# Patient Record
Sex: Male | Born: 1955 | Hispanic: No
Health system: Southern US, Community
[De-identification: ages and names within clinical notes are randomized; demographics above are authoritative.]

## PROBLEM LIST (undated history)

## (undated) DIAGNOSIS — K603 Anal fistula, unspecified: Secondary | ICD-10-CM

## (undated) DIAGNOSIS — R5383 Other fatigue: Secondary | ICD-10-CM

## (undated) DIAGNOSIS — D4959 Neoplasm of unspecified behavior of other genitourinary organ: Secondary | ICD-10-CM

## (undated) DIAGNOSIS — R079 Chest pain, unspecified: Secondary | ICD-10-CM

## (undated) DIAGNOSIS — T7840XA Allergy, unspecified, initial encounter: Secondary | ICD-10-CM

## (undated) DIAGNOSIS — R002 Palpitations: Secondary | ICD-10-CM

## (undated) DIAGNOSIS — K5090 Crohn's disease, unspecified, without complications: Secondary | ICD-10-CM

## (undated) DIAGNOSIS — M1990 Unspecified osteoarthritis, unspecified site: Secondary | ICD-10-CM

## (undated) DIAGNOSIS — I499 Cardiac arrhythmia, unspecified: Secondary | ICD-10-CM

## (undated) DIAGNOSIS — R198 Other specified symptoms and signs involving the digestive system and abdomen: Secondary | ICD-10-CM

## (undated) DIAGNOSIS — E785 Hyperlipidemia, unspecified: Secondary | ICD-10-CM

## (undated) DIAGNOSIS — D509 Iron deficiency anemia, unspecified: Secondary | ICD-10-CM

## (undated) DIAGNOSIS — I10 Essential (primary) hypertension: Secondary | ICD-10-CM

## (undated) DIAGNOSIS — Z9889 Other specified postprocedural states: Secondary | ICD-10-CM

## (undated) DIAGNOSIS — H269 Unspecified cataract: Secondary | ICD-10-CM

## (undated) DIAGNOSIS — R21 Rash and other nonspecific skin eruption: Secondary | ICD-10-CM

## (undated) DIAGNOSIS — K611 Rectal abscess: Secondary | ICD-10-CM

## (undated) DIAGNOSIS — K529 Noninfective gastroenteritis and colitis, unspecified: Secondary | ICD-10-CM

## (undated) DIAGNOSIS — K219 Gastro-esophageal reflux disease without esophagitis: Secondary | ICD-10-CM

## (undated) DIAGNOSIS — K605 Anorectal fistula, unspecified: Secondary | ICD-10-CM

## (undated) DIAGNOSIS — L259 Unspecified contact dermatitis, unspecified cause: Secondary | ICD-10-CM

## (undated) DIAGNOSIS — Z23 Encounter for immunization: Secondary | ICD-10-CM

## (undated) DIAGNOSIS — L739 Follicular disorder, unspecified: Secondary | ICD-10-CM

## (undated) DIAGNOSIS — M279 Disease of jaws, unspecified: Secondary | ICD-10-CM

## (undated) DIAGNOSIS — I4891 Unspecified atrial fibrillation: Secondary | ICD-10-CM

## (undated) HISTORY — DX: Other fatigue: R53.83

## (undated) HISTORY — DX: Anorectal fistula: K60.5

## (undated) HISTORY — PX: UPPER GASTROINTESTINAL ENDOSCOPY: SHX188

## (undated) HISTORY — DX: Anal fistula: K60.3

## (undated) HISTORY — DX: Disease of jaws, unspecified: M27.9

## (undated) HISTORY — DX: Allergy, unspecified, initial encounter: T78.40XA

## (undated) HISTORY — DX: Rash and other nonspecific skin eruption: R21

## (undated) HISTORY — DX: Palpitations: R00.2

## (undated) HISTORY — DX: Essential (primary) hypertension: I10

## (undated) HISTORY — DX: Noninfective gastroenteritis and colitis, unspecified: K52.9

## (undated) HISTORY — DX: Neoplasm of unspecified behavior of other genitourinary organ: D49.59

## (undated) HISTORY — PX: COLONOSCOPY: SHX174

## (undated) HISTORY — DX: Gastro-esophageal reflux disease without esophagitis: K21.9

## (undated) HISTORY — DX: Iron deficiency anemia, unspecified: D50.9

## (undated) HISTORY — DX: Unspecified atrial fibrillation: I48.91

## (undated) HISTORY — DX: Crohn's disease, unspecified, without complications: K50.90

## (undated) HISTORY — DX: Anal fistula, unspecified: K60.30

## (undated) HISTORY — DX: Anorectal fistula, unspecified: K60.50

## (undated) HISTORY — DX: Rectal abscess: K61.1

## (undated) HISTORY — DX: Hyperlipidemia, unspecified: E78.5

## (undated) HISTORY — DX: Follicular disorder, unspecified: L73.9

## (undated) HISTORY — DX: Unspecified osteoarthritis, unspecified site: M19.90

## (undated) HISTORY — DX: Other specified postprocedural states: Z98.890

## (undated) HISTORY — DX: Encounter for immunization: Z23

## (undated) HISTORY — DX: Chest pain, unspecified: R07.9

## (undated) HISTORY — DX: Unspecified cataract: H26.9

## (undated) HISTORY — DX: Unspecified contact dermatitis, unspecified cause: L25.9

## (undated) HISTORY — DX: Other specified symptoms and signs involving the digestive system and abdomen: R19.8

---
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
## 2009-11-15 DIAGNOSIS — K5090 Crohn's disease, unspecified, without complications: Secondary | ICD-10-CM

## 2009-11-15 HISTORY — DX: Crohn's disease, unspecified, without complications: K50.90

## 2014-11-15 DIAGNOSIS — I4891 Unspecified atrial fibrillation: Secondary | ICD-10-CM

## 2014-11-15 HISTORY — DX: Unspecified atrial fibrillation: I48.91

## 2015-01-14 ENCOUNTER — Encounter: Payer: Self-pay | Admitting: *Deleted

## 2015-01-14 DIAGNOSIS — I499 Cardiac arrhythmia, unspecified: Secondary | ICD-10-CM

## 2015-01-14 HISTORY — DX: Cardiac arrhythmia, unspecified: I49.9

## 2015-01-15 ENCOUNTER — Other Ambulatory Visit (INDEPENDENT_AMBULATORY_CARE_PROVIDER_SITE_OTHER): Payer: BLUE CROSS/BLUE SHIELD

## 2015-01-15 ENCOUNTER — Encounter: Payer: Self-pay | Admitting: Physician Assistant

## 2015-01-15 ENCOUNTER — Ambulatory Visit (INDEPENDENT_AMBULATORY_CARE_PROVIDER_SITE_OTHER): Payer: BLUE CROSS/BLUE SHIELD | Admitting: Physician Assistant

## 2015-01-15 VITALS — BP 120/70 | HR 72 | Temp 97.4°F | Ht 66.5 in | Wt 205.1 lb

## 2015-01-15 DIAGNOSIS — K50113 Crohn's disease of large intestine with fistula: Secondary | ICD-10-CM

## 2015-01-15 LAB — CBC WITH DIFFERENTIAL/PLATELET
Basophils Absolute: 0 10*3/uL (ref 0.0–0.1)
Basophils Relative: 0.2 % (ref 0.0–3.0)
Eosinophils Absolute: 0 10*3/uL (ref 0.0–0.7)
Eosinophils Relative: 0 % (ref 0.0–5.0)
HCT: 32.8 % — ABNORMAL LOW (ref 39.0–52.0)
Hemoglobin: 11.2 g/dL — ABNORMAL LOW (ref 13.0–17.0)
Lymphocytes Relative: 9.3 % — ABNORMAL LOW (ref 12.0–46.0)
Lymphs Abs: 1.7 10*3/uL (ref 0.7–4.0)
MCHC: 34 g/dL (ref 30.0–36.0)
MCV: 88.4 fl (ref 78.0–100.0)
Monocytes Absolute: 2 10*3/uL — ABNORMAL HIGH (ref 0.1–1.0)
Monocytes Relative: 11 % (ref 3.0–12.0)
Neutro Abs: 14.5 10*3/uL — ABNORMAL HIGH (ref 1.4–7.7)
Neutrophils Relative %: 79.5 % — ABNORMAL HIGH (ref 43.0–77.0)
Platelets: 462 10*3/uL — ABNORMAL HIGH (ref 150.0–400.0)
RBC: 3.71 Mil/uL — ABNORMAL LOW (ref 4.22–5.81)
RDW: 14 % (ref 11.5–15.5)
WBC: 18.3 10*3/uL (ref 4.0–10.5)

## 2015-01-15 MED ORDER — METRONIDAZOLE 500 MG PO TABS: 500.0000 mg | ORAL_TABLET | Freq: Three times a day (TID) | ORAL | Status: DC

## 2015-01-15 MED ORDER — PREDNISONE 10 MG PO TABS: ORAL_TABLET | ORAL | Status: DC

## 2015-01-15 MED ORDER — CIPROFLOXACIN HCL 500 MG PO TABS: 500.0000 mg | ORAL_TABLET | Freq: Two times a day (BID) | ORAL | Status: DC

## 2015-01-15 NOTE — Patient Instructions (Addendum)
Hunter Edwards has advised that you be on a prednisone taper. The taper instructions are as follows: Starting on 01-15-15 take prednisone 10 mg 4 tabs every AM for 7 days.(01-15-15 to 01-21-15) Then take 3.5 tab x 7 days(01-22-15 to 01-28-15) then 3 tabs x 7 days(01-29-15 to 02-04-15) Then 2.5 tabs x 7 days(02-04-25 to 02-11-15) then 2 tabs x 7 days(02-12-15 to 02-18-15) Then 1.5 tabs x 7 days(02-19-15 to 02-25-15) than 1 tab x 7 days(02-26-15 to 03-04-15) Then 0.5 tab x 7 days(03-05-15 to 03-11-15) then stop  Your physician has requested that you go to the basement for  lab work before leaving today  We have sent the following medications to your pharmacy for you to pick up at your convenience: Flagyl and cipro  You have been scheduled for an MRI at [facility] on 01-16-15. Your appointment time is 9:00pm. Please arrive 15 minutes prior to your appointment time for registration purposes. Please make certain not to have anything to eat or drink 6 hours prior to your test. In addition, if you have any metal in your body, have a pacemaker or defibrillator, please be sure to let your ordering physician know. This test typically takes 45 minutes to 1 hour to complete.  Please discontinue Lialda

## 2015-01-15 NOTE — Progress Notes (Addendum)
Patient ID: Hunter Edwards, male   DOB: 1956/05/13, 59 y.o.   MRN: [id number]    HPI:  Hunter Edwards is a 59 y.o.   male is self referred for evaluation of an exacerbation of Crohn's disease.  Hunter Edwards had a colonoscopy in June 2011 by Dr. Lyndel Safe in [place] due to heme positive stools. 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 Dr. Lyndel Safe recommended that the patient have a surgical consult for an exam under anesthesia with possible biopsies and hemorrhoidectomy. The patient began to have rectal pain and diarrhea in the fall of 2014. He initially saw his primary care provider and was referred to Dr. Elissa Hefty of [facility]. He had approximately 8 months of a mucus-like discharge from his rectum. On 10/19/2013, he had a colonoscopy and it was noted that 5 cm of the terminal ileum were intubated and were normal. Focal erythema and mild friability were noted from 50-52 cm from the anal verge, corresponding to the probable descending colon. Otherwise the colonic mucosa appeared normal, with a normal vascular pattern. The distal rectum, just proximal to the anal verge, was abnormal with 2 separate fistulous tracts identified. Multiple biopsies were obtained of the right colon and left colon and rectum and all specimens were sent to pathology to rule out active Crohn's. Small internal hemorrhoids were noted on retroflexed flu of the rectum. The patient was then seen by Dr. Morton Stall of  surgery at [facility] and sent for an MRI of the pelvis with and without contrast which he had performed on 08/29/2013 he was noted to have a possible small intersphincteric tract near the posterior anal verge without evidence of abscess. Patient was treated with antibiotics for a period of time and felt better Hunter Edwards states that Dr. Morton Stall gave him the option of surgery if he would like to proceed and he  deferred surgery due to the risk of developing scarring from the surgery.Marland Kitchen His Crohn's was maintained on Lialda 2.4 g daily.   More recently around February 22, he began to have rectal pain and diarrhea. He says he was unable to get an appointment with his gastroenterologist and so he was seen at a walk-in clinic in [place] where he was started on Flagyl. Several days later he was seen in the [facility] emergency room with complaints of left lower quadrant abdominal pain diarrhea and blood in the stool along with rectal pain. He had a CT of the abdomen and pelvis that showed bowel wall thickening involving the left colon, greatest in the distal descending colon with pericolonic stranding but no frank perforation, abscess, or pneumoperitoneum. Dilated mildly thick walled sigmoid. No obstruction moderate stool burden on the right colon. Normal appendix. He was started on prednisone 40 mg daily for 5 days and oxycodone 5 mg by mouth every 6 hours when necessary #15 tablets. Cipro was also added to his Flagyl. Patient states he has not used the oxycodone because he prefers not to use pain medications. He subsequently tried to get an appointment with his gastroenterologist and was unable to do so and so has decided to establish care with a different gastroenterologist and presents today. He continues to have diarrhea 2-3 times per day. He is not having any further bleeding he still has some mucousy drainage from the rectum but he states his rectal pain is more severe. He has had no fever,  or chills. He has no nausea or vomiting. He has no night sweats. His appetite has been good but he has lost 15 pounds in the past 2 weeks. At the onset of his diarrhea around February 22, the patient began to experience numerous canker sores through his mouth which he states began to clear when he started Flagyl. He has not had any unusual joint pain, eye pain, or rashes although he states he frequently gets rashes that he  always attributed to environmental allergies.  In addition 2 weeks ago the patient was having palpitations. He saw his cardiologist Dr. Bettina Gavia and has been wearing a Holter monitor. 2 days ago he received a phone call that his heart rate was 180, and he was advised to go to the hospital. Turned to [facility] where he was evaluated by cardiology and started on flecainide with normalization of his rate. He is scheduled to see his cardiologist Dr. Bettina Gavia on March 15. He has discontinued Cipro due to possible interaction with flecainide.  The patient states no one else in his family has inflammatory bowel disease. There is no known family history of colon cancer or colon polyps. His father died in his 24s of gastric cancer.   Past Medical History  Diagnosis Date  . Anal fistula   . Benign hypertension   . Chest pain   . Colitis   . Contact dermatitis   . Crohn's disease   . Disease of jaw   . Neoplasm of prostate   . GERD (gastroesophageal reflux disease)   . Folliculitis   . Fatigue   . Hyperlipidemia   . Need for Tdap vaccination   . Palpitations   . Rash   . Rectal discharge   . History of Holter monitoring     Will be done on Friday, 01/17/15  . Atrial fibrillation 2016    Past Surgical History  Procedure Laterality Date  . Tonsillectomy     Family History  Problem Relation Age of Onset  . Colon cancer Neg Hx   . Colon polyps Neg Hx   . Diabetes Brother   . Kidney disease Neg Hx   . Esophageal cancer Neg Hx   . Heart disease Brother   . Gallbladder disease Neg Hx    History  Substance Use Topics  . Smoking status: Former Smoker -- 1.00 packs/day    Types: Cigarettes    Quit date: 11/15/1996  . Smokeless tobacco: Not on file  . Alcohol Use: 0.0 oz/week    0 Standard drinks or equivalent per week     Comment: Rarely   Current Outpatient Prescriptions  Medication Sig Dispense Refill  . aspirin 81 MG tablet Take 81 mg by mouth daily.    . flecainide  (TAMBOCOR) 100 MG tablet Take 100 mg by mouth 2 (two) times daily.     . metoprolol succinate (TOPROL-XL) 25 MG 24 hr tablet Take 25 mg by mouth 2 (two) times daily.  6  . ranitidine (ZANTAC) 150 MG tablet Take 150 mg by mouth 2 (two) times daily. Take 1 tab daily    . metroNIDAZOLE (FLAGYL) 500 MG tablet Take 1 tablet (500 mg total) by mouth 3 (three) times daily. 30 tablet 0  . predniSONE (DELTASONE) 10 MG tablet Take 4 tabs by mouth daily in AM and as directed 120 tablet 0   No current facility-administered medications for this visit.   Allergies  Allergen Reactions  . Sulfa Antibiotics      Review  of Systems: Gen: Denies any fever, chills, sweats, anorexia, fatigue, weakness, malaise, weight loss, and sleep disorder CV: Denies chest pain, angina, palpitations, syncope, orthopnea, PND, peripheral edema, and claudication. Resp: Denies dyspnea at rest, dyspnea with exercise, cough, sputum, wheezing, coughing up blood, and pleurisy. GI: Denies vomiting blood, jaundice, and fecal incontinence.   Denies dysphagia or odynophagia. GU : Denies urinary burning, blood in urine, urinary frequency, urinary hesitancy, nocturnal urination, and urinary incontinence. MS: Denies joint pain, limitation of movement, and swelling, stiffness, low back pain, extremity pain. Denies muscle weakness, cramps, atrophy.  Derm: Denies rash, itching, dry skin, hives, moles, warts, or unhealing ulcers.  Psych: Denies depression, anxiety, memory loss, suicidal ideation, hallucinations, paranoia, and confusion. Heme: Denies bruising, bleeding, and enlarged lymph nodes. Neuro:  Denies any headaches, dizziness, paresthesias. Endo:  Denies any problems with DM, thyroid, adrenal function  Studies: CT of the abdomen and pelvis done at [facility] on 01/10/2015 showed bowel wall thickening involving the left colon, greatest in the distal descending colon, with pericolonic inflammatory stranding and no frank  perforation. Given the history of Crohn's disease, findings consistent with acute exacerbation of granulomatous colitis.  LAB RESULTS: CBC done at [facility] on 01/10/2015 had a white blood cell count 11.7, hemoglobin 12.3, hematocrit 36.7, platelets 320,000.  Prior Endoscopies:  Colonoscopy 10/19/2013 by Dr. Jaci Lazier showed 5 cm of terminal ileum were intubated and were normal. Focal erythema and mild friability were noted from 50-52 cm from the anal verge, corresponding to the probable descending colon. Otherwise the colonic mucosa appeared normal, with a normal vascular pattern. The distal rectum, just proximal to the anal verge, was abnormal with 2 separate fistulous tracts identified. Multiple biopsies were obtained to the right colon, left colon and rectum and all specimens were sent to pathology to rule out active Crohn's. Small internal hemorrhoids were noted on retroflex view of the rectum.    Physical Exam: BP 120/70 mmHg  Pulse 72  Temp(Src) 97.4 F (36.3 C)  Ht 5' 6.5" (1.689 m)  Wt 205 lb 2 oz (93.044 kg)  BMI 32.62 kg/m2 Constitutional: Pleasant,well-developedmale in no acute distress. HEENT: Normocephalic and atraumatic. Conjunctivae are normal. No scleral icterus.apthous ulcers noted left buccal mucosa Neck supple. No thyromegaly Cardiovascular: Normal rate, regular rhythm.  Pulmonary/chest: Effort normal and breath sounds normal. No wheezing, rales or rhonchi. Abdominal: Soft, nondistended, nontender. Bowel sounds active throughout. There are no masses palpable. No hepatomegaly. Rectal: there is an erythematous fluctuant fistula tract posteriorly with no drainage, DRE exquisitely uncomfortable poseriorly with a thickened palpable area. Extremities: no edema Lymphadenopathy: No cervical adenopathy noted. Neurological: Alert and oriented to person place and time. Skin: Skin is warm and dry. No rashes noted. Psychiatric: Normal mood and affect. Behavior is  normal.  ASSESSMENT AND PLAN: 59 year old male with a history of fistulizing Crohn's disease here with 2 weeks of diarrhea and anal pain. His exam today is concerning for a possible perirectal abscess. A CBC and comprehensive metabolic panel will be obtained, along with a stool pathogen panel, and the patient will be scheduled for an MRI of the pelvis with and without contrast to evaluate for a perirectal abscess. He is allergic to sulfa drugs and is not able to use Cipro at this time as he is on flecainide. If he is noted to have an abscess he will be considered for Augmentin. He will continue Flagyl at this time. He will also be started on a prednisone taper of 10 mg 4 tablets  daily for 7 days then 3-1/2 tablets daily for 7 days then 3 tablets daily for 7 days then 2-1/2 tablets daily for 7 days etc. till complete. A hepatitis B surface antigen, hepatitis B surface antibody, HCV antibody, and TB quantitive fearing cold will be obtained today. A considerable amount of time has been spent with the patient (80 min with >50% being spent face-to-face for counseling) explaining the need for a more aggressive therapy to bring his Crohn's under control. We have discussed biologic therapy with Remicade or Humira. The patient says he would prefer to have Remicade infusions as he is not comfortable injecting himself and his wife is not comfortable injecting him. Pending the findings of his MRI, arrangements will be made for the patient to start Remicade in the near future.    Sunaina Ferrando, Deloris Ping 01/15/2015, 4:36 PM  CC: Spry, Marsh Dolly., MD  Patient seen, examined, and I agree with the above documentation, including the assessment and plan. PYRTLE, JAY M

## 2015-01-16 ENCOUNTER — Other Ambulatory Visit: Payer: BLUE CROSS/BLUE SHIELD

## 2015-01-16 ENCOUNTER — Ambulatory Visit (HOSPITAL_COMMUNITY)
Admission: RE | Admit: 2015-01-16 | Discharge: 2015-01-16 | Disposition: A | Discharge status: Home / Self Care | Payer: BLUE CROSS/BLUE SHIELD | Source: Ambulatory Visit | Attending: Physician Assistant | Admitting: Physician Assistant

## 2015-01-16 ENCOUNTER — Other Ambulatory Visit: Payer: Self-pay

## 2015-01-16 ENCOUNTER — Telehealth: Payer: Self-pay | Admitting: Physician Assistant

## 2015-01-16 ENCOUNTER — Telehealth: Payer: Self-pay | Admitting: *Deleted

## 2015-01-16 DIAGNOSIS — K50113 Crohn's disease of large intestine with fistula: Secondary | ICD-10-CM | POA: Diagnosis present

## 2015-01-16 DIAGNOSIS — D72829 Elevated white blood cell count, unspecified: Secondary | ICD-10-CM

## 2015-01-16 LAB — HEPATITIS B SURFACE ANTIGEN: Hepatitis B Surface Ag: NEGATIVE

## 2015-01-16 LAB — HEPATITIS C ANTIBODY: HCV Ab: NEGATIVE

## 2015-01-16 LAB — HEPATITIS B SURFACE ANTIBODY,QUALITATIVE: Hep B S Ab: NEGATIVE

## 2015-01-16 MED ORDER — GADOBENATE DIMEGLUMINE 529 MG/ML IV SOLN
20.0000 mL | Freq: Once | INTRAVENOUS | Status: AC | PRN
  Administered 2015-01-16: 18 mL via INTRAVENOUS

## 2015-01-16 NOTE — Telephone Encounter (Signed)
Received records from [facility], sent to Dr. Charlesetta Ivory. 01/16/15/ss

## 2015-01-16 NOTE — Telephone Encounter (Signed)
Called over to radiology to verify pt appt time for the MRI. They state that pt is down for 9:00pm and needs to arrive at 8:45pm. Radiology states that patient needs to go to [address] [facility]) for the MRI. Patient was notified and verbalized understanding. Pt to call back with any questions or concerns.

## 2015-01-17 ENCOUNTER — Other Ambulatory Visit: Payer: Self-pay

## 2015-01-17 ENCOUNTER — Telehealth: Payer: Self-pay

## 2015-01-17 ENCOUNTER — Other Ambulatory Visit (INDEPENDENT_AMBULATORY_CARE_PROVIDER_SITE_OTHER): Payer: BLUE CROSS/BLUE SHIELD

## 2015-01-17 ENCOUNTER — Telehealth: Payer: Self-pay | Admitting: Physician Assistant

## 2015-01-17 DIAGNOSIS — D72829 Elevated white blood cell count, unspecified: Secondary | ICD-10-CM

## 2015-01-17 DIAGNOSIS — K50113 Crohn's disease of large intestine with fistula: Secondary | ICD-10-CM

## 2015-01-17 LAB — CBC WITH DIFFERENTIAL/PLATELET
Basophils Absolute: 0 10*3/uL (ref 0.0–0.1)
Basophils Relative: 0 % (ref 0.0–3.0)
Eosinophils Absolute: 0 10*3/uL (ref 0.0–0.7)
Eosinophils Relative: 0.1 % (ref 0.0–5.0)
HCT: 31.7 % — ABNORMAL LOW (ref 39.0–52.0)
Hemoglobin: 10.7 g/dL — ABNORMAL LOW (ref 13.0–17.0)
Lymphocytes Relative: 8.6 % — ABNORMAL LOW (ref 12.0–46.0)
Lymphs Abs: 1.5 10*3/uL (ref 0.7–4.0)
MCHC: 33.8 g/dL (ref 30.0–36.0)
MCV: 88 fl (ref 78.0–100.0)
Monocytes Absolute: 1.7 10*3/uL — ABNORMAL HIGH (ref 0.1–1.0)
Monocytes Relative: 9.7 % (ref 3.0–12.0)
Neutro Abs: 14.5 10*3/uL — ABNORMAL HIGH (ref 1.4–7.7)
Neutrophils Relative %: 81.6 % — ABNORMAL HIGH (ref 43.0–77.0)
Platelets: 463 10*3/uL — ABNORMAL HIGH (ref 150.0–400.0)
RBC: 3.61 Mil/uL — ABNORMAL LOW (ref 4.22–5.81)
RDW: 14.2 % (ref 11.5–15.5)
WBC: 17.8 10*3/uL — ABNORMAL HIGH (ref 4.0–10.5)

## 2015-01-17 LAB — COMPREHENSIVE METABOLIC PANEL
ALT: 10 U/L (ref 0–53)
AST: 8 U/L (ref 0–37)
Albumin: 2.7 g/dL — ABNORMAL LOW (ref 3.5–5.2)
Alkaline Phosphatase: 53 U/L (ref 39–117)
BUN: 13 mg/dL (ref 6–23)
CO2: 31 mEq/L (ref 19–32)
Calcium: 8.1 mg/dL — ABNORMAL LOW (ref 8.4–10.5)
Chloride: 97 mEq/L (ref 96–112)
Creatinine, Ser: 1.02 mg/dL (ref 0.40–1.50)
GFR: 79.29 mL/min (ref >60.00)
Glucose, Bld: 127 mg/dL — ABNORMAL HIGH (ref 70–99)
Potassium: 3.3 mEq/L — ABNORMAL LOW (ref 3.5–5.1)
Sodium: 133 mEq/L — ABNORMAL LOW (ref 135–145)
Total Bilirubin: 0.5 mg/dL (ref 0.2–1.2)
Total Protein: 5.9 g/dL — ABNORMAL LOW (ref 6.0–8.3)

## 2015-01-17 LAB — QUANTIFERON TB GOLD ASSAY (BLOOD)
Mitogen value: 0.06 IU/mL
Quantiferon Nil Value: 0.04 IU/mL
Quantiferon Tb Ag Minus Nil Value: 0 IU/mL
TB Ag value: 0.03 IU/mL

## 2015-01-17 MED ORDER — AMOXICILLIN-POT CLAVULANATE 875-125 MG PO TABS: 1.0000 | ORAL_TABLET | Freq: Two times a day (BID) | ORAL | Status: DC

## 2015-01-17 MED ORDER — SACCHAROMYCES BOULARDII 250 MG PO CAPS: 250.0000 mg | ORAL_CAPSULE | Freq: Two times a day (BID) | ORAL | Status: DC

## 2015-01-17 NOTE — Telephone Encounter (Signed)
Pt aware, order in epic. 

## 2015-01-17 NOTE — Telephone Encounter (Signed)
Lori Hvozdovic, PA-C spoke with pt.

## 2015-01-17 NOTE — Telephone Encounter (Signed)
-----   Message from [employer], PA-C sent at 01/17/2015  1:21 PM EST ----- Please have pt have cbc next Tues or Wed.

## 2015-01-17 NOTE — Addendum Note (Signed)
Addended by: Jerene Bears on: 01/17/2015 09:02 AM   Modules accepted: Level of Service

## 2015-01-20 ENCOUNTER — Other Ambulatory Visit: Payer: Self-pay | Admitting: *Deleted

## 2015-01-20 ENCOUNTER — Telehealth: Payer: Self-pay | Admitting: Physician Assistant

## 2015-01-20 ENCOUNTER — Encounter: Payer: Self-pay | Admitting: Physician Assistant

## 2015-01-20 NOTE — Telephone Encounter (Signed)
Have him continue his currents antibiotics and prednisone.  Have him continue florastor. He should have a cbc today or tomorrow if not already ordered.

## 2015-01-20 NOTE — Telephone Encounter (Signed)
Patient calling to let us know he is still having diarrhea. He also states his appointment with surgeon was moved from today to Wednesday due to change in surgeon to Dr. Marcello Moores.

## 2015-01-20 NOTE — Telephone Encounter (Signed)
Spoke with patient and he is coming on Wednesday for labs and his first hepatitis B injection.

## 2015-01-20 NOTE — Telephone Encounter (Signed)
Error

## 2015-01-21 LAB — OTHER SOLSTAS TEST: Miscellaneous Test: 65008

## 2015-01-22 ENCOUNTER — Other Ambulatory Visit: Payer: Self-pay | Admitting: *Deleted

## 2015-01-22 ENCOUNTER — Telehealth: Payer: Self-pay

## 2015-01-22 ENCOUNTER — Encounter (HOSPITAL_BASED_OUTPATIENT_CLINIC_OR_DEPARTMENT_OTHER): Payer: Self-pay | Admitting: *Deleted

## 2015-01-22 ENCOUNTER — Other Ambulatory Visit (INDEPENDENT_AMBULATORY_CARE_PROVIDER_SITE_OTHER): Payer: Self-pay | Admitting: General Surgery

## 2015-01-22 ENCOUNTER — Other Ambulatory Visit (INDEPENDENT_AMBULATORY_CARE_PROVIDER_SITE_OTHER): Payer: BLUE CROSS/BLUE SHIELD

## 2015-01-22 ENCOUNTER — Ambulatory Visit (INDEPENDENT_AMBULATORY_CARE_PROVIDER_SITE_OTHER): Payer: BLUE CROSS/BLUE SHIELD | Admitting: Internal Medicine

## 2015-01-22 DIAGNOSIS — Z23 Encounter for immunization: Secondary | ICD-10-CM

## 2015-01-22 DIAGNOSIS — K50113 Crohn's disease of large intestine with fistula: Secondary | ICD-10-CM

## 2015-01-22 DIAGNOSIS — K50119 Crohn's disease of large intestine with unspecified complications: Secondary | ICD-10-CM

## 2015-01-22 LAB — CBC WITH DIFFERENTIAL/PLATELET
Basophils Absolute: 0 10*3/uL (ref 0.0–0.1)
Basophils Relative: 0.2 % (ref 0.0–3.0)
Eosinophils Absolute: 0 10*3/uL (ref 0.0–0.7)
Eosinophils Relative: 0.1 % (ref 0.0–5.0)
HCT: 28.3 % — ABNORMAL LOW (ref 39.0–52.0)
Hemoglobin: 9.6 g/dL — ABNORMAL LOW (ref 13.0–17.0)
Lymphocytes Relative: 12.5 % (ref 12.0–46.0)
Lymphs Abs: 1.6 10*3/uL (ref 0.7–4.0)
MCHC: 34.1 g/dL (ref 30.0–36.0)
MCV: 88.9 fl (ref 78.0–100.0)
Monocytes Absolute: 1.5 10*3/uL — ABNORMAL HIGH (ref 0.1–1.0)
Monocytes Relative: 11.7 % (ref 3.0–12.0)
Neutro Abs: 9.9 10*3/uL — ABNORMAL HIGH (ref 1.4–7.7)
Neutrophils Relative %: 75.5 % (ref 43.0–77.0)
Platelets: 453 10*3/uL — ABNORMAL HIGH (ref 150.0–400.0)
RBC: 3.18 Mil/uL — ABNORMAL LOW (ref 4.22–5.81)
RDW: 14.6 % (ref 11.5–15.5)
WBC: 13.1 10*3/uL — ABNORMAL HIGH (ref 4.0–10.5)

## 2015-01-22 NOTE — H&P (Signed)
Hunter Edwards 01/22/2015 11:14 AM Location: [facility] Patient #: [id number] DOB: 08/20/1956 Married / Language: Undefined / Race: Undefined Male History of Present Illness [name] MD; 12/22/7822 12:02 PM) Patient words: New Patient.  The patient is a 59 year old male who presents with anal itching. Patient presents to the office with an anorectal fistula, which she has known about for quite some time. He was diagnosed with Crohn's disease approximately 2 years ago. Recently he had his first flare of the disease with significant diarrhea. This is causing his fistula to become more active. He is currently on prednisone Flagyl and Augmentin for his Crohn's disease. He also was recently diagnosed with paroxysmal atrial fibrillation and is on flecainide and metoprolol for this. He complains of significant anal rectal pain with bowel movements as well as continued drainage from his fistula. He also complains of pain from his hemorrhoid disease and occasional prolapse of his hemorrhoids. Per pt, his last colonoscopy by Dr. Hilarie Fredrickson showed patchy colonic Crohn's. Per pt, recent CT scan was negative for any major small bowel inflammation. Other Problems Sharman Crate, LPN; 12/19/5359 44:31 AM) Atrial Fibrillation Back Pain Chest pain Crohn's Disease Gastroesophageal Reflux Disease Hemorrhoids High blood pressure Hypercholesterolemia Other disease, cancer, significant illness  Past Surgical History Sharman Crate, LPN; 03/18/85 76:19 AM) Tonsillectomy  Diagnostic Studies History Sharman Crate, LPN; 5/0/9326 71:24 AM) Colonoscopy 1-5 years ago  Allergies Sharman Crate, LPN; 03/22/997 33:82 AM) Ignacia Bayley Antibiotics  Medication History Sharman Crate, LPN; 5/0/5397 67:34 AM) Aspirin Low Strength (81MG Tablet Chewable, Oral daily) Active. Amoxicillin-Pot Clavulanate (875-125MG Tablet, Oral) Active. Flagyl (500MG Tablet, Oral) Active. Tambocor (100MG Tablet,  Oral daily) Active. Toprol XL (25MG Tablet ER 24HR, Oral daily) Active. PredniSONE (10MG Tablet, Oral) Active. Probiotic (Oral daily) Active.  Social History Sharman Crate, LPN; 11/23/3788 24:09 AM) Caffeine use Coffee, Tea. No alcohol use No drug use Tobacco use Former smoker.  Family History Sharman Crate, LPN; 05/17/5328 92:42 AM) Cancer Father. Diabetes Mellitus Brother. Heart Disease Brother. Hypertension Brother, Mother.     Review of Systems Clarise Cruz [facility] LPN; 04/22/3418 62:22 AM) General Present- Fatigue and Weight Loss. Not Present- Appetite Loss, Chills, Fever, Night Sweats and Weight Gain. Skin Present- Dryness. Not Present- Change in Wart/Mole, Hives, Jaundice, New Lesions, Non-Healing Wounds, Rash and Ulcer. HEENT Present- Hoarseness, Visual Disturbances and Wears glasses/contact lenses. Not Present- Earache, Hearing Loss, Nose Bleed, Oral Ulcers, Ringing in the Ears, Seasonal Allergies, Sinus Pain, Sore Throat and Yellow Eyes. Respiratory Present- Snoring. Not Present- Bloody sputum, Chronic Cough, Difficulty Breathing and Wheezing. Breast Not Present- Breast Mass, Breast Pain, Nipple Discharge and Skin Changes. Cardiovascular Not Present- Chest Pain, Difficulty Breathing Lying Down, Leg Cramps, Palpitations, Rapid Heart Rate, Shortness of Breath and Swelling of Extremities. Gastrointestinal Present- Abdominal Pain, Bloody Stool, Change in Bowel Habits, Chronic diarrhea, Excessive gas, Hemorrhoids, Nausea and Rectal Pain. Not Present- Bloating, Constipation, Difficulty Swallowing, Gets full quickly at meals, Indigestion and Vomiting. Male Genitourinary Not Present- Blood in Urine, Change in Urinary Stream, Frequency, Impotence, Nocturia, Painful Urination, Urgency and Urine Leakage.  Vitals Clarise Cruz [facility] LPN; 07/23/9891 11:94 AM) 01/22/2015 11:14 AM Weight: 197.13 lb Height: 66in Body Surface Area: 2.04 m Body Mass Index: 31.82 kg/m Temp.: 98.75F(Oral)   Pulse: 69 (Regular)  BP: 118/60 (Sitting, Left Arm, Standard)     Physical Exam [name] MD; 11/22/4079 11:57 AM)  General Mental Status-Alert. General Appearance-Consistent with stated age. Hydration-Well hydrated. Voice-Normal.  Head and Neck Head-normocephalic, atraumatic with no lesions or palpable  masses. Trachea-midline. Thyroid Gland Characteristics - normal size and consistency.  Eye Eyeball - Bilateral-Extraocular movements intact. Sclera/Conjunctiva - Bilateral-No scleral icterus.  Chest and Lung Exam Chest and lung exam reveals -quiet, even and easy respiratory effort with no use of accessory muscles and on auscultation, normal breath sounds, no adventitious sounds and normal vocal resonance. Inspection Chest Wall - Normal. Back - normal.  Cardiovascular Cardiovascular examination reveals -normal heart sounds, regular rate and rhythm with no murmurs and normal pedal pulses bilaterally.  Abdomen Inspection Inspection of the abdomen reveals - No Hernias. Palpation/Percussion Palpation and Percussion of the abdomen reveal - Soft, Non Tender, No Rebound tenderness, No Rigidity (guarding) and No hepatosplenomegaly. Auscultation Auscultation of the abdomen reveals - Bowel sounds normal.  Rectal Anorectal Exam External - Note: Purulence draining from posterior midline externally. Consistent with anorectal fistula.  Neurologic Neurologic evaluation reveals -alert and oriented x 3 with no impairment of recent or remote memory. Mental Status-Normal.  Musculoskeletal Global Assessment -Note:no gross deformities.  Normal Exam - Left-Upper Extremity Strength Normal and Lower Extremity Strength Normal. Normal Exam - Right-Upper Extremity Strength Normal and Lower Extremity Strength Normal.    Assessment & Plan [name] MD; 07/18/4267 11:56 AM)  PERIANAL CROHN'S DISEASE, WITH FISTULA (555.1  K50.113) Impression:  Patient with a long-standing history of anorectal fistula. He also has a diagnosis of colonic Crohn's disease which has been present for approximately 2 years. He has just encountered his first flare of the disease. He is currently on antibiotics and prednisone. He is scheduled to start Remicade tomorrow. On exam he has a fistula with quite a bit of scar tissue posteriorly. I have recommended seton placement during the Remicade. We will then discuss further treatment once his flare has calmed down.

## 2015-01-22 NOTE — Telephone Encounter (Signed)
-----   Message from Algernon Huxley, RN sent at 01/22/2015  9:47 AM EST ----- Regarding: FW: Remicade Lottie Sigman,  This pt is to see Dr. Marcello Moores with [facility] today at 11:10am. Depending on what happens at this visit the pt may or may not need to receive 1st Remicade at [facility] tomorrow. Dr. Hilarie Fredrickson is supposed to call [facility] and speak with Dr. Marcello Moores to see what we need to do regarding Remicade. He knows I will be gone this afternoon and I told him I would let you know what is going on. Pyrtle will need to let us know what to do regarding the Remicade.  Thanks, Vaughan Basta  ----- Message -----    From: Jerene Bears, MD    Sent: 01/20/2015   2:49 PM      To: Algernon Huxley, RN Subject: RE: Remicade                                   If possible let's see what Dr. Manon Hilding opinion is on Wednesday and cancel late that day if she thinks intervention is needed. We may have to call the office at 16:30 on 01/22/2015 to get the note to make a decision.  I also can call her directly if you remind me late Wed Thanks Jay  ----- Message -----    From: Algernon Huxley, RN    Sent: 01/20/2015   2:23 PM      To: Jerene Bears, MD Subject: Remicade                                       Dr. Hilarie Fredrickson,  Dr. Ninfa Linden deferred this pt to Dr. Marcello Moores on Wed because he thought the pt needed more attention than he was comfortable with. His 1st Remicade is scheduled for Thursday at [facility]. Do you want this moved back a week? Please advise.  Thanks, [employer]

## 2015-01-22 NOTE — Progress Notes (Signed)
To [facility] at 1130-Istat on arrival-request for prior Ekg at [facility] and [place] after [place]-will take Tambocor and metoprolol with water in am.

## 2015-01-22 NOTE — Telephone Encounter (Signed)
Per Dr. Hilarie Fredrickson ok to proceed with Remicade as ordered.  Patient notified and he agrees to plan.

## 2015-01-22 NOTE — Progress Notes (Signed)
   01/22/15 1518  OBSTRUCTIVE SLEEP APNEA  Have you ever been diagnosed with sleep apnea through a sleep study? No  Do you snore loudly (loud enough to be heard through closed doors)?  0  Do you often feel tired, fatigued, or sleepy during the daytime? 0  Has anyone observed you stop breathing during your sleep? 0  Do you have, or are you being treated for high blood pressure? 1  BMI more than 35 kg/m2? 1  Age over 60 years old? 1  Neck circumference greater than 40 cm/16 inches? 0  Gender: 1  Obstructive Sleep Apnea Score 4

## 2015-01-23 ENCOUNTER — Encounter (HOSPITAL_COMMUNITY)
Admission: RE | Admit: 2015-01-23 | Discharge: 2015-01-23 | Disposition: A | Discharge status: Home / Self Care | Payer: BLUE CROSS/BLUE SHIELD | Source: Ambulatory Visit | Attending: Internal Medicine | Admitting: Internal Medicine

## 2015-01-23 VITALS — BP 98/63 | HR 64 | Temp 98.1°F | Resp 20 | Ht 66.5 in | Wt 197.0 lb

## 2015-01-23 DIAGNOSIS — K50113 Crohn's disease of large intestine with fistula: Secondary | ICD-10-CM | POA: Insufficient documentation

## 2015-01-23 MED ORDER — INFLIXIMAB 100 MG IV SOLR
5.0000 mg/kg | Freq: Once | INTRAVENOUS | Status: AC
Start: 2015-01-23 — End: 2015-01-23
  Administered 2015-01-23: 400 mg via INTRAVENOUS
  Filled 2015-01-23 (×2): qty 40

## 2015-01-23 MED ORDER — DIPHENHYDRAMINE HCL 25 MG PO CAPS
ORAL_CAPSULE | ORAL | Status: AC
  Administered 2015-01-23: 50 mg
  Filled 2015-01-23: qty 2

## 2015-01-23 MED ORDER — SODIUM CHLORIDE 0.9 % IV SOLN
Freq: Once | INTRAVENOUS | Status: AC
  Administered 2015-01-23: 10:00:00 via INTRAVENOUS

## 2015-01-23 MED ORDER — DIPHENHYDRAMINE HCL 25 MG PO CAPS: 50.0000 mg | ORAL_CAPSULE | Freq: Once | ORAL | Status: DC

## 2015-01-23 MED ORDER — ACETAMINOPHEN 325 MG PO TABS: 650.0000 mg | ORAL_TABLET | Freq: Once | ORAL | Status: DC

## 2015-01-23 MED ORDER — ACETAMINOPHEN 325 MG PO TABS
ORAL_TABLET | ORAL | Status: AC
  Administered 2015-01-23: 650 mg
  Filled 2015-01-23: qty 2

## 2015-01-23 MED ORDER — SODIUM CHLORIDE 0.9 % IV SOLN: Freq: Once | INTRAVENOUS | Status: DC

## 2015-01-23 NOTE — Progress Notes (Signed)
Patient ID: Hunter Edwards, male   DOB: 1956-03-25, 59 y.o.   MRN: [id number] Pt here for viral hep vaccine No MD Seen

## 2015-01-23 NOTE — Progress Notes (Signed)
Unable to chart titrations in EPIC for Remicade. Remicade was started at 62ml/hr and increased per protocol every 15 minutes until maximum rate of 259ml/hr was reached.

## 2015-01-23 NOTE — Discharge Instructions (Signed)
Infliximab injection °What is this medicine? °INFLIXIMAB (in FLIX i mab) is used to treat Crohn's disease and ulcerative colitis. It is also used to treat ankylosing spondylitis, psoriasis, and some forms of arthritis. °This medicine may be used for other purposes; ask your health care provider or pharmacist if you have questions. °COMMON BRAND NAME(S): Remicade °What should I tell my health care provider before I take this medicine? °They need to know if you have any of these conditions: °-diabetes °-exposure to tuberculosis °-heart failure °-hepatitis or liver disease °-immune system problems °-infection °-lung or breathing disease, like COPD °-multiple sclerosis °-current or past resident of [place] °-seizure disorder °-an unusual or allergic reaction to infliximab, mouse proteins, other medicines, foods, dyes, or preservatives °-pregnant or trying to get pregnant °-breast-feeding °How should I use this medicine? °This medicine is for injection into a vein. It is usually given by a health care professional in a hospital or clinic setting. °A special MedGuide will be given to you by the pharmacist with each prescription and refill. Be sure to read this information carefully each time. °Talk to your pediatrician regarding the use of this medicine in children. Special care may be needed. °Overdosage: If you think you have taken too much of this medicine contact a poison control center or emergency room at once. °NOTE: This medicine is only for you. Do not share this medicine with others. °What if I miss a dose? °It is important not to miss your dose. Call your doctor or health care professional if you are unable to keep an appointment. °What may interact with this medicine? °Do not take this medicine with any of the following medications: °-anakinra °-rilonacept °This medicine may also interact with the following medications: °-vaccines °This list may not describe all possible interactions.  Give your health care provider a list of all the medicines, herbs, non-prescription drugs, or dietary supplements you use. Also tell them if you smoke, drink alcohol, or use illegal drugs. Some items may interact with your medicine. °What should I watch for while using this medicine? °Visit your doctor or health care professional for regular checks on your progress. °If you get a cold or other infection while receiving this medicine, call your doctor or health care professional. Do not treat yourself. This medicine may decrease your body's ability to fight infections. Before beginning therapy, your doctor may do a test to see if you have been exposed to tuberculosis. °This medicine may make the symptoms of heart failure worse in some patients. If you notice symptoms such as increased shortness of breath or swelling of the ankles or legs, contact your health care provider right away. °If you are going to have surgery or dental work, tell your health care professional or dentist that you have received this medicine. °If you take this medicine for plaque psoriasis, stay out of the sun. If you cannot avoid being in the sun, wear protective clothing and use sunscreen. Do not use sun lamps or tanning beds/booths. °What side effects may I notice from receiving this medicine? °Side effects that you should report to your doctor or health care professional as soon as possible: °-allergic reactions like skin rash, itching or hives, swelling of the face, lips, or tongue °-chest pain °-fever or chills, usually related to the infusion °-muscle or joint pain °-red, scaly patches or raised bumps on the skin °-signs of infection - fever or chills, cough, sore throat, pain or difficulty passing urine °-swollen lymph nodes   in the neck, underarm, or groin areas °-unexplained weight loss °-unusual bleeding or bruising °-unusually weak or tired °-yellowing of the eyes or skin °Side effects that usually do not require medical attention  (report to your doctor or health care professional if they continue or are bothersome): °-headache °-heartburn or stomach pain °-nausea, vomiting °This list may not describe all possible side effects. Call your doctor for medical advice about side effects. You may report side effects to FDA at 1-800-FDA-1088. °Where should I keep my medicine? °This drug is given in a hospital or clinic and will not be stored at home. °NOTE: This sheet is a summary. It may not cover all possible information. If you have questions about this medicine, talk to your doctor, pharmacist, or health care provider. °© 2015, [employer]. (2008-06-19 10:26:02) ° °

## 2015-01-24 ENCOUNTER — Ambulatory Visit (HOSPITAL_BASED_OUTPATIENT_CLINIC_OR_DEPARTMENT_OTHER)
Admission: RE | Admit: 2015-01-24 | Discharge: 2015-01-24 | Disposition: A | Discharge status: Home / Self Care | Payer: BLUE CROSS/BLUE SHIELD | Source: Ambulatory Visit | Attending: General Surgery | Admitting: General Surgery

## 2015-01-24 ENCOUNTER — Encounter (HOSPITAL_BASED_OUTPATIENT_CLINIC_OR_DEPARTMENT_OTHER): Payer: Self-pay | Admitting: *Deleted

## 2015-01-24 ENCOUNTER — Ambulatory Visit (HOSPITAL_BASED_OUTPATIENT_CLINIC_OR_DEPARTMENT_OTHER): Payer: BLUE CROSS/BLUE SHIELD | Admitting: Anesthesiology

## 2015-01-24 ENCOUNTER — Encounter (HOSPITAL_BASED_OUTPATIENT_CLINIC_OR_DEPARTMENT_OTHER)
Admission: RE | Disposition: A | Discharge status: Home / Self Care | Payer: Self-pay | Source: Ambulatory Visit | Attending: General Surgery

## 2015-01-24 ENCOUNTER — Other Ambulatory Visit: Payer: Self-pay

## 2015-01-24 DIAGNOSIS — I4891 Unspecified atrial fibrillation: Secondary | ICD-10-CM | POA: Insufficient documentation

## 2015-01-24 DIAGNOSIS — I10 Essential (primary) hypertension: Secondary | ICD-10-CM | POA: Insufficient documentation

## 2015-01-24 DIAGNOSIS — K605 Anorectal fistula: Secondary | ICD-10-CM | POA: Diagnosis present

## 2015-01-24 DIAGNOSIS — K648 Other hemorrhoids: Secondary | ICD-10-CM | POA: Diagnosis not present

## 2015-01-24 DIAGNOSIS — K219 Gastro-esophageal reflux disease without esophagitis: Secondary | ICD-10-CM | POA: Insufficient documentation

## 2015-01-24 DIAGNOSIS — Z882 Allergy status to sulfonamides status: Secondary | ICD-10-CM | POA: Insufficient documentation

## 2015-01-24 DIAGNOSIS — Z8719 Personal history of other diseases of the digestive system: Secondary | ICD-10-CM | POA: Insufficient documentation

## 2015-01-24 DIAGNOSIS — E780 Pure hypercholesterolemia: Secondary | ICD-10-CM | POA: Insufficient documentation

## 2015-01-24 DIAGNOSIS — Z7982 Long term (current) use of aspirin: Secondary | ICD-10-CM | POA: Diagnosis not present

## 2015-01-24 HISTORY — PX: ANAL FISTULECTOMY: SHX1139

## 2015-01-24 HISTORY — PX: RECTAL EXAM UNDER ANESTHESIA: SHX6399

## 2015-01-24 HISTORY — DX: Cardiac arrhythmia, unspecified: I49.9

## 2015-01-24 LAB — POCT I-STAT 4, (NA,K, GLUC, HGB,HCT)
Glucose, Bld: 81 mg/dL (ref 70–99)
Glucose, Bld: 82 mg/dL (ref 70–99)
HCT: 26 % — ABNORMAL LOW (ref 39.0–52.0)
HCT: 28 % — ABNORMAL LOW (ref 39.0–52.0)
Hemoglobin: 8.8 g/dL — ABNORMAL LOW (ref 13.0–17.0)
Hemoglobin: 9.5 g/dL — ABNORMAL LOW (ref 13.0–17.0)
Potassium: 3 mmol/L — ABNORMAL LOW (ref 3.5–5.1)
Potassium: 3 mmol/L — ABNORMAL LOW (ref 3.5–5.1)
Sodium: 140 mmol/L (ref 135–145)
Sodium: 140 mmol/L (ref 135–145)

## 2015-01-24 SURGERY — EXAM UNDER ANESTHESIA, RECTUM
Anesthesia: Monitor Anesthesia Care | Site: Rectum

## 2015-01-24 MED ORDER — OXYCODONE-ACETAMINOPHEN 5-325 MG PO TABS: 1.0000 | ORAL_TABLET | ORAL | Status: DC | PRN

## 2015-01-24 MED ORDER — FENTANYL CITRATE 0.05 MG/ML IJ SOLN
INTRAMUSCULAR | Status: AC
  Filled 2015-01-24: qty 6

## 2015-01-24 MED ORDER — ACETAMINOPHEN 650 MG RE SUPP
650.0000 mg | RECTAL | Status: DC | PRN
  Filled 2015-01-24: qty 1

## 2015-01-24 MED ORDER — BUPIVACAINE-EPINEPHRINE 0.5% -1:200000 IJ SOLN
INTRAMUSCULAR | Status: DC | PRN
  Administered 2015-01-24: 20 mL

## 2015-01-24 MED ORDER — KETOROLAC TROMETHAMINE 30 MG/ML IJ SOLN
INTRAMUSCULAR | Status: DC | PRN
  Administered 2015-01-24: 30 mg via INTRAVENOUS

## 2015-01-24 MED ORDER — OXYCODONE HCL 5 MG PO TABS
5.0000 mg | ORAL_TABLET | ORAL | Status: DC | PRN
  Filled 2015-01-24: qty 2

## 2015-01-24 MED ORDER — FENTANYL CITRATE 0.05 MG/ML IJ SOLN
25.0000 ug | INTRAMUSCULAR | Status: DC | PRN
  Filled 2015-01-24: qty 1

## 2015-01-24 MED ORDER — ACETAMINOPHEN 325 MG PO TABS
650.0000 mg | ORAL_TABLET | ORAL | Status: DC | PRN
  Filled 2015-01-24: qty 2

## 2015-01-24 MED ORDER — PROPOFOL 10 MG/ML IV EMUL
INTRAVENOUS | Status: DC | PRN
  Administered 2015-01-24: 140 ug/kg/min via INTRAVENOUS

## 2015-01-24 MED ORDER — LACTATED RINGERS IV SOLN
INTRAVENOUS | Status: DC
  Administered 2015-01-24 (×2): via INTRAVENOUS
  Filled 2015-01-24: qty 1000

## 2015-01-24 MED ORDER — LIDOCAINE HCL (CARDIAC) 20 MG/ML IV SOLN
INTRAVENOUS | Status: DC | PRN
  Administered 2015-01-24: 40 mg via INTRAVENOUS

## 2015-01-24 MED ORDER — LACTATED RINGERS IV SOLN: INTRAVENOUS | Status: DC | PRN

## 2015-01-24 MED ORDER — PROMETHAZINE HCL 25 MG/ML IJ SOLN
6.2500 mg | INTRAMUSCULAR | Status: DC | PRN
  Filled 2015-01-24: qty 1

## 2015-01-24 MED ORDER — DEXAMETHASONE SODIUM PHOSPHATE 4 MG/ML IJ SOLN
INTRAMUSCULAR | Status: DC | PRN
  Administered 2015-01-24: 4 mg via INTRAVENOUS

## 2015-01-24 MED ORDER — MEPERIDINE HCL 25 MG/ML IJ SOLN
6.2500 mg | INTRAMUSCULAR | Status: DC | PRN
  Filled 2015-01-24: qty 1

## 2015-01-24 MED ORDER — LIDOCAINE 5 % EX OINT
TOPICAL_OINTMENT | CUTANEOUS | Status: DC | PRN
  Administered 2015-01-24: 1

## 2015-01-24 MED ORDER — LACTATED RINGERS IV SOLN
INTRAVENOUS | Status: DC
  Filled 2015-01-24: qty 1000

## 2015-01-24 MED ORDER — MIDAZOLAM HCL 2 MG/2ML IJ SOLN
INTRAMUSCULAR | Status: AC
  Filled 2015-01-24: qty 4

## 2015-01-24 MED ORDER — FENTANYL CITRATE 0.05 MG/ML IJ SOLN
INTRAMUSCULAR | Status: DC | PRN
  Administered 2015-01-24 (×2): 25 ug via INTRAVENOUS
  Administered 2015-01-24: 50 ug via INTRAVENOUS

## 2015-01-24 MED ORDER — SODIUM CHLORIDE 0.9 % IJ SOLN
3.0000 mL | INTRAMUSCULAR | Status: DC | PRN
  Filled 2015-01-24: qty 3

## 2015-01-24 MED ORDER — SODIUM CHLORIDE 0.9 % IV SOLN
250.0000 mL | INTRAVENOUS | Status: DC | PRN
  Filled 2015-01-24: qty 250

## 2015-01-24 MED ORDER — ONDANSETRON HCL 4 MG/2ML IJ SOLN
INTRAMUSCULAR | Status: DC | PRN
  Administered 2015-01-24: 4 mg via INTRAVENOUS

## 2015-01-24 MED ORDER — SODIUM CHLORIDE 0.9 % IJ SOLN
3.0000 mL | Freq: Two times a day (BID) | INTRAMUSCULAR | Status: DC
  Filled 2015-01-24: qty 3

## 2015-01-24 MED ORDER — MIDAZOLAM HCL 5 MG/5ML IJ SOLN
INTRAMUSCULAR | Status: DC | PRN
  Administered 2015-01-24: 1 mg via INTRAVENOUS
  Administered 2015-01-24: 2 mg via INTRAVENOUS

## 2015-01-24 SURGICAL SUPPLY — 54 items
BENZOIN TINCTURE PRP APPL 2/3 (GAUZE/BANDAGES/DRESSINGS) ×3 IMPLANT
BLADE HEX COATED 2.75 (ELECTRODE) ×3 IMPLANT
BLADE SURG 10 STRL SS (BLADE) IMPLANT
BLADE SURG 15 STRL LF DISP TIS (BLADE) ×2 IMPLANT
BLADE SURG 15 STRL SS (BLADE) ×1
BRIEF STRETCH FOR OB PAD LRG (UNDERPADS AND DIAPERS) ×6 IMPLANT
CANISTER SUCTION 2500CC (MISCELLANEOUS) ×3 IMPLANT
CLOTH BEACON ORANGE TIMEOUT ST (SAFETY) IMPLANT
COVER MAYO STAND STRL (DRAPES) ×3 IMPLANT
COVER TABLE BACK 60X90 (DRAPES) ×3 IMPLANT
DECANTER SPIKE VIAL GLASS SM (MISCELLANEOUS) ×3 IMPLANT
DRAPE LG THREE QUARTER DISP (DRAPES) ×3 IMPLANT
DRAPE PED LAPAROTOMY (DRAPES) ×3 IMPLANT
DRAPE UNDERBUTTOCKS STRL (DRAPE) IMPLANT
DRAPE UTILITY XL STRL (DRAPES) ×3 IMPLANT
DRSG PAD ABDOMINAL 8X10 ST (GAUZE/BANDAGES/DRESSINGS) ×3 IMPLANT
ELECT BLADE 6.5 .24CM SHAFT (ELECTRODE) IMPLANT
ELECT REM PT RETURN 9FT ADLT (ELECTROSURGICAL) ×3
ELECTRODE REM PT RTRN 9FT ADLT (ELECTROSURGICAL) ×2 IMPLANT
GAUZE SPONGE 4X4 16PLY XRAY LF (GAUZE/BANDAGES/DRESSINGS) IMPLANT
GAUZE VASELINE 3X9 (GAUZE/BANDAGES/DRESSINGS) IMPLANT
GLOVE BIO SURGEON STRL SZ 6.5 (GLOVE) ×9 IMPLANT
GLOVE INDICATOR 6.5 STRL GRN (GLOVE) ×3 IMPLANT
GLOVE INDICATOR 7.0 STRL GRN (GLOVE) ×6 IMPLANT
GOWN PREVENTION PLUS LG XLONG (DISPOSABLE) ×3 IMPLANT
GOWN PREVENTION PLUS XLARGE (GOWN DISPOSABLE) ×3 IMPLANT
GOWN STRL REIN XL XLG (GOWN DISPOSABLE) ×3 IMPLANT
GOWN STRL REUS W/TWL 2XL LVL3 (GOWN DISPOSABLE) ×3 IMPLANT
HYDROGEN PEROXIDE 16OZ (MISCELLANEOUS) IMPLANT
LEGGING LITHOTOMY PAIR STRL (DRAPES) IMPLANT
LOOP VESSEL MAXI BLUE (MISCELLANEOUS) IMPLANT
NDL SAFETY ECLIPSE 18X1.5 (NEEDLE) IMPLANT
NEEDLE HYPO 18GX1.5 SHARP (NEEDLE)
NEEDLE HYPO 25X1 1.5 SAFETY (NEEDLE) ×3 IMPLANT
NS IRRIG 500ML POUR BTL (IV SOLUTION) ×3 IMPLANT
PACK BASIN DAY SURGERY FS (CUSTOM PROCEDURE TRAY) ×3 IMPLANT
PAD ABD 8X10 STRL (GAUZE/BANDAGES/DRESSINGS) IMPLANT
PENCIL BUTTON HOLSTER BLD 10FT (ELECTRODE) ×3 IMPLANT
SPONGE GAUZE 4X4 12PLY (GAUZE/BANDAGES/DRESSINGS) IMPLANT
SPONGE GAUZE 4X4 12PLY STER LF (GAUZE/BANDAGES/DRESSINGS) IMPLANT
SPONGE SURGIFOAM ABS GEL 12-7 (HEMOSTASIS) IMPLANT
SUT CHROMIC 2 0 SH (SUTURE) IMPLANT
SUT CHROMIC 3 0 SH 27 (SUTURE) IMPLANT
SUT ETHIBOND 0 (SUTURE) ×3 IMPLANT
SUT GUT CHROMIC 3 0 (SUTURE) IMPLANT
SUT MON AB 3-0 SH 27 (SUTURE)
SUT MON AB 3-0 SH27 (SUTURE) IMPLANT
SUT VIC AB 4-0 P-3 18XBRD (SUTURE) IMPLANT
SUT VIC AB 4-0 P3 18 (SUTURE)
SYR CONTROL 10ML LL (SYRINGE) ×3 IMPLANT
TOWEL OR 17X24 6PK STRL BLUE (TOWEL DISPOSABLE) ×3 IMPLANT
TRAY DSU PREP LF (CUSTOM PROCEDURE TRAY) ×3 IMPLANT
TUBE CONNECTING 12X1/4 (SUCTIONS) ×3 IMPLANT
YANKAUER SUCT BULB TIP NO VENT (SUCTIONS) ×3 IMPLANT

## 2015-01-24 NOTE — Anesthesia Preprocedure Evaluation (Addendum)
Anesthesia Evaluation  Patient identified by MRN, date of birth, ID band Patient awake    Reviewed: Allergy & Precautions, NPO status , Patient's Chart, lab work & pertinent test results  Airway Mallampati: II  TM Distance: >3 FB Neck ROM: Full    Dental no notable dental hx.    Pulmonary neg pulmonary ROS, former smoker,  breath sounds clear to auscultation  Pulmonary exam normal       Cardiovascular hypertension, Pt. on medications + dysrhythmias Atrial Fibrillation Rhythm:Regular Rate:Normal     Neuro/Psych negative neurological ROS  negative psych ROS   GI/Hepatic negative GI ROS, Neg liver ROS,   Endo/Other  negative endocrine ROS  Renal/GU negative Renal ROS  negative genitourinary   Musculoskeletal negative musculoskeletal ROS (+)   Abdominal   Peds negative pediatric ROS (+)  Hematology negative hematology ROS (+) anemia ,   Anesthesia Other Findings   Reproductive/Obstetrics negative OB ROS                            Anesthesia Physical Anesthesia Plan  ASA: III  Anesthesia Plan: MAC   Post-op Pain Management:    Induction:   Airway Management Planned: Simple Face Mask  Additional Equipment:   Intra-op Plan:   Post-operative Plan:   Informed Consent:   Plan Discussed with:   Anesthesia Plan Comments:         Anesthesia Quick Evaluation

## 2015-01-24 NOTE — Op Note (Signed)
01/24/2015  1:27 PM  PATIENT:  Hunter Edwards  59 y.o. male  Patient Care Team: Verdell Carmine, MD as PCP - General (Family Medicine)  PRE-OPERATIVE DIAGNOSIS:  Anorectal fistula  POST-OPERATIVE DIAGNOSIS:  Anorectal fistula  PROCEDURE: ANAL  EXAM UNDER ANESTHESIA FISTULOTOMY   Surgeon(s): [name], MD  ASSISTANT: none   ANESTHESIA:   local and IV sedation  SPECIMEN:  No Specimen  DISPOSITION OF SPECIMEN:  N/A  COUNTS:  YES  PLAN OF CARE: Discharge to home after PACU  PATIENT DISPOSITION:  PACU - hemodynamically stable.  INDICATION: 58 y.o. M with posterior anorectal fistula and Crohn's disease.     OR FINDINGS: shallow fistula and abscess involving skin only.  No significant distal rectal or anal inflammation.  Large grade 2 internal hemorrhoids.    DESCRIPTION: the patient was identified in the preoperative holding area and taken to the OR where they were laid on the operating room table.  MAC anesthesia was induced without difficulty. The patient was then positioned in prone jackknife position with buttocks gently taped apart.  The patient was then prepped and draped in usual sterile fashion.  SCDs were noted to be in place prior to the initiation of anesthesia. A surgical timeout was performed indicating the correct patient, procedure, positioning and need for preoperative antibiotics.  A rectal block was performed using Marcaine with epinephrine.   I began with a digital rectal exam.  The patient has enlarged hemorrhoids internally.  I then placed a Hill-Ferguson anoscope into the anal canal and evaluated this completely.  Internal hemorrhoids were enlarged in all 3 positions.  I placed a fistula probe into the external opening.  This was a fistula involving only skin from what appears like a healing fissure.  I performed a fistulotomy.  Hemostasis was good.  The patient was sent to the PACU in stable condition.  All counts were correct per OR staff.

## 2015-01-24 NOTE — H&P (View-Only) (Signed)
Hunter Edwards 01/22/2015 11:14 AM Location: [facility] Patient #: [id number] DOB: July 22, 1956 Married / Language: Undefined / Race: Undefined Male History of Present Illness [name] MD; 11/16/8784 12:02 PM) Patient words: New Patient.  The patient is a 59 year old male who presents with anal itching. Patient presents to the office with an anorectal fistula, which she has known about for quite some time. He was diagnosed with Crohn's disease approximately 2 years ago. Recently he had his first flare of the disease with significant diarrhea. This is causing his fistula to become more active. He is currently on prednisone Flagyl and Augmentin for his Crohn's disease. He also was recently diagnosed with paroxysmal atrial fibrillation and is on flecainide and metoprolol for this. He complains of significant anal rectal pain with bowel movements as well as continued drainage from his fistula. He also complains of pain from his hemorrhoid disease and occasional prolapse of his hemorrhoids. Per pt, his last colonoscopy by Dr. Hilarie Fredrickson showed patchy colonic Crohn's. Per pt, recent CT scan was negative for any major small bowel inflammation. Other Problems Sharman Crate, LPN; 05/20/7208 47:09 AM) Atrial Fibrillation Back Pain Chest pain Crohn's Disease Gastroesophageal Reflux Disease Hemorrhoids High blood pressure Hypercholesterolemia Other disease, cancer, significant illness  Past Surgical History Sharman Crate, LPN; 04/16/8365 29:47 AM) Tonsillectomy  Diagnostic Studies History Sharman Crate, LPN; 04/19/4649 35:46 AM) Colonoscopy 1-5 years ago  Allergies Sharman Crate, LPN; 03/20/8126 51:70 AM) Ignacia Bayley Antibiotics  Medication History Sharman Crate, LPN; 0/11/7492 49:67 AM) Aspirin Low Strength (81MG Tablet Chewable, Oral daily) Active. Amoxicillin-Pot Clavulanate (875-125MG Tablet, Oral) Active. Flagyl (500MG Tablet, Oral) Active. Tambocor (100MG Tablet,  Oral daily) Active. Toprol XL (25MG Tablet ER 24HR, Oral daily) Active. PredniSONE (10MG Tablet, Oral) Active. Probiotic (Oral daily) Active.  Social History Sharman Crate, LPN; 03/23/1637 46:65 AM) Caffeine use Coffee, Tea. No alcohol use No drug use Tobacco use Former smoker.  Family History Sharman Crate, LPN; 07/24/3569 17:79 AM) Cancer Father. Diabetes Mellitus Brother. Heart Disease Brother. Hypertension Brother, Mother.     Review of Systems Clarise Cruz [facility] LPN; 01/21/299 92:33 AM) General Present- Fatigue and Weight Loss. Not Present- Appetite Loss, Chills, Fever, Night Sweats and Weight Gain. Skin Present- Dryness. Not Present- Change in Wart/Mole, Hives, Jaundice, New Lesions, Non-Healing Wounds, Rash and Ulcer. HEENT Present- Hoarseness, Visual Disturbances and Wears glasses/contact lenses. Not Present- Earache, Hearing Loss, Nose Bleed, Oral Ulcers, Ringing in the Ears, Seasonal Allergies, Sinus Pain, Sore Throat and Yellow Eyes. Respiratory Present- Snoring. Not Present- Bloody sputum, Chronic Cough, Difficulty Breathing and Wheezing. Breast Not Present- Breast Mass, Breast Pain, Nipple Discharge and Skin Changes. Cardiovascular Not Present- Chest Pain, Difficulty Breathing Lying Down, Leg Cramps, Palpitations, Rapid Heart Rate, Shortness of Breath and Swelling of Extremities. Gastrointestinal Present- Abdominal Pain, Bloody Stool, Change in Bowel Habits, Chronic diarrhea, Excessive gas, Hemorrhoids, Nausea and Rectal Pain. Not Present- Bloating, Constipation, Difficulty Swallowing, Gets full quickly at meals, Indigestion and Vomiting. Male Genitourinary Not Present- Blood in Urine, Change in Urinary Stream, Frequency, Impotence, Nocturia, Painful Urination, Urgency and Urine Leakage.  Vitals Clarise Cruz [facility] LPN; 0/0/7622 63:33 AM) 01/22/2015 11:14 AM Weight: 197.13 lb Height: 66in Body Surface Area: 2.04 m Body Mass Index: 31.82 kg/m Temp.: 98.6F(Oral)   Pulse: 69 (Regular)  BP: 118/60 (Sitting, Left Arm, Standard)     Physical Exam [name] MD; 03/18/5624 11:57 AM)  General Mental Status-Alert. General Appearance-Consistent with stated age. Hydration-Well hydrated. Voice-Normal.  Head and Neck Head-normocephalic, atraumatic with no lesions or palpable  masses. Trachea-midline. Thyroid Gland Characteristics - normal size and consistency.  Eye Eyeball - Bilateral-Extraocular movements intact. Sclera/Conjunctiva - Bilateral-No scleral icterus.  Chest and Lung Exam Chest and lung exam reveals -quiet, even and easy respiratory effort with no use of accessory muscles and on auscultation, normal breath sounds, no adventitious sounds and normal vocal resonance. Inspection Chest Wall - Normal. Back - normal.  Cardiovascular Cardiovascular examination reveals -normal heart sounds, regular rate and rhythm with no murmurs and normal pedal pulses bilaterally.  Abdomen Inspection Inspection of the abdomen reveals - No Hernias. Palpation/Percussion Palpation and Percussion of the abdomen reveal - Soft, Non Tender, No Rebound tenderness, No Rigidity (guarding) and No hepatosplenomegaly. Auscultation Auscultation of the abdomen reveals - Bowel sounds normal.  Rectal Anorectal Exam External - Note: Purulence draining from posterior midline externally. Consistent with anorectal fistula.  Neurologic Neurologic evaluation reveals -alert and oriented x 3 with no impairment of recent or remote memory. Mental Status-Normal.  Musculoskeletal Global Assessment -Note:no gross deformities.  Normal Exam - Left-Upper Extremity Strength Normal and Lower Extremity Strength Normal. Normal Exam - Right-Upper Extremity Strength Normal and Lower Extremity Strength Normal.    Assessment & Plan [name] MD; 05/23/4127 11:56 AM)  PERIANAL CROHN'S DISEASE, WITH FISTULA (555.1  K50.113) Impression:  Patient with a long-standing history of anorectal fistula. He also has a diagnosis of colonic Crohn's disease which has been present for approximately 2 years. He has just encountered his first flare of the disease. He is currently on antibiotics and prednisone. He is scheduled to start Remicade tomorrow. On exam he has a fistula with quite a bit of scar tissue posteriorly. I have recommended seton placement during the Remicade. We will then discuss further treatment once his flare has calmed down.

## 2015-01-24 NOTE — Transfer of Care (Signed)
Immediate Anesthesia Transfer of Care Note  Patient: Hunter Edwards  Procedure(s) Performed: Procedure(s) (LRB): ANAL  EXAM UNDER ANESTHESIA (N/A) FISTULECTOMY ANAL (N/A)  Patient Location: PACU  Anesthesia Type: MAC  Level of Consciousness: awake, sedated, patient cooperative and responds to stimulation  Airway & Oxygen Therapy: Patient Spontanous Breathing and Patient connected to face mask oxygen  Post-op Assessment: Report given to PACU RN, Post -op Vital signs reviewed and stable and Patient moving all extremities  Post vital signs: Reviewed and stable  Complications: No apparent anesthesia complications

## 2015-01-24 NOTE — Interval H&P Note (Signed)
History and Physical Interval Note:  01/24/2015 12:53 PM  Hunter Edwards  has presented today for surgery, with the diagnosis of crohn's fistula  The various methods of treatment have been discussed with the patient and family. After consideration of risks, benefits and other options for treatment, the patient has consented to  Procedure(s): ANAL  EXAM UNDER ANESTHESIA (N/A) PLACEMENT OF SETON (N/A) as a surgical intervention .  The patient's history has been reviewed, patient examined, no change in status, stable for surgery.  I have reviewed the patient's chart and labs.  Questions were answered to the patient's satisfaction.     Rosario Adie, MD  [facility]

## 2015-01-24 NOTE — Anesthesia Postprocedure Evaluation (Signed)
  Anesthesia Post-op Note  Patient: Hunter Edwards  Procedure(s) Performed: Procedure(s) (LRB): ANAL  EXAM UNDER ANESTHESIA (N/A) FISTULECTOMY ANAL (N/A)  Patient Location: PACU  Anesthesia Type: MAC  Level of Consciousness: awake and alert   Airway and Oxygen Therapy: Patient Spontanous Breathing  Post-op Pain: mild  Post-op Assessment: Post-op Vital signs reviewed, Patient's Cardiovascular Status Stable, Respiratory Function Stable, Patent Airway and No signs of Nausea or vomiting  Last Vitals:  Filed Vitals:   01/24/15 1342  BP: 98/55  Pulse: 63  Temp: 36.7 C  Resp: 14    Post-op Vital Signs: stable   Complications: No apparent anesthesia complications

## 2015-01-24 NOTE — Discharge Instructions (Addendum)
ANORECTAL SURGERY: POST OP INSTRUCTIONS °1. Take your usually prescribed home medications unless otherwise directed. °2. DIET: During the first few hours after surgery sip on some liquids until you are able to urinate.  It is normal to not urinate for several hours after this surgery.  If you feel uncomfortable, please contact the office for instructions.  After you are able to urinate,you may eat, if you feel like it.  Follow a light bland diet the first 24 hours after arrival home, such as soup, liquids, crackers, etc.  Be sure to include lots of fluids daily (6-8 glasses).  Avoid fast food or heavy meals, as your are more likely to get nauseated.  Eat a low fat diet the next few days after surgery.  Limit caffeine intake to 1-2 servings a day. °3. PAIN CONTROL: °a. Pain is best controlled by a usual combination of several different methods TOGETHER: °i. Muscle relaxation: Soak in a warm bath (or Sitz bath) three times a day and after bowel movements.  Continue to do this until all pain is resolved. °ii. Over the counter pain medication °iii. Prescription pain medication °b. Most patients will experience some swelling and discomfort in the anus/rectal area and incisions.  Heat such as warm towels, sitz baths, warm baths, etc to help relax tight/sore spots and speed recovery.  Some people prefer to use ice, especially in the first couple days after surgery, as it may decrease the pain and swelling, or alternate between ice & heat.  Experiment to what works for you.  Swelling and bruising can take several weeks to resolve.  Pain can take even longer to completely resolve. °c. It is helpful to take an over-the-counter pain medication regularly for the first few weeks.  Choose one of the following that works best for you: °i. Naproxen (Aleve, etc)  Two 220mg tabs twice a day °ii. Ibuprofen (Advil, etc) Three 200mg tabs four times a day (every meal & bedtime) °d. A  prescription for pain medication (such as percocet,  oxycodone, hydrocodone, etc) should be given to you upon discharge.  Take your pain medication as prescribed.  °i. If you are having problems/concerns with the prescription medicine (does not control pain, nausea, vomiting, rash, itching, etc), please call us [phone] to see if we need to switch you to a different pain medicine that will work better for you and/or control your side effect better. °ii. If you need a refill on your pain medication, please contact your pharmacy.  They will contact our office to request authorization. Prescriptions will not be filled after 5 pm or on week-ends. °4. KEEP YOUR BOWELS REGULAR and AVOID CONSTIPATION °a. The goal is one to two soft bowel movements a day.  You should at least have a bowel movement every other day. °b. Avoid getting constipated.  Between the surgery and the pain medications, it is common to experience some constipation. This can be very painful after rectal surgery.  Increasing fluid intake and taking a fiber supplement (such as Metamucil, Citrucel, FiberCon, etc) 1-2 times a day regularly will usually help prevent this problem from occurring.  A stool softener like colace is also recommended.  This can be purchased over the counter at your pharmacy.  You can take it up to 3 times a day.  If you do not have a bowel movement after 24 hrs since your surgery, take one does of milk of magnesia.  If you still haven't had a bowel movement 8-12 hours after   that dose, take another dose.  If you don't have a bowel movement 48 hrs after surgery, purchase a Fleets enema from the drug store and administer gently per package instructions.  If you still are having trouble with your bowel movements after that, please call the office for further instructions. °c. If you develop diarrhea or have many loose bowel movements, simplify your diet to bland foods & liquids for a few days.  Stop any stool softeners and decrease your fiber supplement.  Switching to mild  anti-diarrheal medications (Kayopectate, Pepto Bismol) can help.  If this worsens or does not improve, please call us. ° °5. Wound Care °a. Remove your bandages before your first bowel movement or 8 hours after surgery.     °b. Remove any wound packing material at this tim,e as well.  You do not need to repack the wound unless instructed otherwise.  Wear an absorbent pad or soft cotton gauze in your underwear to catch any drainage and help keep the area clean. You should change this every 2-3 hours while awake. °c. Keep the area clean and dry.  Bathe / shower every day, especially after bowel movements.  Keep the area clean by showering / bathing over the incision / wound.   It is okay to soak an open wound to help wash it.  Wet wipes or showers / gentle washing after bowel movements is often less traumatic than regular toilet paper. °d. You may have some styrofoam-like soft packing in the rectum which will come out with the first bowel movement.  °e. You will often notice bleeding with bowel movements.  This should slow down by the end of the first week of surgery °f. Expect some drainage.  This should slow down, too, by the end of the first week of surgery.  Wear an absorbent pad or soft cotton gauze in your underwear until the drainage stops. °g. Do Not sit on a rubber or pillow ring.  This can make you symptoms worse.  You may sit on a soft pillow if needed.  °6. ACTIVITIES as tolerated:   °a. You may resume regular (light) daily activities beginning the next day--such as daily self-care, walking, climbing stairs--gradually increasing activities as tolerated.  If you can walk 30 minutes without difficulty, it is safe to try more intense activity such as jogging, treadmill, bicycling, low-impact aerobics, swimming, etc. °b. Save the most intensive and strenuous activity for last such as sit-ups, heavy lifting, contact sports, etc  Refrain from any heavy lifting or straining until you are off narcotics for pain  control.   °c. You may drive when you are no longer taking prescription pain medication, you can comfortably sit for long periods of time, and you can safely maneuver your car and apply brakes. °d. You may have sexual intercourse when it is comfortable.  °7. FOLLOW UP in our office °a. Please call [facility] at [phone] to set up an appointment to see your surgeon in the office for a follow-up appointment approximately 3-4 weeks after your surgery. °b. Make sure that you call for this appointment the day you arrive home to insure a convenient appointment time. °10. IF YOU HAVE DISABILITY OR FAMILY LEAVE FORMS, BRING THEM TO THE OFFICE FOR PROCESSING.  DO NOT GIVE THEM TO YOUR DOCTOR. ° ° ° ° °WHEN TO CALL US [phone]: °1. Poor pain control °2. Reactions / problems with new medications (rash/itching, nausea, etc)  °3. Fever over 101.5 F (38.5 C) °4.   Inability to urinate °5. Nausea and/or vomiting °6. Worsening swelling or bruising °7. Continued bleeding from incision. °8. Increased pain, redness, or drainage from the incision ° °The clinic staff is available to answer your questions during regular business hours (8:30am-5pm).  Please don’t hesitate to call and ask to speak to one of our nurses for clinical concerns.   A surgeon from [facility] is always on call at the hospitals °  °If you have a medical emergency, go to the nearest emergency room or call 911. °  ° °[facility] °[address], [address], [place],   [address] ? °MAIN: [phone] ? TOLL FREE: [phone] ? °FAX [phone] °[email or web address] ° ° ° ° ° °Post Anesthesia Home Care Instructions ° °Activity: °Get plenty of rest for the remainder of the day. A responsible adult should stay with you for 24 hours following the procedure.  °For the next 24 hours, DO NOT: °-Drive a car °-Operate machinery °-Drink alcoholic beverages °-Take any medication unless instructed by your  physician °-Make any legal decisions or sign important papers. ° °Meals: °Start with liquid foods such as gelatin or soup. Progress to regular foods as tolerated. Avoid greasy, spicy, heavy foods. If nausea and/or vomiting occur, drink only clear liquids until the nausea and/or vomiting subsides. Call your physician if vomiting continues. ° °Special Instructions/Symptoms: °Your throat may feel dry or sore from the anesthesia or the breathing tube placed in your throat during surgery. If this causes discomfort, gargle with warm salt water. The discomfort should disappear within 24 hours. ° °

## 2015-01-27 ENCOUNTER — Encounter (HOSPITAL_BASED_OUTPATIENT_CLINIC_OR_DEPARTMENT_OTHER): Payer: Self-pay | Admitting: General Surgery

## 2015-01-27 ENCOUNTER — Telehealth: Payer: Self-pay | Admitting: Physician Assistant

## 2015-01-27 NOTE — Telephone Encounter (Signed)
Pt is doing good from surgery and diarrhea has stopped. States he went into A fib this weekend and is seeing cardiologist. Pt calling asking if FMLA papers are ready. Let pt know I will check on papers and let him know.

## 2015-01-28 ENCOUNTER — Other Ambulatory Visit (INDEPENDENT_AMBULATORY_CARE_PROVIDER_SITE_OTHER): Payer: BLUE CROSS/BLUE SHIELD

## 2015-01-28 DIAGNOSIS — K50119 Crohn's disease of large intestine with unspecified complications: Secondary | ICD-10-CM

## 2015-01-28 LAB — CBC WITH DIFFERENTIAL/PLATELET
Basophils Absolute: 0 10*3/uL (ref 0.0–0.1)
Basophils Relative: 0.1 % (ref 0.0–3.0)
Eosinophils Absolute: 0 10*3/uL (ref 0.0–0.7)
Eosinophils Relative: 0 % (ref 0.0–5.0)
HCT: 30.4 % — ABNORMAL LOW (ref 39.0–52.0)
Hemoglobin: 10.3 g/dL — ABNORMAL LOW (ref 13.0–17.0)
Lymphocytes Relative: 6.6 % — ABNORMAL LOW (ref 12.0–46.0)
Lymphs Abs: 0.9 10*3/uL (ref 0.7–4.0)
MCHC: 33.7 g/dL (ref 30.0–36.0)
MCV: 90.5 fl (ref 78.0–100.0)
Monocytes Absolute: 0.7 10*3/uL (ref 0.1–1.0)
Monocytes Relative: 5 % (ref 3.0–12.0)
Neutro Abs: 12.4 10*3/uL — ABNORMAL HIGH (ref 1.4–7.7)
Neutrophils Relative %: 88.3 % — ABNORMAL HIGH (ref 43.0–77.0)
Platelets: 542 10*3/uL — ABNORMAL HIGH (ref 150.0–400.0)
RBC: 3.36 Mil/uL — ABNORMAL LOW (ref 4.22–5.81)
RDW: 16.7 % — ABNORMAL HIGH (ref 11.5–15.5)
WBC: 14 10*3/uL — ABNORMAL HIGH (ref 4.0–10.5)

## 2015-01-28 NOTE — Telephone Encounter (Signed)
Called [employer] and they are working on papers for pt. [employer] calling pt to let him know he needed to bring some additional information and a check.

## 2015-01-31 ENCOUNTER — Telehealth: Payer: Self-pay

## 2015-01-31 NOTE — Telephone Encounter (Signed)
Pt scheduled to see Dr. Hilarie Fredrickson 02/27/15@2 :30pm. Appt letter mailed to pt.

## 2015-01-31 NOTE — Telephone Encounter (Signed)
-----   Message from Jerene Bears, MD sent at 01/30/2015  4:09 PM EDT ----- Regarding: RE: OV Can use banding slot ----- Message -----    From: Algernon Huxley, RN    Sent: 01/28/2015  10:55 AM      To: Jerene Bears, MD Subject: OV                                             Dr. Hilarie Fredrickson,  Please see the note from [facility] Hvozdovic, PA-C Below. Are you ok with me using one of your banding slots for an appt for Mr. Freeland or do you want it pushed out further?  Dag ----- Message -----    From: Vita Barley Hvozdovic, PA-C    Sent: 01/28/2015  10:44 AM      To: Algernon Huxley, RN  Vaughan Basta, can you schedule Hunter Edwards an appt with Dr Hilarie Fredrickson in 4 weeks? Thank you

## 2015-02-05 ENCOUNTER — Other Ambulatory Visit (HOSPITAL_COMMUNITY): Payer: Self-pay | Admitting: *Deleted

## 2015-02-06 ENCOUNTER — Encounter (HOSPITAL_COMMUNITY)
Admission: RE | Admit: 2015-02-06 | Discharge: 2015-02-06 | Disposition: A | Discharge status: Home / Self Care | Payer: BLUE CROSS/BLUE SHIELD | Source: Ambulatory Visit | Attending: Internal Medicine | Admitting: Internal Medicine

## 2015-02-06 ENCOUNTER — Other Ambulatory Visit: Payer: Self-pay

## 2015-02-06 VITALS — BP 101/61 | HR 61 | Temp 97.9°F | Resp 20 | Ht 66.5 in | Wt 182.4 lb

## 2015-02-06 DIAGNOSIS — K50913 Crohn's disease, unspecified, with fistula: Secondary | ICD-10-CM

## 2015-02-06 DIAGNOSIS — K50113 Crohn's disease of large intestine with fistula: Secondary | ICD-10-CM | POA: Diagnosis not present

## 2015-02-06 MED ORDER — DIPHENHYDRAMINE HCL 25 MG PO TABS
25.0000 mg | ORAL_TABLET | Freq: Once | ORAL | Status: DC
  Filled 2015-02-06: qty 1

## 2015-02-06 MED ORDER — DIPHENHYDRAMINE HCL 25 MG PO CAPS
ORAL_CAPSULE | ORAL | Status: AC
  Administered 2015-02-06: 25 mg
  Filled 2015-02-06: qty 1

## 2015-02-06 MED ORDER — ACETAMINOPHEN 325 MG PO TABS
ORAL_TABLET | ORAL | Status: AC
  Administered 2015-02-06: 650 mg
  Filled 2015-02-06: qty 2

## 2015-02-06 MED ORDER — INFLIXIMAB 100 MG IV SOLR
5.0000 mg/kg | Freq: Once | INTRAVENOUS | Status: DC
  Filled 2015-02-06: qty 40

## 2015-02-06 MED ORDER — SODIUM CHLORIDE 0.9 % IV SOLN
Freq: Once | INTRAVENOUS | Status: AC
  Administered 2015-02-06: 12:00:00 via INTRAVENOUS

## 2015-02-06 MED ORDER — SODIUM CHLORIDE 0.9 % IV SOLN
5.0000 mg/kg | Freq: Once | INTRAVENOUS | Status: AC
  Administered 2015-02-06: 400 mg via INTRAVENOUS
  Filled 2015-02-06: qty 40

## 2015-02-06 MED ORDER — ACETAMINOPHEN 325 MG PO TABS: 650.0000 mg | ORAL_TABLET | Freq: Once | ORAL | Status: DC

## 2015-02-12 ENCOUNTER — Other Ambulatory Visit: Payer: Self-pay

## 2015-02-12 ENCOUNTER — Encounter: Payer: Self-pay | Admitting: *Deleted

## 2015-02-12 DIAGNOSIS — K5090 Crohn's disease, unspecified, without complications: Secondary | ICD-10-CM

## 2015-02-13 ENCOUNTER — Other Ambulatory Visit: Payer: Self-pay

## 2015-02-13 DIAGNOSIS — K5090 Crohn's disease, unspecified, without complications: Secondary | ICD-10-CM

## 2015-02-24 ENCOUNTER — Ambulatory Visit (INDEPENDENT_AMBULATORY_CARE_PROVIDER_SITE_OTHER): Payer: BLUE CROSS/BLUE SHIELD | Admitting: Internal Medicine

## 2015-02-24 DIAGNOSIS — Z23 Encounter for immunization: Secondary | ICD-10-CM

## 2015-02-27 ENCOUNTER — Encounter: Payer: Self-pay | Admitting: Internal Medicine

## 2015-02-27 ENCOUNTER — Other Ambulatory Visit (INDEPENDENT_AMBULATORY_CARE_PROVIDER_SITE_OTHER): Payer: BLUE CROSS/BLUE SHIELD

## 2015-02-27 ENCOUNTER — Ambulatory Visit (INDEPENDENT_AMBULATORY_CARE_PROVIDER_SITE_OTHER): Payer: BLUE CROSS/BLUE SHIELD | Admitting: Internal Medicine

## 2015-02-27 VITALS — BP 116/60 | HR 68 | Ht 66.6 in | Wt 196.5 lb

## 2015-02-27 DIAGNOSIS — K50119 Crohn's disease of large intestine with unspecified complications: Secondary | ICD-10-CM | POA: Diagnosis not present

## 2015-02-27 DIAGNOSIS — K50113 Crohn's disease of large intestine with fistula: Secondary | ICD-10-CM

## 2015-02-27 DIAGNOSIS — K50111 Crohn's disease of large intestine with rectal bleeding: Secondary | ICD-10-CM | POA: Diagnosis not present

## 2015-02-27 DIAGNOSIS — D509 Iron deficiency anemia, unspecified: Secondary | ICD-10-CM

## 2015-02-27 DIAGNOSIS — Z79899 Other long term (current) drug therapy: Secondary | ICD-10-CM | POA: Diagnosis not present

## 2015-02-27 DIAGNOSIS — I4891 Unspecified atrial fibrillation: Secondary | ICD-10-CM

## 2015-02-27 LAB — CBC WITH DIFFERENTIAL/PLATELET
Basophils Absolute: 0 10*3/uL (ref 0.0–0.1)
Basophils Relative: 0.3 % (ref 0.0–3.0)
Eosinophils Absolute: 0 10*3/uL (ref 0.0–0.7)
Eosinophils Relative: 0.2 % (ref 0.0–5.0)
HCT: 37.5 % — ABNORMAL LOW (ref 39.0–52.0)
Hemoglobin: 12.5 g/dL — ABNORMAL LOW (ref 13.0–17.0)
Lymphocytes Relative: 11.4 % — ABNORMAL LOW (ref 12.0–46.0)
Lymphs Abs: 1.5 10*3/uL (ref 0.7–4.0)
MCHC: 33.3 g/dL (ref 30.0–36.0)
MCV: 88.1 fl (ref 78.0–100.0)
Monocytes Absolute: 0.7 10*3/uL (ref 0.1–1.0)
Monocytes Relative: 5.6 % (ref 3.0–12.0)
Neutro Abs: 10.5 10*3/uL — ABNORMAL HIGH (ref 1.4–7.7)
Neutrophils Relative %: 82.5 % — ABNORMAL HIGH (ref 43.0–77.0)
Platelets: 281 10*3/uL (ref 150.0–400.0)
RBC: 4.25 Mil/uL (ref 4.22–5.81)
RDW: 14.8 % (ref 11.5–15.5)
WBC: 12.7 10*3/uL — ABNORMAL HIGH (ref 4.0–10.5)

## 2015-02-27 LAB — FERRITIN: Ferritin: 10.1 ng/mL — ABNORMAL LOW (ref 22.0–322.0)

## 2015-02-27 LAB — IBC PANEL
Iron: 42 ug/dL (ref 42–165)
Saturation Ratios: 12.6 % — ABNORMAL LOW (ref 20.0–50.0)
Transferrin: 238 mg/dL (ref 212.0–360.0)

## 2015-02-27 LAB — HIGH SENSITIVITY CRP: CRP, High Sensitivity: 0.48 mg/L (ref 0.000–5.000)

## 2015-02-27 NOTE — Patient Instructions (Signed)
Please decrease prednisone by 5 mg every 5 days until completely finished.  Continue Remicade (03/20/15). You will have this every 8 weeks.  Your physician has requested that you go to the basement for the following lab work before leaving today: CBC, IBC, CRP  Return in 4 months to see Dr Hilarie Fredrickson.

## 2015-02-27 NOTE — Progress Notes (Signed)
Subjective:    Patient ID: Hunter Edwards, male    DOB: 1956/04/18, 59 y.o.   MRN: [id number]  HPI Jahsiah Carpenter is a 59 year old male with a past medical history of perianal and colonic Crohn's disease diagnosed in 2694, complicated by perirectal abscess and fistula formation status post fistulotomy recently who is seen for follow-up. He was initially seen in our office by Cecille Rubin Hvozdovic on 01/15/2015 and was then seen by Dr. [name] who performed fistulotomy thereafter. He was treated initially with antibiotics and has been using probiotic. He was also started on Remicade and has had his first and second induction infusions. He tolerated this medication extremely well. He is feeling very very good. He is finishing his prednisone taper which has been over an 8 week time period. He is on 15 mg daily now with plans to wean by 5 mg every week. Bowel movements have been formed stool and easy to pass without pain. Very minor or scant rectal bleeding with wiping only. No blood in the stool or melena. No fevers or chills. He has had rash on and off for many years in bilateral arms and legs which also has resolved with treatment. He has been cleared to go back to work and plans to work again Sunday night. 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 He does continue Florastor 250 milligrams twice daily  Prior colonoscopy December 2014 by Dr.Shearen revealed focal colitis approximately 50 cm from the anal verge in the descending colon. There was also very distal proctitis with fistulous tracts near the anal canal. He was seen by Dr. Morton Stall of colorectal surgery at [facility] where pelvic MRI was done. He was treated with antibiotics but surgery was deferred. He was treated with Lialda for some time  In February 2016 again having rectal pain, diarrhea and bleeding. CT scan of the abdomen is performed which showed bowel wall thickening involving the left colon greatest in the descending colon with  pericolonic stranding but no frank perforation or abscess was seen. Thus he establish care with [place] thereafter. He had lost 15 pounds though now his weight has returned to more normal for him  Finally he was having palpitations within the last 6 weeks and was seen by cardiology and found to have A. fib. He has been on flecainide and now is in normal sinus rhythm anticoagulation was deferred due to rectal bleeding  Review of Systems As per history of present illness, otherwise negative  Current Medications, Allergies, Past Medical History, Past Surgical History, Family History and Social History were reviewed in [employer] record.      Objective:   Physical Exam BP 116/60 mmHg  Pulse 68  Ht 5' 6.6" (1.692 m)  Wt 196 lb 8 oz (89.132 kg)  BMI 31.13 kg/m2 Constitutional: Well-developed and well-nourished. No distress. HEENT: Normocephalic and atraumatic. Oropharynx is clear and moist. No oropharyngeal exudate. Conjunctivae are normal.  No scleral icterus. Neck: Neck supple. Trachea midline. Cardiovascular: Normal rate, regular rhythm and intact distal pulses. No M/R/G Pulmonary/chest: Effort normal and breath sounds normal. No wheezing, rales or rhonchi. Abdominal: Soft, nontender, nondistended. Bowel sounds active throughout. There are no masses palpable. No hepatosplenomegaly. Extremities: no clubbing, cyanosis, or edema Lymphadenopathy: No cervical adenopathy noted. Neurological: Alert and oriented to person place and time. Skin: Skin is warm and dry. No rashes noted. Psychiatric: Normal mood and affect. Behavior is normal.  CBC    Component Value Date/Time   WBC  12.7* 02/27/2015 1500   RBC 4.25 02/27/2015 1500   HGB 12.5* 02/27/2015 1500   HCT 37.5* 02/27/2015 1500   PLT 281.0 02/27/2015 1500   MCV 88.1 02/27/2015 1500   MCHC 33.3 02/27/2015 1500   RDW 14.8 02/27/2015 1500   LYMPHSABS 1.5 02/27/2015 1500   MONOABS 0.7 02/27/2015 1500   EOSABS 0.0  02/27/2015 1500   BASOSABS 0.0 02/27/2015 1500    CMP     Component Value Date/Time   NA 140 01/24/2015 1201   K 3.0* 01/24/2015 1201   CL 97 01/17/2015 1025   CO2 31 01/17/2015 1025   GLUCOSE 81 01/24/2015 1201   BUN 13 01/17/2015 1025   CREATININE 1.02 01/17/2015 1025   CALCIUM 8.1* 01/17/2015 1025   PROT 5.9* 01/17/2015 1025   ALBUMIN 2.7* 01/17/2015 1025   AST 8 01/17/2015 1025   ALT 10 01/17/2015 1025   ALKPHOS 53 01/17/2015 1025   BILITOT 0.5 01/17/2015 1025   hsCRP 0.4  Iron/TIBC/Ferritin/ %Sat    Component Value Date/Time   IRON 42 02/27/2015 1500   FERRITIN 10.1* 02/27/2015 1500   IRONPCTSAT 12.6* 02/27/2015 1500       Assessment & Plan:  59 year old male with a past medical history of perianal and colonic Crohn's disease diagnosed in 1610, complicated by perirectal abscess and fistula formation status post fistulotomy recently who is seen for follow-up.  1. Colonic and perianal Crohn's disease -- complicated by fistula formation in the distal rectum and also rectal bleeding. Now much improved having undergone prednisone taper and beginning Remicade with induction. He is due his third and final induction dose 6 weeks from initiation on 03/20/2015. He has tolerated this medication well. We again reviewed the risks, benefits and alternatives to this medication. He very much wants to continue because he is feeling wonderfully. I will slightly accelerate his prednisone taper and have him decrease by 5 mg every 5 days until off. This will be approximately 12 more days of steroids. We discussed the importance of avoiding steroids if at all possible. He understands this explanation. We also discussed that his biologic therapy will likely be long-standing. He can continue probiotic which she feels helps with borborygmi and bloating. May be able to get by with once per day --Annual flu vaccine and Pneumovax recommended  2. IDA -- mild though improving anemia, but iron studies  low. Needs oral iron replacement. Plan iron sulfate 325 mg 2 times daily. Repeat CBC and iron studies in 3 months  3. A. Fib -- now improved on antiarrhythmic therapy. Has cardiology follow-up  Return in 3-4 months, sooner if necessary Total encounter 40 minutes, greater than 50% of this time spent in discussion counseling of the above issues

## 2015-02-28 ENCOUNTER — Other Ambulatory Visit: Payer: Self-pay

## 2015-02-28 DIAGNOSIS — D509 Iron deficiency anemia, unspecified: Secondary | ICD-10-CM

## 2015-02-28 MED ORDER — FERROUS SULFATE 325 (65 FE) MG PO TABS: 325.0000 mg | ORAL_TABLET | Freq: Two times a day (BID) | ORAL | Status: DC

## 2015-03-20 ENCOUNTER — Encounter (HOSPITAL_COMMUNITY)
Admission: RE | Admit: 2015-03-20 | Discharge: 2015-03-20 | Disposition: A | Discharge status: Home / Self Care | Payer: BLUE CROSS/BLUE SHIELD | Source: Ambulatory Visit | Attending: Internal Medicine | Admitting: Internal Medicine

## 2015-03-20 ENCOUNTER — Other Ambulatory Visit: Payer: Self-pay

## 2015-03-20 VITALS — BP 116/59 | HR 56 | Temp 98.2°F | Resp 20 | Ht 66.5 in | Wt 193.0 lb

## 2015-03-20 DIAGNOSIS — K50113 Crohn's disease of large intestine with fistula: Secondary | ICD-10-CM | POA: Insufficient documentation

## 2015-03-20 DIAGNOSIS — K5090 Crohn's disease, unspecified, without complications: Secondary | ICD-10-CM

## 2015-03-20 MED ORDER — ACETAMINOPHEN 325 MG PO TABS
650.0000 mg | ORAL_TABLET | Freq: Once | ORAL | Status: AC
  Administered 2015-03-20: 650 mg via ORAL

## 2015-03-20 MED ORDER — DIPHENHYDRAMINE HCL 25 MG PO CAPS
ORAL_CAPSULE | ORAL | Status: AC
  Administered 2015-03-20: 50 mg via ORAL
  Filled 2015-03-20: qty 2

## 2015-03-20 MED ORDER — DIPHENHYDRAMINE HCL 25 MG PO CAPS
50.0000 mg | ORAL_CAPSULE | Freq: Once | ORAL | Status: AC
  Administered 2015-03-20: 50 mg via ORAL

## 2015-03-20 MED ORDER — INFLIXIMAB 100 MG IV SOLR: 5.0000 mg/kg | Freq: Once | INTRAVENOUS | Status: DC

## 2015-03-20 MED ORDER — SODIUM CHLORIDE 0.9 % IV SOLN
5.0000 mg/kg | Freq: Once | INTRAVENOUS | Status: AC
  Administered 2015-03-20: 400 mg via INTRAVENOUS
  Filled 2015-03-20: qty 40

## 2015-03-20 MED ORDER — ACETAMINOPHEN 325 MG PO TABS
ORAL_TABLET | ORAL | Status: AC
  Administered 2015-03-20: 650 mg via ORAL
  Filled 2015-03-20: qty 2

## 2015-04-16 ENCOUNTER — Encounter (HOSPITAL_COMMUNITY): Payer: BLUE CROSS/BLUE SHIELD

## 2015-05-05 ENCOUNTER — Ambulatory Visit (INDEPENDENT_AMBULATORY_CARE_PROVIDER_SITE_OTHER): Payer: BLUE CROSS/BLUE SHIELD | Admitting: Internal Medicine

## 2015-05-05 ENCOUNTER — Encounter: Payer: Self-pay | Admitting: Internal Medicine

## 2015-05-05 VITALS — BP 110/60 | HR 64 | Temp 98.2°F | Resp 16 | Ht 66.5 in | Wt 195.8 lb

## 2015-05-05 DIAGNOSIS — K50913 Crohn's disease, unspecified, with fistula: Secondary | ICD-10-CM | POA: Diagnosis not present

## 2015-05-05 DIAGNOSIS — K219 Gastro-esophageal reflux disease without esophagitis: Secondary | ICD-10-CM | POA: Diagnosis not present

## 2015-05-05 DIAGNOSIS — I4891 Unspecified atrial fibrillation: Secondary | ICD-10-CM

## 2015-05-05 DIAGNOSIS — E669 Obesity, unspecified: Secondary | ICD-10-CM

## 2015-05-05 NOTE — Patient Instructions (Signed)
We will check the lab results from this summer from the GI doctor and we review your old records.   Come back before the end of the year for a physical and call us if you have new questions or problems.   Let us know about the results of the MRI and if they are not able to determine what is going on with your eye.

## 2015-05-05 NOTE — Progress Notes (Signed)
Pre visit review using our clinic review tool, if applicable. No additional management support is needed unless otherwise documented below in the visit note. 

## 2015-05-07 DIAGNOSIS — I4891 Unspecified atrial fibrillation: Secondary | ICD-10-CM

## 2015-05-07 DIAGNOSIS — K5090 Crohn's disease, unspecified, without complications: Secondary | ICD-10-CM | POA: Insufficient documentation

## 2015-05-07 DIAGNOSIS — K219 Gastro-esophageal reflux disease without esophagitis: Secondary | ICD-10-CM | POA: Insufficient documentation

## 2015-05-07 DIAGNOSIS — I480 Paroxysmal atrial fibrillation: Secondary | ICD-10-CM | POA: Insufficient documentation

## 2015-05-07 DIAGNOSIS — E669 Obesity, unspecified: Secondary | ICD-10-CM | POA: Insufficient documentation

## 2015-05-07 DIAGNOSIS — Z8679 Personal history of other diseases of the circulatory system: Secondary | ICD-10-CM | POA: Insufficient documentation

## 2015-05-07 NOTE — Assessment & Plan Note (Signed)
Follows with GI for care and is due for repeat exam for fistula repair possibly, still with anal discharge from it. On therapy and no crohn's symptoms or flare. Not on prednisone anymore.

## 2015-05-07 NOTE — Assessment & Plan Note (Signed)
Controlled on zantac. Watches eating late at night as this is a risk for him.

## 2015-05-07 NOTE — Assessment & Plan Note (Signed)
Follows with cardiology and on flecainide and metorpolol. Rate at goal and normal sinus on exam today. On aspirin for stroke prevention due to low chad-s score.

## 2015-05-07 NOTE — Assessment & Plan Note (Signed)
Talked to him about the need for regular exercise and consistency in diet for his GI health and preventing disease. He is working on the exercise.

## 2015-05-07 NOTE — Progress Notes (Signed)
   Subjective:    Patient ID: Hunter Edwards, male    DOB: 03/12/1956, 59 y.o.   MRN: [id number]  HPI The patient is a 59 YO man who is coming in for follow up of his chronic conditions. He has crohn's disease (complicated by fistulas and s/p surgery earlier this year, on immunotherapy), a fib (follows with cardiology, takes flecainide and metoprolol and aspirin, not complicated), and his obesity (not exercising well, weight increasing over the last several years, diet okay to poor depending on the week). He has no new complaints.   PMH, [facility], social history reviewed and updated.   Review of Systems  Constitutional: Negative for fever, activity change, appetite change, fatigue and unexpected weight change.  HENT: Negative.   Eyes: Negative.   Respiratory: Negative for cough, chest tightness, shortness of breath and wheezing.   Cardiovascular: Negative for chest pain, palpitations and leg swelling.  Gastrointestinal: Negative for nausea, abdominal pain, diarrhea, constipation and abdominal distention.  Musculoskeletal: Negative.   Skin: Negative.   Neurological: Negative.   Psychiatric/Behavioral: Negative.       Objective:   Physical Exam  Constitutional: He is oriented to person, place, and time. He appears well-developed and well-nourished.  Overweight  HENT:  Head: Normocephalic and atraumatic.  Eyes: EOM are normal.  Neck: Normal range of motion.  Cardiovascular: Normal rate and regular rhythm.   Pulmonary/Chest: Effort normal. No respiratory distress. He has no wheezes. He has no rales.  Abdominal: Soft. Bowel sounds are normal. He exhibits no distension. There is no tenderness.  Musculoskeletal: He exhibits no edema.  Neurological: He is alert and oriented to person, place, and time. Coordination normal.  Skin: Skin is warm and dry.  Psychiatric: He has a normal mood and affect.   Filed Vitals:   05/05/15 1456  BP: 110/60  Pulse: 64  Temp: 98.2 F (36.8 C)  TempSrc:  Oral  Resp: 16  Height: 5' 6.5" (1.689 m)  Weight: 195 lb 12.8 oz (88.814 kg)  SpO2: 98%      Assessment & Plan:

## 2015-05-16 ENCOUNTER — Encounter (HOSPITAL_COMMUNITY)
Admission: RE | Admit: 2015-05-16 | Discharge: 2015-05-16 | Disposition: A | Discharge status: Home / Self Care | Payer: BLUE CROSS/BLUE SHIELD | Source: Ambulatory Visit | Attending: Internal Medicine | Admitting: Internal Medicine

## 2015-05-16 DIAGNOSIS — K50113 Crohn's disease of large intestine with fistula: Secondary | ICD-10-CM | POA: Insufficient documentation

## 2015-05-16 MED ORDER — SODIUM CHLORIDE 0.9 % IV SOLN
5.0000 mg/kg | Freq: Once | INTRAVENOUS | Status: AC
  Administered 2015-05-16: 400 mg via INTRAVENOUS
  Filled 2015-05-16: qty 40

## 2015-05-16 MED ORDER — SODIUM CHLORIDE 0.9 % IV SOLN
Freq: Once | INTRAVENOUS | Status: AC
  Administered 2015-05-16: 09:00:00 via INTRAVENOUS

## 2015-05-16 MED ORDER — DIPHENHYDRAMINE HCL 25 MG PO CAPS: 50.0000 mg | ORAL_CAPSULE | Freq: Once | ORAL | Status: DC

## 2015-05-16 MED ORDER — ACETAMINOPHEN 325 MG PO TABS
650.0000 mg | ORAL_TABLET | Freq: Once | ORAL | Status: AC
  Administered 2015-05-16: 650 mg via ORAL

## 2015-05-16 MED ORDER — ACETAMINOPHEN 325 MG PO TABS
ORAL_TABLET | ORAL | Status: AC
  Administered 2015-05-16: 650 mg via ORAL
  Filled 2015-05-16: qty 2

## 2015-05-28 ENCOUNTER — Telehealth: Payer: Self-pay

## 2015-05-28 NOTE — Telephone Encounter (Signed)
Left message for pt to call back.  Spoke with pt and he is aware. 

## 2015-05-28 NOTE — Telephone Encounter (Signed)
-----   Message from Algernon Huxley, RN sent at 02/28/2015 11:48 AM EDT ----- Regarding: Labs Pt needs labs in 3 mths, orders in epic.

## 2015-06-09 ENCOUNTER — Telehealth: Payer: Self-pay | Admitting: Internal Medicine

## 2015-06-11 DIAGNOSIS — I10 Essential (primary) hypertension: Secondary | ICD-10-CM | POA: Insufficient documentation

## 2015-06-16 ENCOUNTER — Encounter: Payer: Self-pay | Admitting: Internal Medicine

## 2015-06-16 ENCOUNTER — Other Ambulatory Visit (INDEPENDENT_AMBULATORY_CARE_PROVIDER_SITE_OTHER): Payer: BLUE CROSS/BLUE SHIELD

## 2015-06-16 ENCOUNTER — Ambulatory Visit (INDEPENDENT_AMBULATORY_CARE_PROVIDER_SITE_OTHER): Payer: BLUE CROSS/BLUE SHIELD | Admitting: Internal Medicine

## 2015-06-16 VITALS — BP 138/78 | HR 59 | Temp 97.6°F | Wt 195.5 lb

## 2015-06-16 DIAGNOSIS — Z0000 Encounter for general adult medical examination without abnormal findings: Secondary | ICD-10-CM

## 2015-06-16 DIAGNOSIS — D509 Iron deficiency anemia, unspecified: Secondary | ICD-10-CM

## 2015-06-16 LAB — COMPREHENSIVE METABOLIC PANEL
ALT: 16 U/L (ref 0–53)
AST: 17 U/L (ref 0–37)
Albumin: 3.7 g/dL (ref 3.5–5.2)
Alkaline Phosphatase: 60 U/L (ref 39–117)
BUN: 10 mg/dL (ref 6–23)
CO2: 30 mEq/L (ref 19–32)
Calcium: 9 mg/dL (ref 8.4–10.5)
Chloride: 104 mEq/L (ref 96–112)
Creatinine, Ser: 1.03 mg/dL (ref 0.40–1.50)
GFR: 78.29 mL/min (ref >60.00)
Glucose, Bld: 87 mg/dL (ref 70–99)
Potassium: 4.5 mEq/L (ref 3.5–5.1)
Sodium: 140 mEq/L (ref 135–145)
Total Bilirubin: 1.1 mg/dL (ref 0.2–1.2)
Total Protein: 6.5 g/dL (ref 6.0–8.3)

## 2015-06-16 LAB — CBC WITH DIFFERENTIAL/PLATELET
Basophils Absolute: 0 10*3/uL (ref 0.0–0.1)
Basophils Relative: 0.5 % (ref 0.0–3.0)
Eosinophils Absolute: 0.2 10*3/uL (ref 0.0–0.7)
Eosinophils Relative: 2.1 % (ref 0.0–5.0)
HCT: 43.7 % (ref 39.0–52.0)
Hemoglobin: 15 g/dL (ref 13.0–17.0)
Lymphocytes Relative: 24.9 % (ref 12.0–46.0)
Lymphs Abs: 2.3 10*3/uL (ref 0.7–4.0)
MCHC: 34.3 g/dL (ref 30.0–36.0)
MCV: 87.4 fl (ref 78.0–100.0)
Monocytes Absolute: 1 10*3/uL (ref 0.1–1.0)
Monocytes Relative: 10.7 % (ref 3.0–12.0)
Neutro Abs: 5.8 10*3/uL (ref 1.4–7.7)
Neutrophils Relative %: 61.8 % (ref 43.0–77.0)
Platelets: 183 10*3/uL (ref 150.0–400.0)
RBC: 5 Mil/uL (ref 4.22–5.81)
RDW: 14.2 % (ref 11.5–15.5)
WBC: 9.4 10*3/uL (ref 4.0–10.5)

## 2015-06-16 LAB — IBC PANEL
Iron: 114 ug/dL (ref 42–165)
Saturation Ratios: 37.5 % (ref 20.0–50.0)
Transferrin: 217 mg/dL (ref 212.0–360.0)

## 2015-06-16 LAB — FERRITIN: Ferritin: 76.4 ng/mL (ref 22.0–322.0)

## 2015-06-16 LAB — LIPID PANEL
Cholesterol: 222 mg/dL — ABNORMAL HIGH (ref 0–200)
HDL: 64.8 mg/dL (ref >39.00)
LDL Cholesterol: 130 mg/dL — ABNORMAL HIGH (ref 0–99)
NonHDL: 157.64
Total CHOL/HDL Ratio: 3
Triglycerides: 137 mg/dL (ref 0.0–149.0)
VLDL: 27.4 mg/dL (ref 0.0–40.0)

## 2015-06-16 NOTE — Assessment & Plan Note (Signed)
Currently on remicaid so he cannot get shingles, non-smoker. BP okay. Checking labs today. Not exercising now and counseled him about the importance of that on his health. Talked to him about sun safety in decreasing his risk of skin cancer. 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

## 2015-06-16 NOTE — Progress Notes (Signed)
   Subjective:    Patient ID: Hunter Edwards, male    DOB: 31-Jul-1956, 59 y.o.   MRN: [id number]  HPI The patient is a 59 YO man coming in for wellness. No complaints and no new problems. Non-smoker.   PMH, [facility], social history reviewed and updated.   Review of Systems  Constitutional: Negative for fever, activity change, appetite change, fatigue and unexpected weight change.  HENT: Negative.   Eyes: Negative.   Respiratory: Negative for cough, chest tightness, shortness of breath and wheezing.   Cardiovascular: Negative for chest pain, palpitations and leg swelling.  Gastrointestinal: Negative for nausea, abdominal pain, diarrhea, constipation and abdominal distention.  Musculoskeletal: Negative.   Skin: Negative.   Neurological: Negative.   Psychiatric/Behavioral: Negative.       Objective:   Physical Exam  Constitutional: He is oriented to person, place, and time. He appears well-developed and well-nourished.  Overweight  HENT:  Head: Normocephalic and atraumatic.  Eyes: EOM are normal.  Neck: Normal range of motion.  Cardiovascular: Normal rate and regular rhythm.   Pulmonary/Chest: Effort normal. No respiratory distress. He has no wheezes. He has no rales.  Abdominal: Soft. Bowel sounds are normal. He exhibits no distension. There is no tenderness.  Musculoskeletal: He exhibits no edema.  Neurological: He is alert and oriented to person, place, and time. Coordination normal.  Skin: Skin is warm and dry.  Psychiatric: He has a normal mood and affect.   Filed Vitals:   06/16/15 1315  BP: 138/78  Pulse: 59  Temp: 97.6 F (36.4 C)  Weight: 195 lb 8 oz (88.678 kg)  SpO2: 98%      Assessment & Plan:

## 2015-06-16 NOTE — Patient Instructions (Signed)
We will check the blood work and call you back with the results.   Come back in about 1 year for a check up, if you have any problems or questions please call us back.   Healthcare-Associated MRSA HA-MRSA stands for healthcare-associated methicillin-resistant Staphylococcus aureus. MRSA is a type of bacteria that is resistant to some common antibiotics. It can cause infections in the skin and many other places in the body. Staphylococcus aureus, often called "staph," is a bacteria that normally lives on the skin or in the nose. Staph on the surface of the skin or in the nose does not cause problems. However, if the staph enters the body through a cut, wound, or break in the skin, an infection can happen. Up until recently, infections with the MRSA type of staph mainly occurred in hospitals and other healthcare settings. There are now increasing problems with MRSA infections in the community as well. Infections with MRSA may be very serious or even life-threatening. HA-MRSA can be acquired by people in any healthcare setting. MRSA can be a big problem for hospitalized people, people in nursing homes, people in rehabilitation facilities, people with weakened immune systems, dialysis patients, and those who have had surgery. CAUSES  All staph, including MRSA, are normally harmless unless they enter the body through a scratch, cut, or wound, such as with surgery. All staph, including MRSA, can be spread from person-to-person by touching contaminated objects or through direct contact.   HA-MRSA happens commonly among people in hospitals and people receiving care in other healthcare facilities.  If someone gets a MRSA infection, it is likely to be HA-MRSA if:  He or she has had frequent or recent contact with hospitals or healthcare facilities (such as nursing homes or dialysis centers) within the previous year.  He or she recently had an invasive medical procedure (including having a tube placed in the  body, such as a urinary catheter or a central line IV catheter). The chance of getting MRSA can be higher for someone who gets kidney dialysis, has a long-term vascular access device, or has a long-term urinary catheter.  HA-MRSA infections acquired in hospitals and healthcare settings can be severe. Certain things can put some patients at higher risk for acquiring HA-MRSA. These include:  Prolonged hospital stay.  Receiving broad-spectrum antibiotics or long or repeat courses of antibiotics.  Being hospitalized in an intensive care or burn unit.  Spending time close to other patients with HA-MRSA.  Having recent surgery.  Having tubes or catheters in the body as part of care.  Carrying MRSA in the nose without developing illness.  A common method of contracting HA-MRSA is through direct contact with a person who is colonized with MRSA.  Another common method of contracting HA-MRSA is through skin contact with a surface that is contaminated with MRSA. You may be screened for MRSA on your skin or in your nose at the time of admission or treatment. This depends on where you live and why you are receiving care. DIAGNOSIS  Diagnosis of MRSA is done by cultures of fluid samples that may come from:  Swabs taken from cuts or wounds in infected areas.  Nasal swabs.  Saliva or deep cough specimens from the lungs (sputum).  Urine.  Blood. Many people are "colonized" with MRSA but have no signs of infection. This means that people carry the MRSA germ on their skin or in their nose and may never develop MRSA infection.  TREATMENT  Treatment varies and is  based on how serious, how deep, or how extensive the infection is. For example:  Some skin infections, such as a small boil or abscess, may be treated by draining yellowish-white fluid (pus) from the site of the infection.  Deeper or more widespread soft tissue infections are usually treated with surgery to drain pus and with antibiotic  medicine given by vein or by mouth. This may be recommended even if you are pregnant.  Serious infections may require a hospital stay. If antibiotics are given, they may be needed for several weeks. PREVENTION  Because many people are colonized with staph, including MRSA, preventing the spread of the bacteria from person-to-person is most important. The best way to prevent the spread of bacteria and other germs is through proper hand washing or by using alcohol-based hand disinfectants. The following are other ways to help prevent MRSA infection within the hospital and healthcare settings.   Strict hand washing or hand disinfection procedures need to be followed before and after touching every patient.  Patients infected with MRSA are placed in isolation to prevent the spread of the bacteria.  Healthcare workers need to wear disposable gowns and gloves when touching or caring for patients infected with MRSA. Visitors may also be asked to wear a gown and gloves.  Hospital surfaces need to be disinfected frequently.  If you are breastfeeding, talk to your caregiver about MRSA. You may be asked to temporarily stop breastfeeding. HOME CARE INSTRUCTIONS   Take your antibiotics as directed. Finish them even if you start to feel better.  Avoid close contact with those around you as much as possible. Do not use towels, razors, toothbrushes, other personal hygiene products, bedding, or other items that will be used by others.  To fight the infection, follow your caregiver's instructions for wound care. Wash your hands before and after changing your bandages.  Wash your hands frequently with soap and water for at least 15 seconds. Otherwise, use alcohol-based hand disinfectants when soap and water is not available.  Make sure people who live with you wash their hands often, too.  Wash and dry your clothes and bedding at the warmest temperatures recommended on the labels.  If you have an  intravascular device, such as a catheter, make sure you know how to care for it.  Be sure to tell any health care providers that you have MRSA so they are aware of your infection. SEEK IMMEDIATE MEDICAL CARE IF:   The infection appears to be getting worse. Signs include:  Increased warmth, redness, or tenderness around the wound site.  A red line that extends from the infection site.  A dark color in the area around the infection.  Wound drainage that is tan, yellow, or green.  A bad smell coming from the wound.  You feel sick to your stomach (nauseous) and throw up (vomit) or cannot keep medicine down.  You have a fever.  Your baby is older than 3 months with a rectal temperature of 102 F (38.9 C) or higher.  Your baby is 60 months old or younger with a rectal temperature of 100.4 F (38 C) or higher.  You have difficulty breathing. MAKE SURE YOU:  Understand these instructions.  Will watch your condition.  Will get help right away if you are not doing well or get worse. Document Released: 08/10/2008 Document Revised: 08/22/2013 Document Reviewed: 02/04/2011 [facility] Patient Information 2015 [place], [place]. This information is not intended to replace advice given to you by your health  care provider. Make sure you discuss any questions you have with your health care provider.

## 2015-06-16 NOTE — Progress Notes (Signed)
Pre visit review using our clinic review tool, if applicable. No additional management support is needed unless otherwise documented below in the visit note. 

## 2015-06-17 ENCOUNTER — Other Ambulatory Visit: Payer: Self-pay

## 2015-06-17 DIAGNOSIS — K5090 Crohn's disease, unspecified, without complications: Secondary | ICD-10-CM

## 2015-06-25 ENCOUNTER — Encounter: Payer: Self-pay | Admitting: *Deleted

## 2015-06-30 ENCOUNTER — Telehealth: Payer: Self-pay

## 2015-06-30 NOTE — Telephone Encounter (Signed)
Spoke with pt and he is aware. Pt still wants to hold Remicade and will keep follow-up appt.

## 2015-06-30 NOTE — Telephone Encounter (Signed)
-----   Message from Jerene Bears, MD sent at 06/30/2015  1:59 PM EDT ----- Regarding: RE: Remicade I still think he needs to get Remicade, but I will not force the issue Derm referral can be made if rash persists Followup with me ----- Message -----    From: Algernon Huxley, RN    Sent: 06/30/2015   1:23 PM      To: Jerene Bears, MD Subject: RE: Remicade                                   Spoke with pt and he states that he had a history of a red itchy rash over the years but he had it under "control." States that after his last remicade he has broken out bad. States it is getting better but he really wants to wait to talk to Dr. Hilarie Fredrickson before having another Remicade. Pt states he has not seen a dermatologist yet but in the past he has been told by derm that he is "allergic to something in the air." Please advise.   ----- Message -----    From: Jerene Bears, MD    Sent: 06/29/2015   7:03 PM      To: Algernon Huxley, RN Subject: RE: Remicade                                   No Please have him get Remicade as scheduled on 07/10/15 and we will discuss at followup This works best as a longterm med Disease would likely flare with discontinuation JMP   ----- Message -----    From: Algernon Huxley, RN    Sent: 06/26/2015   9:37 AM      To: Jerene Bears, MD Subject: FW: Remicade                                   Dr. Edrick Kins,  Pt is due for next Remicade 07/10/15. States he wants to wait until he sees you for an OV which is scheduled 07/30/15@3pm  to see "if he needs to continue the Remicade." Don't think he quite gets it. Are you ok with him holding it until visit?  Thanks, Dag  ----- Message -----    From: Algernon Huxley, RN    Sent: 06/16/2015      To: Algernon Huxley, RN Subject: FW: Remicade                                     ----- Message -----    From: Algernon Huxley, RN    Sent: 05/12/2015      To: Algernon Huxley, RN Subject: Remicade                                       Pt needs remicade  07/10/15

## 2015-07-30 ENCOUNTER — Ambulatory Visit (INDEPENDENT_AMBULATORY_CARE_PROVIDER_SITE_OTHER): Payer: BLUE CROSS/BLUE SHIELD | Admitting: Internal Medicine

## 2015-07-30 ENCOUNTER — Encounter: Payer: Self-pay | Admitting: Internal Medicine

## 2015-07-30 VITALS — BP 120/76 | HR 60 | Ht 66.5 in | Wt 199.0 lb

## 2015-07-30 DIAGNOSIS — K603 Anal fistula: Secondary | ICD-10-CM

## 2015-07-30 DIAGNOSIS — K50913 Crohn's disease, unspecified, with fistula: Secondary | ICD-10-CM | POA: Diagnosis not present

## 2015-07-30 DIAGNOSIS — Z79899 Other long term (current) drug therapy: Secondary | ICD-10-CM | POA: Diagnosis not present

## 2015-07-30 DIAGNOSIS — Z23 Encounter for immunization: Secondary | ICD-10-CM

## 2015-07-30 DIAGNOSIS — L309 Dermatitis, unspecified: Secondary | ICD-10-CM

## 2015-07-30 DIAGNOSIS — D509 Iron deficiency anemia, unspecified: Secondary | ICD-10-CM

## 2015-07-30 DIAGNOSIS — R21 Rash and other nonspecific skin eruption: Secondary | ICD-10-CM

## 2015-07-30 MED ORDER — NA SULFATE-K SULFATE-MG SULF 17.5-3.13-1.6 GM/177ML PO SOLN: ORAL | Status: DC

## 2015-07-30 NOTE — Patient Instructions (Addendum)
You have been scheduled for a colonoscopy. Please follow written instructions given to you at your visit today.  Please pick up your prep supplies at the pharmacy within the next 1-3 days. If you use inhalers (even only as needed), please bring them with you on the day of your procedure. Your physician has requested that you go to www.startemmi.com and enter the access code given to you at your visit today. This web site gives a general overview about your procedure. However, you should still follow specific instructions given to you by our office regarding your preparation for the procedure.  Please call Dr Marcello Moores to set up a follow up. Her phone number is [phone].  You will be scheduled for another remicade infusion.  Should you get a rash after remicade infusion, please call to let us know immediately.  We have given you a flu shot today as well as your last of 3 Hepatitis B injections.

## 2015-07-30 NOTE — Progress Notes (Signed)
Subjective:    Patient ID: Hunter Edwards, male    DOB: [birth year], 59 y.o.   MRN: [id number]  HPI Hunter Edwards is a 59 year old male with Fistulizing perianal and colonic Crohn's disease diagnosed in 0932 complicated by perirectal abscess and fistula formation status post fistulotomy who is seen for follow-up. He was initially seen by me on 02/27/2015 and returns for follow-up. He is alone today. He has been started on Remicade and is status post the 3 induction doses. His last dose was the first week of July 2016. He has weaned off prednisone entirely. He reports he is feeling much better at this time and the Remicade really seem to help. He developed a rash in early July which occurred after his third Remicade infusion. This is a rash that he has had on and off for over 20 years. This is been evaluated by dermatology locally and at [facility]. It has been biopsied several times in the past. Previously prednisone made this rash better but it often rebounded "with a vengeance" when prednisone was withdrawn. This rash is intensely pruritic and has resolved slowly. Again bilateral arms across his upper back and neck were involved. He also had a patch behind his right knee. This is getting better.  Bowel movements a been regular. He still having some nonbloody drainage from what he feels is a perianal fistula. He is planning to contact Hunter Edwards who performed his fistulotomy. He is seeing some minor rectal bleeding on the stool but not in it. Denies abdominal pain. Good appetite. No nausea or vomiting. Denies heartburn.   Review of Systems As per HPI, otherwise negative  Current Medications, Allergies, Past Medical History, Past Surgical History, Family History and Social History were reviewed in [employer] record.     Objective:   Physical Exam BP 120/76 mmHg  Pulse 60  Ht 5' 6.5" (1.689 m)  Wt 199 lb (90.266 kg)  BMI 31.64 kg/m2 Constitutional:  Well-developed and well-nourished. No distress. HEENT: Normocephalic and atraumatic. Oropharynx is clear and moist. No oropharyngeal exudate. Conjunctivae are normal.  No scleral icterus. Neck: Neck supple. Trachea midline. Cardiovascular: Normal rate, regular rhythm and intact distal pulses. No M/R/G Pulmonary/chest: Effort normal and breath sounds normal. No wheezing, rales or rhonchi. Abdominal: Soft, nontender, nondistended. Bowel sounds active throughout. There are no masses palpable. No hepatosplenomegaly. Extremities: no clubbing, cyanosis, or edema Lymphadenopathy: No cervical adenopathy noted. Neurological: Alert and oriented to person place and time. Skin: Skin is warm and dry. Patchy red raised rash with tiny scabs on the left upper back right upper arm posterior aspect Psychiatric: Normal mood and affect. Behavior is normal.  CBC    Component Value Date/Time   WBC 9.4 06/16/2015 1340   RBC 5.00 06/16/2015 1340   HGB 15.0 06/16/2015 1340   HCT 43.7 06/16/2015 1340   PLT 183.0 06/16/2015 1340   MCV 87.4 06/16/2015 1340   MCHC 34.3 06/16/2015 1340   RDW 14.2 06/16/2015 1340   LYMPHSABS 2.3 06/16/2015 1340   MONOABS 1.0 06/16/2015 1340   EOSABS 0.2 06/16/2015 1340   BASOSABS 0.0 06/16/2015 1340    CMP     Component Value Date/Time   NA 140 06/16/2015 1338   K 4.5 06/16/2015 1338   CL 104 06/16/2015 1338   CO2 30 06/16/2015 1338   GLUCOSE 87 06/16/2015 1338   BUN 10 06/16/2015 1338   CREATININE 1.03 06/16/2015 1338   CALCIUM 9.0 06/16/2015 1338   PROT  6.5 06/16/2015 1338   ALBUMIN 3.7 06/16/2015 1338   AST 17 06/16/2015 1338   ALT 16 06/16/2015 1338   ALKPHOS 60 06/16/2015 1338   BILITOT 1.1 06/16/2015 1338   Prior colonoscopy December 2014 by HunterShearen revealed focal colitis approximately 50 cm from the anal verge in the descending colon. There was also very distal proctitis with fistulous tracts near the anal canal  February 2016 again having rectal pain,  diarrhea and bleeding. CT scan of the abdomen is performed which showed bowel wall thickening involving the left colon greatest in the descending colon with pericolonic stranding but no frank perforation or abscess was seen.    Assessment & Plan:   59 year old male with Fistulizing perianal and colonic Crohn's disease diagnosed in 0998 complicated by perirectal abscess and fistula formation status post fistulotomy who is seen for follow-up.   1. Crohn's colitis with perianal Crohn's disease compensated by fistula formation -- clinically he improved with Remicade. He has been hesitant to continue this medication over fear of his rash and also long-term side effects with this medicine. He tolerated all 3 infusions well without incident. Clinically the medication was helping him. He has weaned off steroids. See #2 regarding rash. Long discussion today regarding perianal Crohn's disease with fistula and the need to treat this aggressively with anti-TNF therapy. If we stop this therapy his disease is very high risk to recur with further comorbidity associated with this disease. He does seem to understand this well --Resume Remicade 5 mg/kg ASAP and continue q 8 weeks --Avoid NSAIDs --Repeat colonoscopy to reassess disease activity. Procedure discussed including the risks benefits and alternatives and he agrees to proceed --Flu shot --3rd and final Hep B shot --Follow-up with Hunter Edwards regarding fistula which continues to drain (this will be reassessed at upcoming colonoscopy)  2. Rash -- records from [facility] reviewed. 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  He has had extensive allergy testing and the only thing positive was to neomycin. I have discussed the case with Hunter Edwards with [facility] dermatology. She has not seen the patient before. It is felt that his rash is most likely psoriatic in nature and the rebound reaction he had in July is felt most likely  secondary to withdrawal of steroids rather than a reaction to Remicade. This rash dates back greater than 20 years and has rebounded previously when coming off steroids. With this in mind will continue with Remicade as discussed in #1. If rash recurs will needed discontinue Remicade altogether and refer him to dermatology. The patient understands and is happy with this plan. He is asked to call me if rash worsens.  3. IDA -- improved  Return in 4-6 months, sooner if necessary 40 minutes spent with patient

## 2015-07-31 ENCOUNTER — Telehealth: Payer: Self-pay

## 2015-07-31 ENCOUNTER — Other Ambulatory Visit: Payer: Self-pay

## 2015-07-31 DIAGNOSIS — K5090 Crohn's disease, unspecified, without complications: Secondary | ICD-10-CM

## 2015-07-31 MED ORDER — ACETAMINOPHEN 325 MG PO TABS: 650.0000 mg | ORAL_TABLET | ORAL | Status: DC

## 2015-07-31 MED ORDER — SODIUM CHLORIDE 0.9 % IV SOLN: 5.0000 mg/kg | INTRAVENOUS | Status: DC

## 2015-07-31 MED ORDER — DIPHENHYDRAMINE HCL 25 MG PO TABS: 50.0000 mg | ORAL_TABLET | ORAL | Status: DC

## 2015-07-31 NOTE — Telephone Encounter (Signed)
Pt scheduled for next [facility]ade treatment at [facility] 08/15/15@9am . Orders in epic and pt aware.

## 2015-08-14 ENCOUNTER — Other Ambulatory Visit: Payer: Self-pay

## 2015-08-14 DIAGNOSIS — K5090 Crohn's disease, unspecified, without complications: Secondary | ICD-10-CM

## 2015-08-15 ENCOUNTER — Ambulatory Visit (HOSPITAL_COMMUNITY)
Admission: RE | Admit: 2015-08-15 | Discharge: 2015-08-15 | Disposition: A | Discharge status: Home / Self Care | Payer: BLUE CROSS/BLUE SHIELD | Source: Ambulatory Visit | Attending: Internal Medicine | Admitting: Internal Medicine

## 2015-08-15 VITALS — BP 126/69 | HR 48 | Temp 97.7°F | Resp 20 | Ht 66.5 in | Wt 193.0 lb

## 2015-08-15 DIAGNOSIS — K5090 Crohn's disease, unspecified, without complications: Secondary | ICD-10-CM | POA: Diagnosis not present

## 2015-08-15 MED ORDER — DIPHENHYDRAMINE HCL 25 MG PO CAPS
50.0000 mg | ORAL_CAPSULE | ORAL | Status: DC
  Administered 2015-08-15: 50 mg via ORAL

## 2015-08-15 MED ORDER — DIPHENHYDRAMINE HCL 25 MG PO CAPS
ORAL_CAPSULE | ORAL | Status: AC
  Filled 2015-08-15: qty 2

## 2015-08-15 MED ORDER — ACETAMINOPHEN 325 MG PO TABS
650.0000 mg | ORAL_TABLET | ORAL | Status: DC
  Administered 2015-08-15: 650 mg via ORAL

## 2015-08-15 MED ORDER — ACETAMINOPHEN 325 MG PO TABS
ORAL_TABLET | ORAL | Status: AC
  Filled 2015-08-15: qty 2

## 2015-08-15 MED ORDER — SODIUM CHLORIDE 0.9 % IV SOLN
5.0000 mg/kg | INTRAVENOUS | Status: DC
  Administered 2015-08-15: 400 mg via INTRAVENOUS
  Filled 2015-08-15: qty 40

## 2015-08-26 ENCOUNTER — Telehealth: Payer: Self-pay | Admitting: *Deleted

## 2015-08-26 NOTE — Telephone Encounter (Signed)
-----   Message from Jerene Bears, MD sent at 08/26/2015 12:16 PM EDT ----- Absolutely, Thanks Willis Modena,  Please add to our records JMP ----- Message -----    From: [name], MD    Sent: 08/26/2015  11:49 AM      To: Jerene Bears, MD  H had a large posterior anal rectal ulcer on exam today.  Are you okay with me starting mesalamine suppositories?  Elmo Putt

## 2015-08-26 NOTE — Telephone Encounter (Signed)
Med added to med list.

## 2015-09-22 ENCOUNTER — Telehealth: Payer: Self-pay

## 2015-09-22 NOTE — Telephone Encounter (Signed)
Pt aware.

## 2015-09-22 NOTE — Telephone Encounter (Signed)
-----   Message from Algernon Huxley, RN sent at 09/09/2015  9:18 AM EDT ----- Regarding: FW: Labs   ----- Message -----    From: Algernon Huxley, RN    Sent: 09/08/2015      To: Algernon Huxley, RN Subject: Labs                                           Pt needs labs, order in epic.

## 2015-09-26 ENCOUNTER — Ambulatory Visit (AMBULATORY_SURGERY_CENTER): Payer: BLUE CROSS/BLUE SHIELD | Admitting: Internal Medicine

## 2015-09-26 ENCOUNTER — Encounter: Payer: Self-pay | Admitting: Internal Medicine

## 2015-09-26 VITALS — BP 113/64 | HR 48 | Temp 96.0°F | Resp 25 | Ht 66.5 in | Wt 199.0 lb

## 2015-09-26 DIAGNOSIS — K50113 Crohn's disease of large intestine with fistula: Secondary | ICD-10-CM

## 2015-09-26 DIAGNOSIS — K635 Polyp of colon: Secondary | ICD-10-CM

## 2015-09-26 DIAGNOSIS — K529 Noninfective gastroenteritis and colitis, unspecified: Secondary | ICD-10-CM | POA: Diagnosis not present

## 2015-09-26 DIAGNOSIS — D127 Benign neoplasm of rectosigmoid junction: Secondary | ICD-10-CM

## 2015-09-26 DIAGNOSIS — K50013 Crohn's disease of small intestine with fistula: Secondary | ICD-10-CM

## 2015-09-26 DIAGNOSIS — K5140 Inflammatory polyps of colon without complications: Secondary | ICD-10-CM | POA: Diagnosis not present

## 2015-09-26 MED ORDER — MESALAMINE 1000 MG RE SUPP: 1000.0000 mg | Freq: Every day | RECTAL | Status: DC

## 2015-09-26 MED ORDER — SODIUM CHLORIDE 0.9 % IV SOLN: 500.0000 mL | INTRAVENOUS | Status: DC

## 2015-09-26 NOTE — Progress Notes (Signed)
A/ox3, pleased with MAC, report to RN 

## 2015-09-26 NOTE — Op Note (Signed)
[place]  [address] [place], [address]   COLONOSCOPY PROCEDURE REPORT  PATIENT: Hunter Edwards, Hunter Edwards  MR#: [id number] BIRTHDATE: 03-30-1956 , 50  yrs. old GENDER: male ENDOSCOPIST: Jerene Bears, MD PROCEDURE DATE:  09/26/2015 PROCEDURE:   Colonoscopy, diagnostic, Colonoscopy with biopsy, and Colonoscopy with snare polypectomy First Screening Colonoscopy - Avg.  risk and is 50 yrs.  old or older - No.  Prior Negative Screening - Now for repeat screening. N/A  History of Adenoma - Now for follow-up colonoscopy & has been > or = to 3 yrs.  N/A  Polyps removed today? Yes ASA CLASS:   Class II INDICATIONS: surveillance of known colonic Crohn's disease with Fistulizing perianal involvement MEDICATIONS: Monitored anesthesia care and Propofol 150 mg IV  DESCRIPTION OF PROCEDURE:   After the risks benefits and alternatives of the procedure were thoroughly explained, informed consent was obtained.  The digital rectal exam revealed a palpable posterior firmness known to be a Crohn's related ulceration.   The LB [id number]  endoscope was introduced through the anus and advanced to the terminal ileum which was intubated for a short distance. No adverse events experienced.   The quality of the prep was good.  (Suprep was used)  The instrument was then slowly withdrawn as the colon was fully examined. Estimated blood loss is zero unless otherwise noted in this procedure report.      COLON FINDINGS: The examined terminal ileum appeared to be normal. Evidence of prior inflammation seen in the descending and sigmoid colon.  There are areas of mild erythema without active ulceration. Multiple pseudopolyps are present in the left colon.  Multiple biopsies were obtained throughout the segment to assess disease activity and rule out dysplasia.   Normal-appearing cecum, ascending colon and transverse colon.   A pedunculated polyp measuring 7 mm in size was found in the  rectosigmoid colon.  A polypectomy was performed using snare cautery.  The resection was complete, the polyp tissue was completely retrieved and sent to histology.   Erythema and edema seen on retroflexion at the anal canal consistent with active perianal Crohn's disease.  Retroflexed views as previously described. The time to cecum = 2.0 Withdrawal time = 11.0   The scope was withdrawn and the procedure completed.  COMPLICATIONS: There were no immediate complications.  ENDOSCOPIC IMPRESSION: 1.   The examined terminal ileum appeared to be normal 2.   Evidence of prior inflammation seen in the descending and sigmoid colon.  There are areas of mild erythema without active ulceration.  Multiple pseudopolyps are present in the left colon. Multiple biopsies were obtained throughout the segment to assess disease activity and rule out dysplasia 3.   Normal-appearing cecum, ascending colon and transverse colon 4.   Pedunculated polyp was found in the rectosigmoid colon; polypectomy was performed using snare cautery 5.   Erythema and edema seen on retroflexion at the anal canal consistent with active perianal Crohn's disease  RECOMMENDATIONS: 1.  Continue Remicade as scheduled 2.  Await pathology results 3.  Continue Canasa suppository 1 g daily at bedtime 4.  Follow-up with me next available and with Dr.  Marcello Moores for reassessment of your perianal ulceration 5.  Avoid NSAIDs  eSigned:  Jerene Bears, MD 09/26/2015 4:42 PM   cc: The Patient, Dr. Sharlet Salina (PCP), Dr. [name] [facility] Surgery)   PATIENT NAME:  Hunter Edwards, Hunter Edwards MR#: [id number]

## 2015-09-26 NOTE — Progress Notes (Signed)
Called to room to assist during endoscopic procedure.  Patient ID and intended procedure confirmed with present staff. Received instructions for my participation in the procedure from the performing physician.  

## 2015-09-26 NOTE — Patient Instructions (Addendum)
YOU HAD AN ENDOSCOPIC PROCEDURE TODAY AT [facility]:   Refer to the procedure report that was given to you for any specific questions about what was found during the examination.  If the procedure report does not answer your questions, please call your gastroenterologist to clarify.  If you requested that your care partner not be given the details of your procedure findings, then the procedure report has been included in a sealed envelope for you to review at your convenience later.  YOU SHOULD EXPECT: Some feelings of bloating in the abdomen. Passage of more gas than usual.  Walking can help get rid of the air that was put into your GI tract during the procedure and reduce the bloating. If you had a lower endoscopy (such as a colonoscopy or flexible sigmoidoscopy) you may notice spotting of blood in your stool or on the toilet paper. If you underwent a bowel prep for your procedure, you may not have a normal bowel movement for a few days.  Please Note:  You might notice some irritation and congestion in your nose or some drainage.  This is from the oxygen used during your procedure.  There is no need for concern and it should clear up in a day or so.  SYMPTOMS TO REPORT IMMEDIATELY:   Following lower endoscopy (colonoscopy or flexible sigmoidoscopy):  Excessive amounts of blood in the stool  Significant tenderness or worsening of abdominal pains  Swelling of the abdomen that is new, acute  Fever of 100F or higher   For urgent or emergent issues, a gastroenterologist can be reached at any hour by calling [phone].   DIET: Your first meal following the procedure should be a small meal and then it is ok to progress to your normal diet. Heavy or fried foods are harder to digest and may make you feel nauseous or bloated.  Likewise, meals heavy in dairy and vegetables can increase bloating.  Drink plenty of fluids but you should avoid alcoholic beverages for 24  hours.  ACTIVITY:  You should plan to take it easy for the rest of today and you should NOT DRIVE or use heavy machinery until tomorrow (because of the sedation medicines used during the test).    FOLLOW UP: Our staff will call the number listed on your records the next business day following your procedure to check on you and address any questions or concerns that you may have regarding the information given to you following your procedure. If we do not reach you, we will leave a message.  However, if you are feeling well and you are not experiencing any problems, there is no need to return our call.  We will assume that you have returned to your regular daily activities without incident.  If any biopsies were taken you will be contacted by phone or by letter within the next 1-3 weeks.  Please call us at [phone] if you have not heard about the biopsies in 3 weeks.    SIGNATURES/CONFIDENTIALITY: You and/or your care partner have signed paperwork which will be entered into your electronic medical record.  These signatures attest to the fact that that the information above on your After Visit Summary has been reviewed and is understood.  Full responsibility of the confidentiality of this discharge information lies with you and/or your care-partner.  Avoid NSAIDS.  Continue your Remicade and your canasa supps.  The supps were ordered for you and sent to your pharmacy.

## 2015-09-29 ENCOUNTER — Telehealth: Payer: Self-pay | Admitting: *Deleted

## 2015-09-29 NOTE — Telephone Encounter (Signed)
  Follow up Call-  Call back number 09/26/2015  Post procedure Call Back phone  # [phone]  Permission to leave phone message Yes     Patient questions:  Message left to call us if necessary.

## 2015-09-29 NOTE — Telephone Encounter (Signed)
Dr Elvera Lennox @ [facility] will see patient on Thursday, 10/02/15 @ 3:00. Patient is to arrive at 2:40 pm for registration. I have left a message for patient to call back.

## 2015-09-29 NOTE — Telephone Encounter (Signed)
I have spoken to patient to advise of appointment with Dr Elvera Lennox on 10/02/15 @ 3:00 pm. Patient verbalizes understanding. He has also been given location and phone number to [facility].

## 2015-09-29 NOTE — Telephone Encounter (Signed)
-----   Message from Jerene Bears, MD sent at 09/26/2015  5:07 PM EST ----- Regarding: derm referral Please refer to derm for rash in pt with Crohn's on remicade Rash has been present for months Needs derm opinion Appt if not emergent, but does not need to wait for months Thanks JMP

## 2015-09-30 ENCOUNTER — Other Ambulatory Visit (INDEPENDENT_AMBULATORY_CARE_PROVIDER_SITE_OTHER): Payer: BLUE CROSS/BLUE SHIELD

## 2015-09-30 DIAGNOSIS — K5090 Crohn's disease, unspecified, without complications: Secondary | ICD-10-CM

## 2015-09-30 LAB — CBC WITH DIFFERENTIAL/PLATELET
Basophils Absolute: 0 10*3/uL (ref 0.0–0.1)
Basophils Relative: 0.4 % (ref 0.0–3.0)
Eosinophils Absolute: 0.2 10*3/uL (ref 0.0–0.7)
Eosinophils Relative: 2.2 % (ref 0.0–5.0)
HCT: 44.5 % (ref 39.0–52.0)
Hemoglobin: 15 g/dL (ref 13.0–17.0)
Lymphocytes Relative: 30.7 % (ref 12.0–46.0)
Lymphs Abs: 3 10*3/uL (ref 0.7–4.0)
MCHC: 33.7 g/dL (ref 30.0–36.0)
MCV: 92.3 fl (ref 78.0–100.0)
Monocytes Absolute: 1.1 10*3/uL — ABNORMAL HIGH (ref 0.1–1.0)
Monocytes Relative: 11.1 % (ref 3.0–12.0)
Neutro Abs: 5.4 10*3/uL (ref 1.4–7.7)
Neutrophils Relative %: 55.6 % (ref 43.0–77.0)
Platelets: 218 10*3/uL (ref 150.0–400.0)
RBC: 4.83 Mil/uL (ref 4.22–5.81)
RDW: 12.5 % (ref 11.5–15.5)
WBC: 9.7 10*3/uL (ref 4.0–10.5)

## 2015-09-30 LAB — IBC PANEL
Iron: 70 ug/dL (ref 42–165)
Saturation Ratios: 21.7 % (ref 20.0–50.0)
Transferrin: 230 mg/dL (ref 212.0–360.0)

## 2015-09-30 LAB — FERRITIN: Ferritin: 33.7 ng/mL (ref 22.0–322.0)

## 2015-10-02 ENCOUNTER — Encounter: Payer: Self-pay | Admitting: Internal Medicine

## 2015-10-08 ENCOUNTER — Encounter (HOSPITAL_COMMUNITY)
Admission: RE | Admit: 2015-10-08 | Discharge: 2015-10-08 | Disposition: A | Discharge status: Home / Self Care | Payer: BLUE CROSS/BLUE SHIELD | Source: Ambulatory Visit | Attending: Internal Medicine | Admitting: Internal Medicine

## 2015-10-08 VITALS — BP 136/76 | HR 53 | Temp 97.9°F | Resp 20 | Ht 66.0 in | Wt 193.0 lb

## 2015-10-08 DIAGNOSIS — K5090 Crohn's disease, unspecified, without complications: Secondary | ICD-10-CM | POA: Diagnosis present

## 2015-10-08 MED ORDER — ACETAMINOPHEN 325 MG PO TABS: 650.0000 mg | ORAL_TABLET | ORAL | Status: DC

## 2015-10-08 MED ORDER — SODIUM CHLORIDE 0.9 % IV SOLN
5.0000 mg/kg | INTRAVENOUS | Status: DC
  Administered 2015-10-08: 400 mg via INTRAVENOUS
  Filled 2015-10-08: qty 40

## 2015-10-08 MED ORDER — DIPHENHYDRAMINE HCL 25 MG PO CAPS: 50.0000 mg | ORAL_CAPSULE | ORAL | Status: DC

## 2015-12-02 ENCOUNTER — Telehealth: Payer: Self-pay | Admitting: Internal Medicine

## 2015-12-02 NOTE — Telephone Encounter (Signed)
Appt moved to 12/19/15@9am . Pt aware.

## 2015-12-02 NOTE — Telephone Encounter (Signed)
Ok to delay until 12/19/15, resume q 8 weeks thereafter

## 2015-12-02 NOTE — Telephone Encounter (Signed)
Pt states he had an ablation on his heart yesterday and states the doctor told him he should move his remicade infusion back a couple of weeks. Pt is currently scheduled for 12/05/15, he would like to move it back to 12/19/15@9am . Dr. Hilarie Fredrickson please advise.

## 2015-12-05 ENCOUNTER — Encounter (HOSPITAL_COMMUNITY): Payer: BLUE CROSS/BLUE SHIELD

## 2015-12-18 ENCOUNTER — Other Ambulatory Visit: Payer: Self-pay

## 2015-12-18 DIAGNOSIS — K50919 Crohn's disease, unspecified, with unspecified complications: Secondary | ICD-10-CM

## 2015-12-19 ENCOUNTER — Encounter (HOSPITAL_COMMUNITY)
Admission: RE | Admit: 2015-12-19 | Discharge: 2015-12-19 | Disposition: A | Discharge status: Home / Self Care | Payer: BLUE CROSS/BLUE SHIELD | Source: Ambulatory Visit | Attending: Internal Medicine | Admitting: Internal Medicine

## 2015-12-19 VITALS — BP 130/83 | HR 78 | Temp 97.5°F | Resp 20 | Ht 66.5 in | Wt 190.0 lb

## 2015-12-19 DIAGNOSIS — K5090 Crohn's disease, unspecified, without complications: Secondary | ICD-10-CM | POA: Insufficient documentation

## 2015-12-19 DIAGNOSIS — K50919 Crohn's disease, unspecified, with unspecified complications: Secondary | ICD-10-CM

## 2015-12-19 MED ORDER — SODIUM CHLORIDE 0.9 % IV SOLN
5.0000 mg/kg | INTRAVENOUS | Status: DC
  Administered 2015-12-19: 400 mg via INTRAVENOUS
  Filled 2015-12-19: qty 40

## 2015-12-19 MED ORDER — DIPHENHYDRAMINE HCL 25 MG PO CAPS
ORAL_CAPSULE | ORAL | Status: AC
  Filled 2015-12-19: qty 2

## 2015-12-19 MED ORDER — DIPHENHYDRAMINE HCL 25 MG PO CAPS
50.0000 mg | ORAL_CAPSULE | ORAL | Status: DC
  Administered 2015-12-19: 50 mg via ORAL

## 2015-12-19 MED ORDER — ACETAMINOPHEN 325 MG PO TABS
ORAL_TABLET | ORAL | Status: AC
  Filled 2015-12-19: qty 2

## 2015-12-19 MED ORDER — ACETAMINOPHEN 325 MG PO TABS
650.0000 mg | ORAL_TABLET | ORAL | Status: DC
  Administered 2015-12-19: 650 mg via ORAL

## 2016-02-02 ENCOUNTER — Telehealth: Payer: Self-pay | Admitting: Internal Medicine

## 2016-02-02 ENCOUNTER — Other Ambulatory Visit: Payer: Self-pay

## 2016-02-02 DIAGNOSIS — K5090 Crohn's disease, unspecified, without complications: Secondary | ICD-10-CM

## 2016-02-02 NOTE — Telephone Encounter (Signed)
Pt should be due for next Remicade 02/13/16. Left message for [facility] to call back to schedule appt.  Pt scheduled for 02/13/16@10am . Pt aware of appt.

## 2016-02-13 ENCOUNTER — Ambulatory Visit (HOSPITAL_COMMUNITY)
Admission: RE | Admit: 2016-02-13 | Discharge: 2016-02-13 | Disposition: A | Discharge status: Home / Self Care | Payer: BLUE CROSS/BLUE SHIELD | Source: Ambulatory Visit | Attending: Internal Medicine | Admitting: Internal Medicine

## 2016-02-13 VITALS — BP 129/75 | HR 69 | Temp 97.5°F | Resp 16 | Ht 66.5 in | Wt 190.0 lb

## 2016-02-13 DIAGNOSIS — K5090 Crohn's disease, unspecified, without complications: Secondary | ICD-10-CM

## 2016-02-13 DIAGNOSIS — Z79899 Other long term (current) drug therapy: Secondary | ICD-10-CM | POA: Insufficient documentation

## 2016-02-13 MED ORDER — ACETAMINOPHEN 325 MG PO TABS
ORAL_TABLET | ORAL | Status: AC
  Filled 2016-02-13: qty 2

## 2016-02-13 MED ORDER — DIPHENHYDRAMINE HCL 25 MG PO CAPS
ORAL_CAPSULE | ORAL | Status: AC
  Filled 2016-02-13: qty 2

## 2016-02-13 MED ORDER — ACETAMINOPHEN 325 MG PO TABS
650.0000 mg | ORAL_TABLET | ORAL | Status: DC
  Administered 2016-02-13: 650 mg via ORAL

## 2016-02-13 MED ORDER — DIPHENHYDRAMINE HCL 25 MG PO CAPS
50.0000 mg | ORAL_CAPSULE | ORAL | Status: DC
  Administered 2016-02-13: 50 mg via ORAL

## 2016-02-13 MED ORDER — SODIUM CHLORIDE 0.9 % IV SOLN
5.0000 mg/kg | INTRAVENOUS | Status: DC
  Administered 2016-02-13: 400 mg via INTRAVENOUS
  Filled 2016-02-13: qty 40

## 2016-04-08 ENCOUNTER — Other Ambulatory Visit: Payer: Self-pay

## 2016-04-08 DIAGNOSIS — K5090 Crohn's disease, unspecified, without complications: Secondary | ICD-10-CM

## 2016-04-09 ENCOUNTER — Encounter (HOSPITAL_COMMUNITY)
Admission: RE | Admit: 2016-04-09 | Discharge: 2016-04-09 | Disposition: A | Discharge status: Home / Self Care | Payer: BLUE CROSS/BLUE SHIELD | Source: Ambulatory Visit | Attending: Internal Medicine | Admitting: Internal Medicine

## 2016-04-09 VITALS — BP 140/94 | HR 69 | Temp 98.5°F | Resp 20 | Ht 66.5 in | Wt 189.0 lb

## 2016-04-09 DIAGNOSIS — K5090 Crohn's disease, unspecified, without complications: Secondary | ICD-10-CM | POA: Diagnosis not present

## 2016-04-09 MED ORDER — ACETAMINOPHEN 325 MG PO TABS
650.0000 mg | ORAL_TABLET | Freq: Once | ORAL | Status: AC
Start: 2016-04-09 — End: 2016-04-09
  Administered 2016-04-09: 650 mg via ORAL

## 2016-04-09 MED ORDER — ACETAMINOPHEN 325 MG PO TABS
ORAL_TABLET | ORAL | Status: AC
  Filled 2016-04-09: qty 2

## 2016-04-09 MED ORDER — SODIUM CHLORIDE 0.9 % IV SOLN: INTRAVENOUS | Status: DC

## 2016-04-09 MED ORDER — INFLIXIMAB 100 MG IV SOLR
5.0000 mg/kg | Freq: Once | INTRAVENOUS | Status: DC
  Filled 2016-04-09: qty 40

## 2016-04-09 MED ORDER — DIPHENHYDRAMINE HCL 25 MG PO CAPS: 50.0000 mg | ORAL_CAPSULE | Freq: Once | ORAL | Status: DC

## 2016-04-09 MED ORDER — SODIUM CHLORIDE 0.9 % IV SOLN
400.0000 mg | Freq: Once | INTRAVENOUS | Status: AC
  Administered 2016-04-09: 400 mg via INTRAVENOUS
  Filled 2016-04-09: qty 40

## 2016-06-03 ENCOUNTER — Other Ambulatory Visit (HOSPITAL_COMMUNITY): Payer: Self-pay | Admitting: *Deleted

## 2016-06-03 ENCOUNTER — Other Ambulatory Visit: Payer: Self-pay

## 2016-06-03 DIAGNOSIS — K50919 Crohn's disease, unspecified, with unspecified complications: Secondary | ICD-10-CM

## 2016-06-04 ENCOUNTER — Encounter (HOSPITAL_COMMUNITY)
Admission: RE | Admit: 2016-06-04 | Discharge: 2016-06-04 | Disposition: A | Discharge status: Home / Self Care | Payer: BLUE CROSS/BLUE SHIELD | Source: Ambulatory Visit | Attending: Internal Medicine | Admitting: Internal Medicine

## 2016-06-04 VITALS — BP 129/78 | HR 76 | Temp 98.0°F | Resp 20 | Ht 66.5 in | Wt 190.0 lb

## 2016-06-04 DIAGNOSIS — K5090 Crohn's disease, unspecified, without complications: Secondary | ICD-10-CM | POA: Diagnosis not present

## 2016-06-04 DIAGNOSIS — K50919 Crohn's disease, unspecified, with unspecified complications: Secondary | ICD-10-CM

## 2016-06-04 MED ORDER — SODIUM CHLORIDE 0.9 % IV SOLN
Freq: Once | INTRAVENOUS | Status: AC
  Administered 2016-06-04: 10:00:00 via INTRAVENOUS

## 2016-06-04 MED ORDER — ACETAMINOPHEN 325 MG PO TABS
650.0000 mg | ORAL_TABLET | Freq: Once | ORAL | Status: AC
Start: 2016-06-04 — End: 2016-06-04
  Administered 2016-06-04: 650 mg via ORAL

## 2016-06-04 MED ORDER — SODIUM CHLORIDE 0.9 % IV SOLN
5.0000 mg/kg | Freq: Once | INTRAVENOUS | Status: AC
  Administered 2016-06-04: 400 mg via INTRAVENOUS
  Filled 2016-06-04: qty 40

## 2016-06-04 MED ORDER — DIPHENHYDRAMINE HCL 25 MG PO TABS
50.0000 mg | ORAL_TABLET | Freq: Once | ORAL | Status: DC
  Filled 2016-06-04: qty 2

## 2016-06-04 MED ORDER — ACETAMINOPHEN 325 MG PO TABS
ORAL_TABLET | ORAL | Status: AC
  Filled 2016-06-04: qty 2

## 2016-06-17 ENCOUNTER — Ambulatory Visit (INDEPENDENT_AMBULATORY_CARE_PROVIDER_SITE_OTHER): Payer: BLUE CROSS/BLUE SHIELD | Admitting: Internal Medicine

## 2016-06-17 ENCOUNTER — Encounter: Payer: Self-pay | Admitting: Internal Medicine

## 2016-06-17 ENCOUNTER — Other Ambulatory Visit (INDEPENDENT_AMBULATORY_CARE_PROVIDER_SITE_OTHER): Payer: BLUE CROSS/BLUE SHIELD

## 2016-06-17 VITALS — BP 140/100 | HR 92 | Temp 97.7°F | Resp 14 | Ht 66.5 in | Wt 187.1 lb

## 2016-06-17 DIAGNOSIS — I4891 Unspecified atrial fibrillation: Secondary | ICD-10-CM | POA: Diagnosis not present

## 2016-06-17 DIAGNOSIS — Z23 Encounter for immunization: Secondary | ICD-10-CM

## 2016-06-17 DIAGNOSIS — Z0000 Encounter for general adult medical examination without abnormal findings: Secondary | ICD-10-CM | POA: Diagnosis not present

## 2016-06-17 DIAGNOSIS — K50113 Crohn's disease of large intestine with fistula: Secondary | ICD-10-CM

## 2016-06-17 LAB — COMPREHENSIVE METABOLIC PANEL
ALT: 19 U/L (ref 0–53)
AST: 19 U/L (ref 0–37)
Albumin: 4.1 g/dL (ref 3.5–5.2)
Alkaline Phosphatase: 71 U/L (ref 39–117)
BUN: 12 mg/dL (ref 6–23)
CO2: 30 mEq/L (ref 19–32)
Calcium: 9.6 mg/dL (ref 8.4–10.5)
Chloride: 103 mEq/L (ref 96–112)
Creatinine, Ser: 1.01 mg/dL (ref 0.40–1.50)
GFR: 79.82 mL/min (ref >60.00)
Glucose, Bld: 92 mg/dL (ref 70–99)
Potassium: 4 mEq/L (ref 3.5–5.1)
Sodium: 140 mEq/L (ref 135–145)
Total Bilirubin: 1.5 mg/dL — ABNORMAL HIGH (ref 0.2–1.2)
Total Protein: 7.3 g/dL (ref 6.0–8.3)

## 2016-06-17 LAB — LIPID PANEL
Cholesterol: 217 mg/dL — ABNORMAL HIGH (ref 0–200)
HDL: 73.3 mg/dL (ref >39.00)
LDL Cholesterol: 129 mg/dL — ABNORMAL HIGH (ref 0–99)
NonHDL: 144.06
Total CHOL/HDL Ratio: 3
Triglycerides: 74 mg/dL (ref 0.0–149.0)
VLDL: 14.8 mg/dL (ref 0.0–40.0)

## 2016-06-17 LAB — CBC
HCT: 44.8 % (ref 39.0–52.0)
Hemoglobin: 15.4 g/dL (ref 13.0–17.0)
MCHC: 34.3 g/dL (ref 30.0–36.0)
MCV: 91.3 fl (ref 78.0–100.0)
Platelets: 240 10*3/uL (ref 150.0–400.0)
RBC: 4.91 Mil/uL (ref 4.22–5.81)
RDW: 13 % (ref 11.5–15.5)
WBC: 9.1 10*3/uL (ref 4.0–10.5)

## 2016-06-17 NOTE — Progress Notes (Signed)
Pre visit review using our clinic review tool, if applicable. No additional management support is needed unless otherwise documented below in the visit note. 

## 2016-06-17 NOTE — Assessment & Plan Note (Signed)
Not a candidate for shingles at this time due to remicaid treatment. Given tdap at today's visit. Colonoscopy up to date. Counseled on the need for exercise to help with overall health. BP at goal at home. Given screening recommendations.

## 2016-06-17 NOTE — Assessment & Plan Note (Signed)
Still doing remicaid and seeing GI and they manage that.

## 2016-06-17 NOTE — Progress Notes (Signed)
   Subjective:    Patient ID: Hunter Edwards, male    DOB: [birth year], 60 y.o.   MRN: [id number]  HPI The patient is a 60 YO man coming in for wellness. BP is high lately and not taking all his usual medicines, now taking his losartan EOD per his cardiologist.   PMH, [facility], social history reviewed and updated.   Review of Systems  Constitutional: Negative for activity change, appetite change, fatigue, fever and unexpected weight change.  HENT: Negative.   Eyes: Negative.   Respiratory: Negative for cough, chest tightness, shortness of breath and wheezing.   Cardiovascular: Negative for chest pain, palpitations and leg swelling.  Gastrointestinal: Negative for abdominal distention, abdominal pain, constipation, diarrhea and nausea.  Musculoskeletal: Positive for arthralgias. Negative for back pain, gait problem and myalgias.  Skin: Negative.   Neurological: Negative.   Psychiatric/Behavioral: Negative.       Objective:   Physical Exam  Constitutional: He is oriented to person, place, and time. He appears well-developed and well-nourished.  Overweight  HENT:  Head: Normocephalic and atraumatic.  Eyes: EOM are normal.  Neck: Normal range of motion.  Cardiovascular: Normal rate and regular rhythm.   Pulmonary/Chest: Effort normal. No respiratory distress. He has no wheezes. He has no rales.  Abdominal: Soft. Bowel sounds are normal. He exhibits no distension. There is no tenderness.  Musculoskeletal: He exhibits no edema.  Neurological: He is alert and oriented to person, place, and time. Coordination normal.  Skin: Skin is warm and dry.  Psychiatric: He has a normal mood and affect.   Vitals:   06/17/16 1303  BP: (!) 160/102  Pulse: 92  Resp: 14  Temp: 97.7 F (36.5 C)  TempSrc: Oral  SpO2: 97%  Weight: 187 lb 1.9 oz (84.9 kg)  Height: 5' 6.5" (1.689 m)      Assessment & Plan:  Tdap given at visit.

## 2016-06-17 NOTE — Assessment & Plan Note (Signed)
S/P ablation this year and cardiology tapering down on his medications. In sinus today.

## 2016-06-17 NOTE — Patient Instructions (Signed)
We will check the labs today and have given you the tetanus shot.   Health Maintenance, Male A healthy lifestyle and preventative care can promote health and wellness.  Maintain regular health, dental, and eye exams.  Eat a healthy diet. Foods like vegetables, fruits, whole grains, low-fat dairy products, and lean protein foods contain the nutrients you need and are low in calories. Decrease your intake of foods high in solid fats, added sugars, and salt. Get information about a proper diet from your health care provider, if necessary.  Regular physical exercise is one of the most important things you can do for your health. Most adults should get at least 150 minutes of moderate-intensity exercise (any activity that increases your heart rate and causes you to sweat) each week. In addition, most adults need muscle-strengthening exercises on 2 or more days a week.   Maintain a healthy weight. The body mass index (BMI) is a screening tool to identify possible weight problems. It provides an estimate of body fat based on height and weight. Your health care provider can find your BMI and can help you achieve or maintain a healthy weight. For males 20 years and older:  A BMI below 18.5 is considered underweight.  A BMI of 18.5 to 24.9 is normal.  A BMI of 25 to 29.9 is considered overweight.  A BMI of 30 and above is considered obese.  Maintain normal blood lipids and cholesterol by exercising and minimizing your intake of saturated fat. Eat a balanced diet with plenty of fruits and vegetables. Blood tests for lipids and cholesterol should begin at age 61 and be repeated every 5 years. If your lipid or cholesterol levels are high, you are over age 53, or you are at high risk for heart disease, you may need your cholesterol levels checked more frequently.Ongoing high lipid and cholesterol levels should be treated with medicines if diet and exercise are not working.  If you smoke, find out from  your health care provider how to quit. If you do not use tobacco, do not start.  Lung cancer screening is recommended for adults aged 6-80 years who are at high risk for developing lung cancer because of a history of smoking. A yearly low-dose CT scan of the lungs is recommended for people who have at least a 30-pack-year history of smoking and are current smokers or have quit within the past 15 years. A pack year of smoking is smoking an average of 1 pack of cigarettes a day for 1 year (for example, a 30-pack-year history of smoking could mean smoking 1 pack a day for 30 years or 2 packs a day for 15 years). Yearly screening should continue until the smoker has stopped smoking for at least 15 years. Yearly screening should be stopped for people who develop a health problem that would prevent them from having lung cancer treatment.  If you choose to drink alcohol, do not have more than 2 drinks per day. One drink is considered to be 12 oz (360 mL) of beer, 5 oz (150 mL) of wine, or 1.5 oz (45 mL) of liquor.  Avoid the use of street drugs. Do not share needles with anyone. Ask for help if you need support or instructions about stopping the use of drugs.  High blood pressure causes heart disease and increases the risk of stroke. High blood pressure is more likely to develop in:  People who have blood pressure in the end of the normal range (100-139/85-89 mm  Hg).  People who are overweight or obese.  People who are African American.  If you are 39-51 years of age, have your blood pressure checked every 3-5 years. If you are 49 years of age or older, have your blood pressure checked every year. You should have your blood pressure measured twice--once when you are at a hospital or clinic, and once when you are not at a hospital or clinic. Record the average of the two measurements. To check your blood pressure when you are not at a hospital or clinic, you can use:  An automated blood pressure machine  at a pharmacy.  A home blood pressure monitor.  If you are 65-35 years old, ask your health care provider if you should take aspirin to prevent heart disease.  Diabetes screening involves taking a blood sample to check your fasting blood sugar level. This should be done once every 3 years after age 55 if you are at a normal weight and without risk factors for diabetes. Testing should be considered at a younger age or be carried out more frequently if you are overweight and have at least 1 risk factor for diabetes.  Colorectal cancer can be detected and often prevented. Most routine colorectal cancer screening begins at the age of 8 and continues through age 52. However, your health care provider may recommend screening at an earlier age if you have risk factors for colon cancer. On a yearly basis, your health care provider may provide home test kits to check for hidden blood in the stool. A small camera at the end of a tube may be used to directly examine the colon (sigmoidoscopy or colonoscopy) to detect the earliest forms of colorectal cancer. Talk to your health care provider about this at age 64 when routine screening begins. A direct exam of the colon should be repeated every 5-10 years through age 64, unless early forms of precancerous polyps or small growths are found.  People who are at an increased risk for hepatitis B should be screened for this virus. You are considered at high risk for hepatitis B if:  You were born in a country where hepatitis B occurs often. Talk with your health care provider about which countries are considered high risk.  Your parents were born in a high-risk country and you have not received a shot to protect against hepatitis B (hepatitis B vaccine).  You have HIV or AIDS.  You use needles to inject street drugs.  You live with, or have sex with, someone who has hepatitis B.  You are a man who has sex with other men (MSM).  You get hemodialysis  treatment.  You take certain medicines for conditions like cancer, organ transplantation, and autoimmune conditions.  Hepatitis C blood testing is recommended for all people born from [birth year] through 1966 and any individual with known risk factors for hepatitis C.  Healthy men should no longer receive prostate-specific antigen (PSA) blood tests as part of routine cancer screening. Talk to your health care provider about prostate cancer screening.  Testicular cancer screening is not recommended for adolescents or adult males who have no symptoms. Screening includes self-exam, a health care provider exam, and other screening tests. Consult with your health care provider about any symptoms you have or any concerns you have about testicular cancer.  Practice safe sex. Use condoms and avoid high-risk sexual practices to reduce the spread of sexually transmitted infections (STIs).  You should be screened for STIs, including gonorrhea and  chlamydia if:  You are sexually active and are younger than 24 years.  You are older than 24 years, and your health care provider tells you that you are at risk for this type of infection.  Your sexual activity has changed since you were last screened, and you are at an increased risk for chlamydia or gonorrhea. Ask your health care provider if you are at risk.  If you are at risk of being infected with HIV, it is recommended that you take a prescription medicine daily to prevent HIV infection. This is called pre-exposure prophylaxis (PrEP). You are considered at risk if:  You are a man who has sex with other men (MSM).  You are a heterosexual man who is sexually active with multiple partners.  You take drugs by injection.  You are sexually active with a partner who has HIV.  Talk with your health care provider about whether you are at high risk of being infected with HIV. If you choose to begin PrEP, you should first be tested for HIV. You should then be tested  every 3 months for as long as you are taking PrEP.  Use sunscreen. Apply sunscreen liberally and repeatedly throughout the day. You should seek shade when your shadow is shorter than you. Protect yourself by wearing long sleeves, pants, a wide-brimmed hat, and sunglasses year round whenever you are outdoors.  Tell your health care provider of new moles or changes in moles, especially if there is a change in shape or color. Also, tell your health care provider if a mole is larger than the size of a pencil eraser.  A one-time screening for abdominal aortic aneurysm (AAA) and surgical repair of large AAAs by ultrasound is recommended for men aged 14-75 years who are current or former smokers.  Stay current with your vaccines (immunizations).   This information is not intended to replace advice given to you by your health care provider. Make sure you discuss any questions you have with your health care provider.   Document Released: 04/29/2008 Document Revised: 11/22/2014 Document Reviewed: 03/29/2011 Elsevier Interactive Patient Education [employer].

## 2016-11-04 DIAGNOSIS — Z8249 Family history of ischemic heart disease and other diseases of the circulatory system: Secondary | ICD-10-CM | POA: Diagnosis not present

## 2016-11-04 DIAGNOSIS — K219 Gastro-esophageal reflux disease without esophagitis: Secondary | ICD-10-CM | POA: Diagnosis not present

## 2016-11-04 DIAGNOSIS — R0789 Other chest pain: Secondary | ICD-10-CM | POA: Diagnosis not present

## 2016-11-04 DIAGNOSIS — Z79899 Other long term (current) drug therapy: Secondary | ICD-10-CM | POA: Diagnosis not present

## 2016-11-04 DIAGNOSIS — I209 Angina pectoris, unspecified: Secondary | ICD-10-CM | POA: Diagnosis not present

## 2016-11-04 DIAGNOSIS — H1033 Unspecified acute conjunctivitis, bilateral: Secondary | ICD-10-CM | POA: Diagnosis not present

## 2016-11-04 DIAGNOSIS — K5000 Crohn's disease of small intestine without complications: Secondary | ICD-10-CM | POA: Diagnosis not present

## 2016-11-04 DIAGNOSIS — Z7982 Long term (current) use of aspirin: Secondary | ICD-10-CM | POA: Diagnosis not present

## 2016-11-04 DIAGNOSIS — J9811 Atelectasis: Secondary | ICD-10-CM | POA: Diagnosis not present

## 2016-11-04 DIAGNOSIS — I10 Essential (primary) hypertension: Secondary | ICD-10-CM | POA: Diagnosis not present

## 2016-11-04 DIAGNOSIS — R079 Chest pain, unspecified: Secondary | ICD-10-CM | POA: Diagnosis not present

## 2016-11-04 DIAGNOSIS — I4891 Unspecified atrial fibrillation: Secondary | ICD-10-CM | POA: Diagnosis not present

## 2016-11-04 DIAGNOSIS — K5090 Crohn's disease, unspecified, without complications: Secondary | ICD-10-CM | POA: Diagnosis not present

## 2016-11-04 LAB — HEPATIC FUNCTION PANEL
ALT: 17 U/L (ref 10–40)
AST: 19 U/L (ref 14–40)
Alkaline Phosphatase: 79 U/L (ref 25–125)
Bilirubin, Total: 0.9 mg/dL

## 2016-11-04 LAB — BASIC METABOLIC PANEL
BUN: 10 mg/dL (ref 4–21)
Creatinine: 1 mg/dL (ref 0.6–1.3)
Potassium: 4.7 mmol/L (ref 3.4–5.3)
Sodium: 141 mmol/L (ref 137–147)

## 2016-11-04 LAB — CBC AND DIFFERENTIAL
HCT: 40 % — AB (ref 41–53)
Hemoglobin: 14.4 g/dL (ref 13.5–17.5)
Platelets: 232 10*3/uL (ref 150–399)
WBC: 9.6 10^3/mL

## 2016-11-04 LAB — POCT INR: INR: 1 (ref 0.9–1.1)

## 2016-11-05 DIAGNOSIS — R079 Chest pain, unspecified: Secondary | ICD-10-CM | POA: Diagnosis not present

## 2016-11-05 DIAGNOSIS — Z6831 Body mass index (BMI) 31.0-31.9, adult: Secondary | ICD-10-CM | POA: Diagnosis not present

## 2016-11-09 ENCOUNTER — Encounter: Payer: Self-pay | Admitting: Internal Medicine

## 2016-11-09 NOTE — Progress Notes (Signed)
Results abstracted and entered

## 2016-11-10 ENCOUNTER — Inpatient Hospital Stay: Payer: BLUE CROSS/BLUE SHIELD | Admitting: Internal Medicine

## 2016-11-19 ENCOUNTER — Ambulatory Visit (INDEPENDENT_AMBULATORY_CARE_PROVIDER_SITE_OTHER): Payer: BLUE CROSS/BLUE SHIELD | Admitting: Internal Medicine

## 2016-11-19 ENCOUNTER — Encounter: Payer: Self-pay | Admitting: Internal Medicine

## 2016-11-19 DIAGNOSIS — K219 Gastro-esophageal reflux disease without esophagitis: Secondary | ICD-10-CM

## 2016-11-19 DIAGNOSIS — K50113 Crohn's disease of large intestine with fistula: Secondary | ICD-10-CM | POA: Diagnosis not present

## 2016-11-19 DIAGNOSIS — R0789 Other chest pain: Secondary | ICD-10-CM | POA: Diagnosis not present

## 2016-11-19 NOTE — Progress Notes (Signed)
Pre visit review using our clinic review tool, if applicable. No additional management support is needed unless otherwise documented below in the visit note. 

## 2016-11-19 NOTE — Assessment & Plan Note (Signed)
Ruled out for MI and stress test negative for ischemia. Will follow with cardiology for his A fib. Meds adjusted for his GERD which was determined to be likely cause. Doing well now and no symptoms.

## 2016-11-19 NOTE — Assessment & Plan Note (Signed)
Has not seen GI in more than 1 year and advised to schedule visit with them to check in. Off remicaid for 7 months now and doing well.

## 2016-11-19 NOTE — Patient Instructions (Signed)
We don't need to do any blood work today.

## 2016-11-19 NOTE — Assessment & Plan Note (Signed)
Taking protonix now instead of zantac and symptoms are well controlled.

## 2016-11-19 NOTE — Progress Notes (Signed)
   Subjective:    Patient ID: Hunter Edwards, male    DOB: August 15, 1956, 61 y.o.   MRN: [id number]  HPI The patient is a 61 YO man coming in for hospital follow up (in for chest pains, given stress test and cardiac testing which was negative for acute ischemia, thought to be related to GERD and zantac changed to PPI). He is doing well since home from the hospital. No chest pains, SOB, abdominal pain. Still off remicaid since 05/2016 and doing well with his stomach.   PMH, [facility], social history reviewed and update.   Review of Systems  Constitutional: Negative.   HENT: Negative.   Eyes: Negative.   Respiratory: Negative for cough, chest tightness and shortness of breath.   Cardiovascular: Negative for chest pain, palpitations and leg swelling.  Gastrointestinal: Negative for abdominal distention, abdominal pain, constipation, diarrhea, nausea and vomiting.  Musculoskeletal: Negative.   Skin: Negative.   Neurological: Negative.   Psychiatric/Behavioral: Negative.       Objective:   Physical Exam  Constitutional: He is oriented to person, place, and time. He appears well-developed and well-nourished.  HENT:  Head: Normocephalic and atraumatic.  Eyes: EOM are normal.  Neck: Normal range of motion.  Cardiovascular: Normal rate and regular rhythm.   Pulmonary/Chest: Effort normal and breath sounds normal. No respiratory distress. He has no wheezes. He has no rales.  Abdominal: Soft. Bowel sounds are normal. He exhibits no distension. There is no tenderness. There is no rebound.  Musculoskeletal: He exhibits no edema.  Neurological: He is alert and oriented to person, place, and time. Coordination normal.  Skin: Skin is warm and dry.  Psychiatric: He has a normal mood and affect.   Vitals:   11/19/16 1352  BP: 132/72  Pulse: 80  Resp: 16  Temp: 97.6 F (36.4 C)  TempSrc: Oral  SpO2: 99%  Weight: 197 lb 4 oz (89.5 kg)  Height: 5' 6.5" (1.689 m)      Assessment & Plan:

## 2016-11-30 ENCOUNTER — Other Ambulatory Visit: Payer: Self-pay | Admitting: *Deleted

## 2016-11-30 MED ORDER — PANTOPRAZOLE SODIUM 40 MG PO TBEC: 40.0000 mg | DELAYED_RELEASE_TABLET | Freq: Every day | ORAL | 1 refills | Status: DC

## 2016-11-30 NOTE — Telephone Encounter (Signed)
Pt left msg on triage yesterday @ 12:43 pm requesting a call. Call pt bck pt states he saw MD on 1/5, was given rx for pantoprazole while he was in hosp wanting to get a refill. Verified pharmacy inform pt sending to [facility] as we speak...Johny Chess

## 2016-12-07 ENCOUNTER — Telehealth: Payer: Self-pay | Admitting: *Deleted

## 2016-12-07 NOTE — Telephone Encounter (Signed)
Rec'd call pt states pharmacy told him he need prior authorization for the Pantoprazole that was sent to [facility].../lmb

## 2016-12-08 ENCOUNTER — Telehealth: Payer: Self-pay

## 2016-12-08 NOTE — Telephone Encounter (Signed)
PA started

## 2016-12-08 NOTE — Telephone Encounter (Signed)
PA for Pantoprazole sod DR 40mg  started KEY: [id number]

## 2016-12-09 ENCOUNTER — Telehealth: Payer: Self-pay

## 2016-12-09 NOTE — Telephone Encounter (Signed)
PA approved for Pantoprazole sodium 40mg , pharmacy called

## 2017-02-12 IMAGING — MR MR PELVIS WO/W CM
4 of 8 series · 19 of 48 positions shown · IV contrast (multihance)
Comparison: None.

CLINICAL DATA: Evaluate for perirectal abscess

EXAM:
MRI PELVIS WITHOUT AND WITH CONTRAST
TECHNIQUE: Multiplanar multisequence MR imaging of the pelvis was performed
both before and after administration of intravenous contrast.
CONTRAST:  18 cc of MultiHance

[Series 4: T2 · sagittal · 2.0mm · 0.51mm/px · 8 of 75 slices shown (1 of 2)]
[im 1/75]
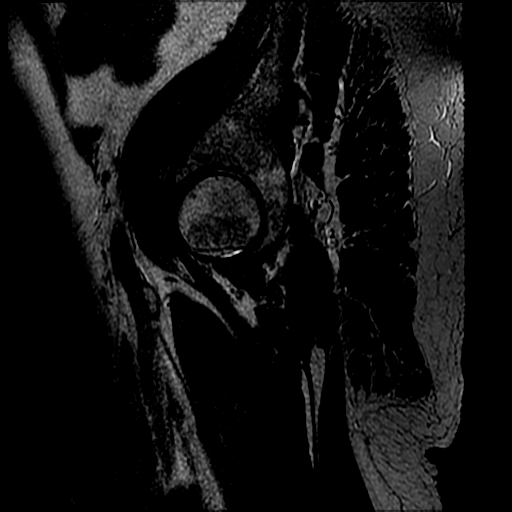
[im 9/75]
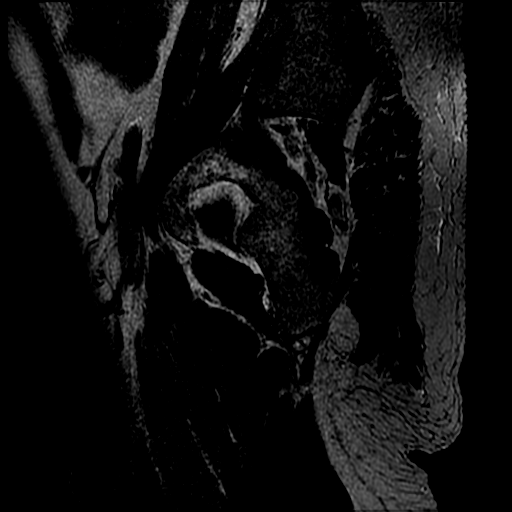
[im 25/75]
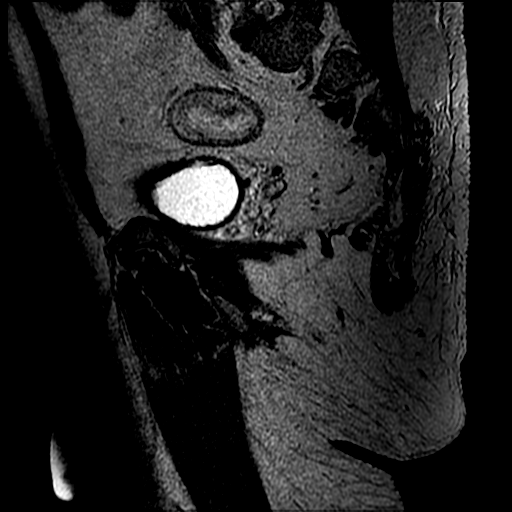
[im 33/75]
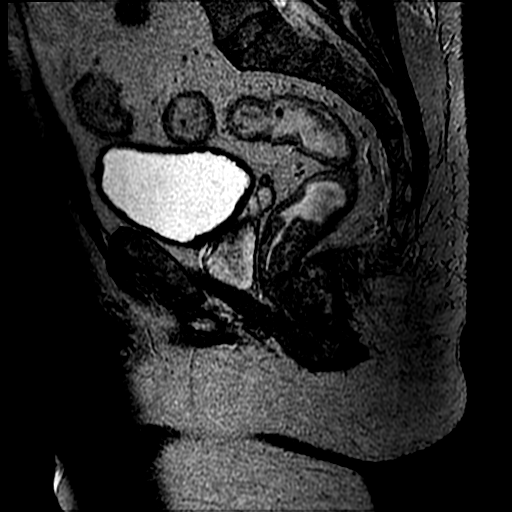
[im 42/75]
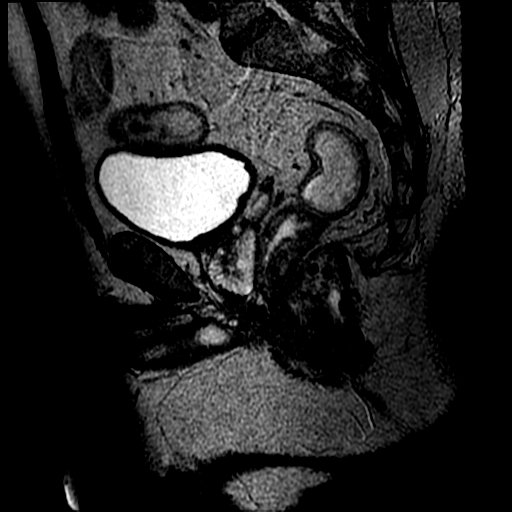
[im 50/75]
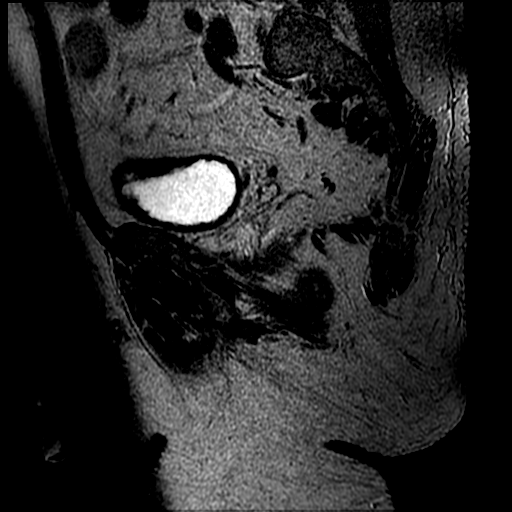
[im 66/75]
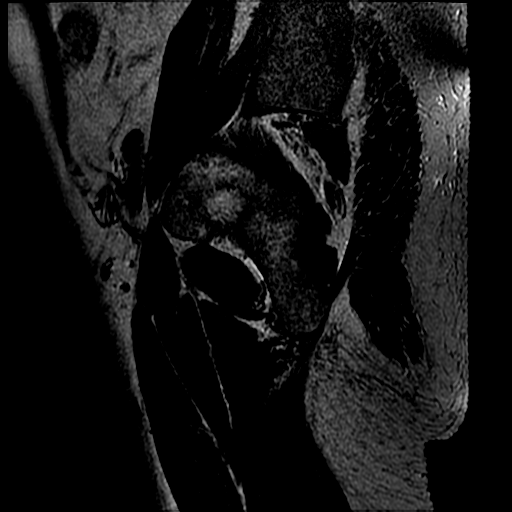
[im 75/75]
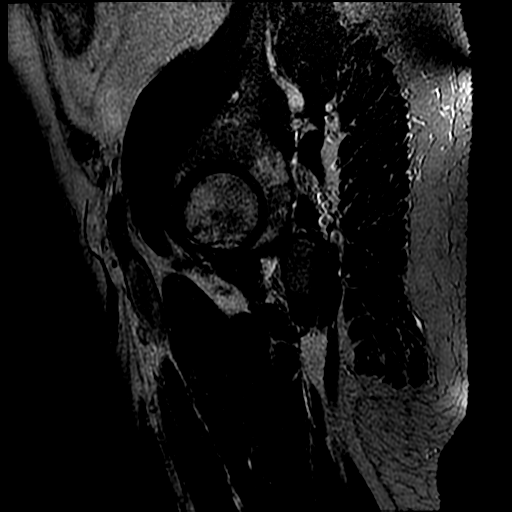

[Series 6: T1 · axial · 4.0mm · 0.43mm/px · z∈[-183,+13]mm · 5 of 43 slices shown]
[im 1/43]
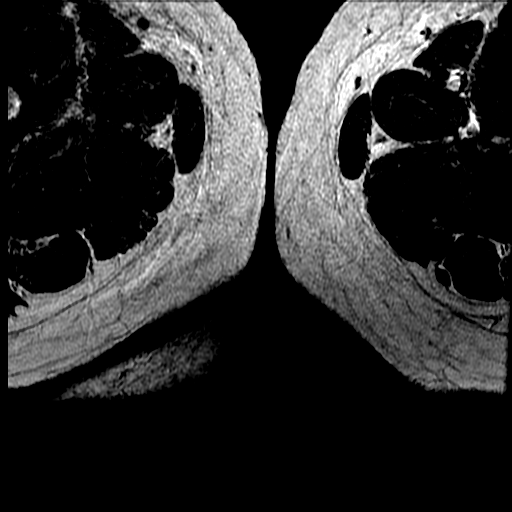
[im 9/43]
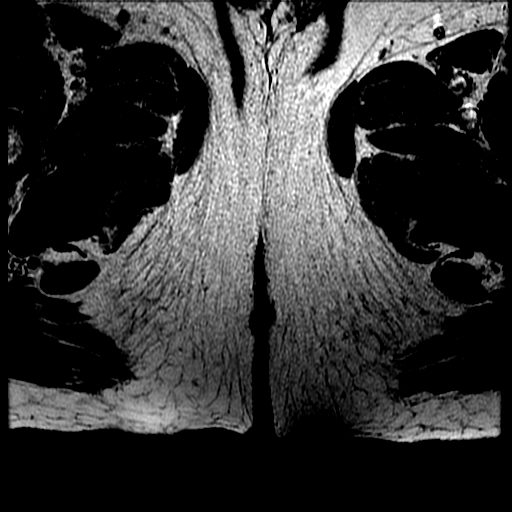
[im 17/43]
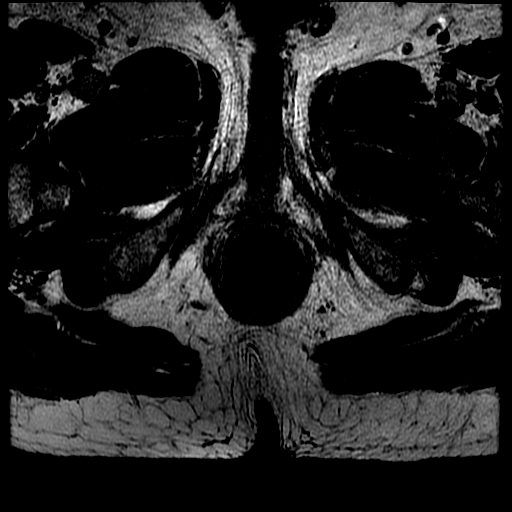
[im 26/43]
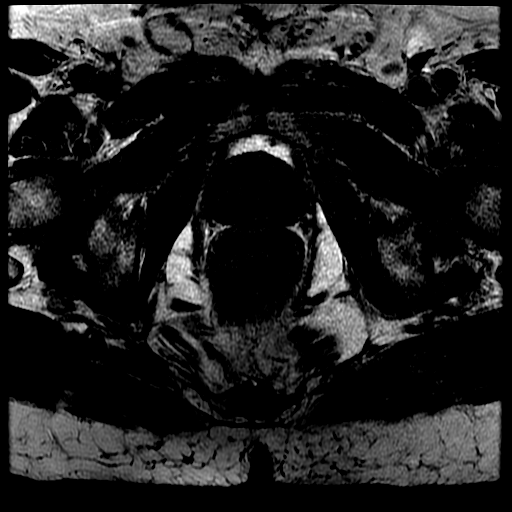
[im 43/43]
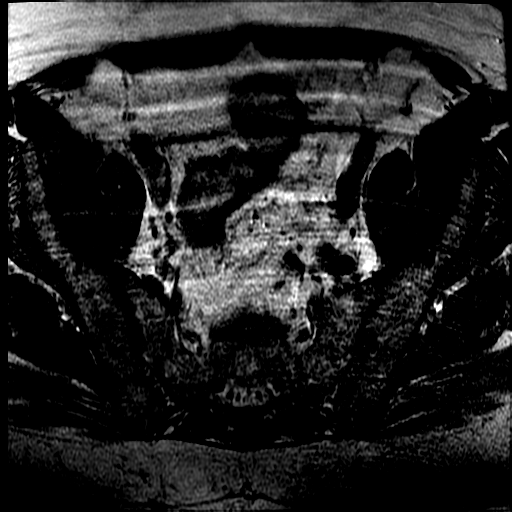

[Series 7: T2 fat-sat · sagittal · 2.0mm · 0.51mm/px · 3 of 75 slices shown]
[im 10/75]
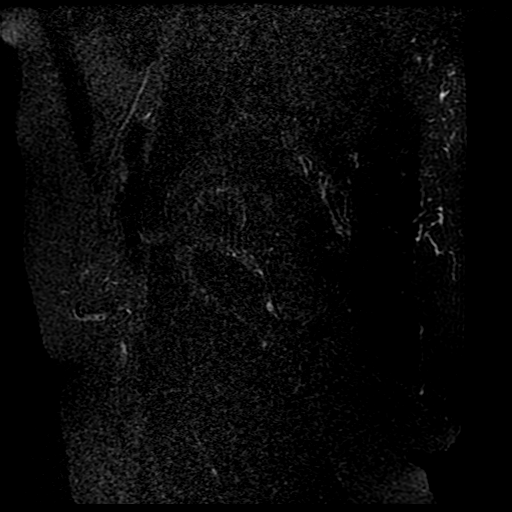
[im 38/75]
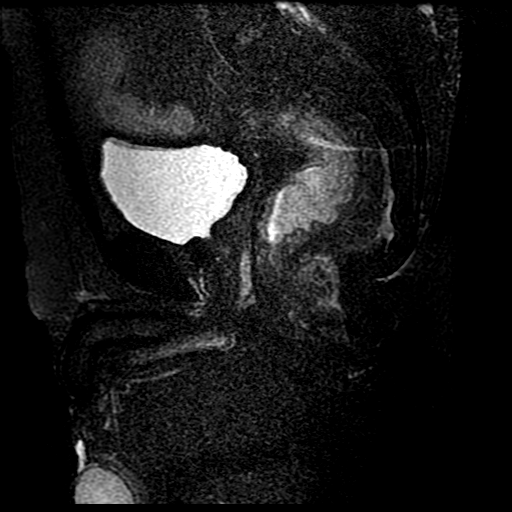
[im 65/75]
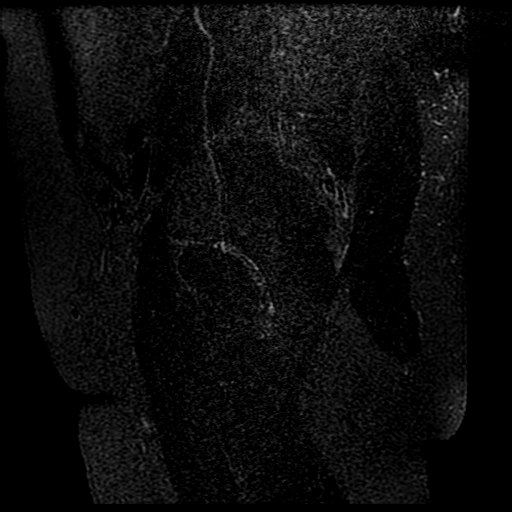

[Series 8: T2 · axial · 4.0mm · 0.43mm/px · z∈[-183,+13]mm · 3 of 43 slices shown (2 of 2)]
[im 1/43]
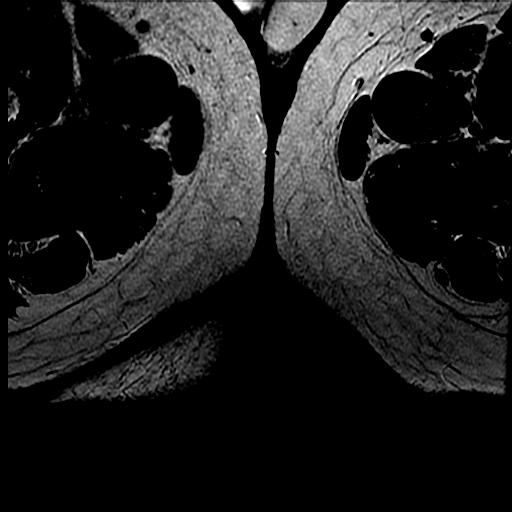
[im 22/43]
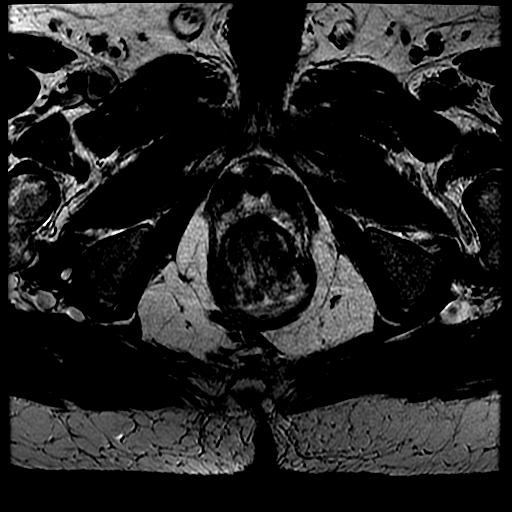
[im 43/43]
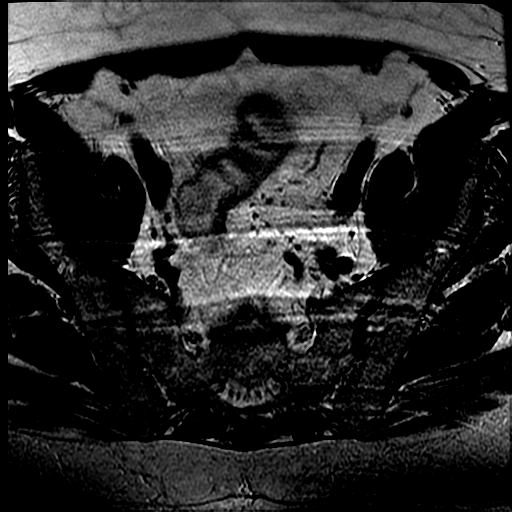

[19 of 48 positions shown; findings below may reference images not displayed]

FINDINGS: Urinary Tract: The urinary bladder appears normal.

Stomach/Bowel: The visualized portions of the sigmoid colon and
proximal rectum appear normal. Posterior to the distal rectum there
is a perirectal debris and fluid collection which measures
approximately 11 mm AP, 18 mm transverse, and 22 mm cranial caudal.

Vascular/Lymphatic: The iliac vessels appear within normal limits.
No adenopathy.

Reproductive: Prostate gland and seminal vesicles are unremarkable.

Other: No free fluid identified within the pelvis.

Musculoskeletal: Normal signal from within the bone marrow.
IMPRESSION: Perirectal abscess is identified posterior to the distal rectum just
above the anal verge. This has a maximum diameter of 22 mm.

## 2017-02-23 DIAGNOSIS — I483 Typical atrial flutter: Secondary | ICD-10-CM | POA: Diagnosis not present

## 2017-02-23 DIAGNOSIS — I4891 Unspecified atrial fibrillation: Secondary | ICD-10-CM | POA: Diagnosis not present

## 2017-02-23 DIAGNOSIS — I10 Essential (primary) hypertension: Secondary | ICD-10-CM | POA: Diagnosis not present

## 2017-02-23 DIAGNOSIS — K5000 Crohn's disease of small intestine without complications: Secondary | ICD-10-CM | POA: Diagnosis not present

## 2017-04-15 DIAGNOSIS — M170 Bilateral primary osteoarthritis of knee: Secondary | ICD-10-CM | POA: Diagnosis not present

## 2017-04-20 DIAGNOSIS — H47012 Ischemic optic neuropathy, left eye: Secondary | ICD-10-CM | POA: Diagnosis not present

## 2017-04-20 DIAGNOSIS — H31091 Other chorioretinal scars, right eye: Secondary | ICD-10-CM | POA: Diagnosis not present

## 2017-04-20 DIAGNOSIS — H2513 Age-related nuclear cataract, bilateral: Secondary | ICD-10-CM | POA: Diagnosis not present

## 2017-04-20 DIAGNOSIS — H11153 Pinguecula, bilateral: Secondary | ICD-10-CM | POA: Diagnosis not present

## 2017-05-06 DIAGNOSIS — M545 Low back pain: Secondary | ICD-10-CM | POA: Diagnosis not present

## 2017-05-13 DIAGNOSIS — M545 Low back pain: Secondary | ICD-10-CM | POA: Diagnosis not present

## 2017-05-13 DIAGNOSIS — M542 Cervicalgia: Secondary | ICD-10-CM | POA: Diagnosis not present

## 2017-05-20 DIAGNOSIS — M4807 Spinal stenosis, lumbosacral region: Secondary | ICD-10-CM | POA: Diagnosis not present

## 2017-05-20 DIAGNOSIS — M542 Cervicalgia: Secondary | ICD-10-CM | POA: Diagnosis not present

## 2017-05-20 DIAGNOSIS — M545 Low back pain: Secondary | ICD-10-CM | POA: Diagnosis not present

## 2017-05-27 DIAGNOSIS — I4891 Unspecified atrial fibrillation: Secondary | ICD-10-CM | POA: Diagnosis not present

## 2017-06-13 ENCOUNTER — Other Ambulatory Visit: Payer: Self-pay | Admitting: Internal Medicine

## 2017-06-17 ENCOUNTER — Encounter: Payer: BLUE CROSS/BLUE SHIELD | Admitting: Internal Medicine

## 2017-06-23 ENCOUNTER — Encounter: Payer: Self-pay | Admitting: Internal Medicine

## 2017-06-23 NOTE — Progress Notes (Signed)
Subjective:    Patient ID: Hunter Edwards, male    DOB: [birth year], 61 y.o.   MRN: [id number]  HPI The patient is here for a physical   He denies any changes in his history.  He has no concerns.     Medications and allergies reviewed with patient and updated if appropriate.  Patient Active Problem List   Diagnosis Date Noted  . Atypical chest pain 11/19/2016  . Routine general medical examination at a health care facility 06/16/2015  . Atrial fibrillation ([place]) 05/07/2015  . GERD (gastroesophageal reflux disease) 05/07/2015  . Crohn disease ([place]) 05/07/2015    Current Outpatient Prescriptions on File Prior to Visit  Medication Sig Dispense Refill  . aspirin 81 MG tablet Take 81 mg by mouth daily.    Marland Kitchen losartan (COZAAR) 25 MG tablet Take 25 mg by mouth. Take one by mouth every other day.    . pantoprazole (PROTONIX) 40 MG tablet TAKE 1 TABLET (40 MG TOTAL) BY MOUTH DAILY. 90 tablet 0   No current facility-administered medications on file prior to visit.     Past Medical History:  Diagnosis Date  . Anal fistula   . Anorectal fistula   . Atrial fibrillation ([place]) 2016  . Benign hypertension   . Cataract   . Chest pain   . Colitis   . Contact dermatitis   . Crohn's disease ([place]) 2011  . Disease of jaw   . Dysrhythmia 01/14/15   AFib  . Fatigue   . Folliculitis   . GERD (gastroesophageal reflux disease)   . History of Holter monitoring    Will be done on Friday, 01/17/15  . Hyperlipidemia   . IDA (iron deficiency anemia)   . Need for Tdap vaccination   . Neoplasm of prostate   . Palpitations   . Perirectal abscess   . Rash   . Rectal discharge     Past Surgical History:  Procedure Laterality Date  . ANAL FISTULECTOMY N/A 01/24/2015   Procedure: FISTULECTOMY ANAL;  Surgeon: [name], MD;  Location: [facility];  Service: General;  Laterality: N/A;  . RECTAL EXAM UNDER ANESTHESIA N/A 01/24/2015   Procedure: ANAL  EXAM UNDER ANESTHESIA;   Surgeon: [name], MD;  Location: [place];  Service: General;  Laterality: N/A;  . TONSILLECTOMY  1962    Social History   Social History  . Marital status: Married    Spouse name: N/A  . Number of children: 1  . Years of education: N/A   Occupational History  . Truck Geophysicist/field seismologist    Social History Main Topics  . Smoking status: Former Smoker    Packs/day: 1.00    Types: Cigarettes    Quit date: 11/15/1996  . Smokeless tobacco: Never Used  . Alcohol use 0.0 oz/week     Comment: Rarely  . Drug use: No  . Sexual activity: Not on file   Other Topics Concern  . Not on file   Social History Narrative  . No narrative on file    Family History  Problem Relation Age of Onset  . Stomach cancer Father        died in his 16's  . Diabetes Brother   . Heart disease Brother   . Colon cancer Neg Hx   . Colon polyps Neg Hx   . Kidney disease Neg Hx   . Esophageal cancer Neg Hx   . Gallbladder disease Neg Hx  Review of Systems  Constitutional: Negative for chills and fever.  Eyes: Negative for visual disturbance.  Respiratory: Negative for cough, shortness of breath and wheezing.   Cardiovascular: Negative for chest pain, palpitations and leg swelling.  Gastrointestinal: Positive for anal bleeding. Negative for abdominal pain, blood in stool, constipation, diarrhea and nausea.  Genitourinary: Negative for dysuria and hematuria.  Musculoskeletal: Positive for arthralgias (arthritis - knees) and myalgias (muscle cramping).  Skin: Positive for rash (chronic, - ? allergic).  Neurological: Negative for light-headedness and headaches.  Psychiatric/Behavioral: Negative for dysphoric mood. The patient is not nervous/anxious.        Objective:   Vitals:   06/24/17 1458  BP: 122/84  Pulse: 72  Resp: 16  Temp: 97.7 F (36.5 C)  SpO2: 98%   Wt Readings from Last 3 Encounters:  06/24/17 189 lb (85.7 kg)  11/19/16 197 lb 4 oz (89.5 kg)  06/17/16 187 lb  1.9 oz (84.9 kg)   Body mass index is 30.05 kg/m.   Physical Exam    Constitutional: He appears well-developed and well-nourished. No distress.  HENT:  Head: Normocephalic and atraumatic.  Right Ear: External ear normal.  Left Ear: External ear normal.  Mouth/Throat: Oropharynx is clear and moist.  Normal ear canals and TM b/l  Eyes: Conjunctivae and EOM are normal.  Neck: Neck supple. No tracheal deviation present. No thyromegaly present.  No carotid bruit  Cardiovascular: Normal rate, regular rhythm, normal heart sounds and intact distal pulses.   No murmur heard. Pulmonary/Chest: Effort normal and breath sounds normal. No respiratory distress. He has no wheezes. He has no rales.  Abdominal: Soft. He exhibits no distension. There is no tenderness.  Genitourinary: slightly enlarged prostate w/o nodules Musculoskeletal: He exhibits no edema.  Lymphadenopathy:   He has no cervical adenopathy.  Skin: Skin is warm and dry. He is not diaphoretic.  spotty rash on arms - chornic -  Psychiatric: He has a normal mood and affect. His behavior is normal.       Assessment & Plan:    Physical exam: Screening blood work  ordered Immunizations   Td up to date, discussed shingrix Colonoscopy  Up to date , done 2016 Eye exams  Up to date  EKG  Last done 2016 Exercise mows yard twice a week, very active Weight advised some weight loss - his goal is 170's Skin  - chronic rash, has seen derm  - start antihistamine Substance abuse  none  See Problem List for Assessment and Plan of chronic medical problems.

## 2017-06-24 ENCOUNTER — Other Ambulatory Visit (INDEPENDENT_AMBULATORY_CARE_PROVIDER_SITE_OTHER): Payer: BLUE CROSS/BLUE SHIELD

## 2017-06-24 ENCOUNTER — Ambulatory Visit (INDEPENDENT_AMBULATORY_CARE_PROVIDER_SITE_OTHER): Payer: BLUE CROSS/BLUE SHIELD | Admitting: Internal Medicine

## 2017-06-24 ENCOUNTER — Encounter: Payer: Self-pay | Admitting: Internal Medicine

## 2017-06-24 VITALS — BP 122/84 | HR 72 | Temp 97.7°F | Resp 16 | Ht 66.5 in | Wt 189.0 lb

## 2017-06-24 DIAGNOSIS — I4891 Unspecified atrial fibrillation: Secondary | ICD-10-CM

## 2017-06-24 DIAGNOSIS — Z114 Encounter for screening for human immunodeficiency virus [HIV]: Secondary | ICD-10-CM

## 2017-06-24 DIAGNOSIS — Z0000 Encounter for general adult medical examination without abnormal findings: Secondary | ICD-10-CM

## 2017-06-24 DIAGNOSIS — K219 Gastro-esophageal reflux disease without esophagitis: Secondary | ICD-10-CM

## 2017-06-24 DIAGNOSIS — K50113 Crohn's disease of large intestine with fistula: Secondary | ICD-10-CM | POA: Diagnosis not present

## 2017-06-24 LAB — COMPREHENSIVE METABOLIC PANEL
ALT: 13 U/L (ref 0–53)
AST: 17 U/L (ref 0–37)
Albumin: 4.2 g/dL (ref 3.5–5.2)
Alkaline Phosphatase: 77 U/L (ref 39–117)
BUN: 14 mg/dL (ref 6–23)
CO2: 30 mEq/L (ref 19–32)
Calcium: 9.2 mg/dL (ref 8.4–10.5)
Chloride: 102 mEq/L (ref 96–112)
Creatinine, Ser: 1.15 mg/dL (ref 0.40–1.50)
GFR: 68.48 mL/min (ref >60.00)
Glucose, Bld: 96 mg/dL (ref 70–99)
Potassium: 3.7 mEq/L (ref 3.5–5.1)
Sodium: 137 mEq/L (ref 135–145)
Total Bilirubin: 1.3 mg/dL — ABNORMAL HIGH (ref 0.2–1.2)
Total Protein: 7.1 g/dL (ref 6.0–8.3)

## 2017-06-24 LAB — CBC WITH DIFFERENTIAL/PLATELET
Basophils Absolute: 0.1 10*3/uL (ref 0.0–0.1)
Basophils Relative: 0.6 % (ref 0.0–3.0)
Eosinophils Absolute: 0.1 10*3/uL (ref 0.0–0.7)
Eosinophils Relative: 1.2 % (ref 0.0–5.0)
HCT: 42.2 % (ref 39.0–52.0)
Hemoglobin: 14.1 g/dL (ref 13.0–17.0)
Lymphocytes Relative: 24 % (ref 12.0–46.0)
Lymphs Abs: 2.3 10*3/uL (ref 0.7–4.0)
MCHC: 33.5 g/dL (ref 30.0–36.0)
MCV: 90.4 fl (ref 78.0–100.0)
Monocytes Absolute: 0.8 10*3/uL (ref 0.1–1.0)
Monocytes Relative: 8.6 % (ref 3.0–12.0)
Neutro Abs: 6.2 10*3/uL (ref 1.4–7.7)
Neutrophils Relative %: 65.6 % (ref 43.0–77.0)
Platelets: 245 10*3/uL (ref 150.0–400.0)
RBC: 4.67 Mil/uL (ref 4.22–5.81)
RDW: 13 % (ref 11.5–15.5)
WBC: 9.4 10*3/uL (ref 4.0–10.5)

## 2017-06-24 LAB — LIPID PANEL
Cholesterol: 203 mg/dL — ABNORMAL HIGH (ref 0–200)
HDL: 52.4 mg/dL (ref >39.00)
LDL Cholesterol: 129 mg/dL — ABNORMAL HIGH (ref 0–99)
NonHDL: 150.1
Total CHOL/HDL Ratio: 4
Triglycerides: 106 mg/dL (ref 0.0–149.0)
VLDL: 21.2 mg/dL (ref 0.0–40.0)

## 2017-06-24 LAB — MAGNESIUM: Magnesium: 2.2 mg/dL (ref 1.5–2.5)

## 2017-06-24 LAB — TSH: TSH: 0.89 u[IU]/mL (ref 0.35–4.50)

## 2017-06-24 LAB — HEMOGLOBIN A1C: Hgb A1c MFr Bld: 5.6 % (ref 4.6–6.5)

## 2017-06-24 NOTE — Patient Instructions (Addendum)
Test(s) ordered today. Your results will be released to [facility] (or called to you) after review, usually within 72hours after test completion. If any changes need to be made, you will be notified at that same time.  All other Health Maintenance issues reviewed.   All recommended immunizations and age-appropriate screenings are up-to-date or discussed.  No immunizations administered today.  Consider getting the new shingles vaccine.    Medications reviewed and updated.  No changes recommended at this time.    Please followup in one year for a physical exam    Health Maintenance, Male A healthy lifestyle and preventive care is important for your health and wellness. Ask your health care provider about what schedule of regular examinations is right for you. What should I know about weight and diet? Eat a Healthy Diet  Eat plenty of vegetables, fruits, whole grains, low-fat dairy products, and lean protein.  Do not eat a lot of foods high in solid fats, added sugars, or salt.  Maintain a Healthy Weight Regular exercise can help you achieve or maintain a healthy weight. You should:  Do at least 150 minutes of exercise each week. The exercise should increase your heart rate and make you sweat (moderate-intensity exercise).  Do strength-training exercises at least twice a week.  Watch Your Levels of Cholesterol and Blood Lipids  Have your blood tested for lipids and cholesterol every 5 years starting at 62 years of age. If you are at high risk for heart disease, you should start having your blood tested when you are 62 years old. You may need to have your cholesterol levels checked more often if: ? Your lipid or cholesterol levels are high. ? You are older than 62 years of age. ? You are at high risk for heart disease.  What should I know about cancer screening? Many types of cancers can be detected early and may often be prevented. Lung Cancer  You should be screened every year  for lung cancer if: ? You are a current smoker who has smoked for at least 30 years. ? You are a former smoker who has quit within the past 15 years.  Talk to your health care provider about your screening options, when you should start screening, and how often you should be screened.  Colorectal Cancer  Routine colorectal cancer screening usually begins at 62 years of age and should be repeated every 5-10 years until you are 62 years old. You may need to be screened more often if early forms of precancerous polyps or small growths are found. Your health care provider may recommend screening at an earlier age if you have risk factors for colon cancer.  Your health care provider may recommend using home test kits to check for hidden blood in the stool.  A small camera at the end of a tube can be used to examine your colon (sigmoidoscopy or colonoscopy). This checks for the earliest forms of colorectal cancer.  Prostate and Testicular Cancer  Depending on your age and overall health, your health care provider may do certain tests to screen for prostate and testicular cancer.  Talk to your health care provider about any symptoms or concerns you have about testicular or prostate cancer.  Skin Cancer  Check your skin from head to toe regularly.  Tell your health care provider about any new moles or changes in moles, especially if: ? There is a change in a mole's size, shape, or color. ? You have a mole that  is larger than a pencil eraser.  Always use sunscreen. Apply sunscreen liberally and repeat throughout the day.  Protect yourself by wearing long sleeves, pants, a wide-brimmed hat, and sunglasses when outside.  What should I know about heart disease, diabetes, and high blood pressure?  If you are 5-54 years of age, have your blood pressure checked every 3-5 years. If you are 43 years of age or older, have your blood pressure checked every year. You should have your blood pressure  measured twice-once when you are at a hospital or clinic, and once when you are not at a hospital or clinic. Record the average of the two measurements. To check your blood pressure when you are not at a hospital or clinic, you can use: ? An automated blood pressure machine at a pharmacy. ? A home blood pressure monitor.  Talk to your health care provider about your target blood pressure.  If you are between 28-34 years old, ask your health care provider if you should take aspirin to prevent heart disease.  Have regular diabetes screenings by checking your fasting blood sugar level. ? If you are at a normal weight and have a low risk for diabetes, have this test once every three years after the age of 59. ? If you are overweight and have a high risk for diabetes, consider being tested at a younger age or more often.  A one-time screening for abdominal aortic aneurysm (AAA) by ultrasound is recommended for men aged 10-75 years who are current or former smokers. What should I know about preventing infection? Hepatitis B If you have a higher risk for hepatitis B, you should be screened for this virus. Talk with your health care provider to find out if you are at risk for hepatitis B infection. Hepatitis C Blood testing is recommended for:  Everyone born from [birth year] through 1966.  Anyone with known risk factors for hepatitis C.  Sexually Transmitted Diseases (STDs)  You should be screened each year for STDs including gonorrhea and chlamydia if: ? You are sexually active and are younger than 62 years of age. ? You are older than 62 years of age and your health care provider tells you that you are at risk for this type of infection. ? Your sexual activity has changed since you were last screened and you are at an increased risk for chlamydia or gonorrhea. Ask your health care provider if you are at risk.  Talk with your health care provider about whether you are at high risk of being infected  with HIV. Your health care provider may recommend a prescription medicine to help prevent HIV infection.  What else can I do?  Schedule regular health, dental, and eye exams.  Stay current with your vaccines (immunizations).  Do not use any tobacco products, such as cigarettes, chewing tobacco, and e-cigarettes. If you need help quitting, ask your health care provider.  Limit alcohol intake to no more than 2 drinks per day. One drink equals 12 ounces of beer, 5 ounces of wine, or 1 ounces of hard liquor.  Do not use street drugs.  Do not share needles.  Ask your health care provider for help if you need support or information about quitting drugs.  Tell your health care provider if you often feel depressed.  Tell your health care provider if you have ever been abused or do not feel safe at home. This information is not intended to replace advice given to you by your health  care provider. Make sure you discuss any questions you have with your health care provider. Document Released: 04/29/2008 Document Revised: 06/30/2016 Document Reviewed: 08/05/2015 Elsevier Interactive Patient Education  [employer].

## 2017-06-24 NOTE — Assessment & Plan Note (Addendum)
Asymptomatic  Not following with GI - will f/u when colonoscopy is due Not on any medication

## 2017-06-24 NOTE — Assessment & Plan Note (Signed)
GERD controlled Continue daily medication - may try to take every other day - stressed importance of keeping GERD controlled

## 2017-06-24 NOTE — Assessment & Plan Note (Signed)
On ASA Rate controlled Following with cardiology

## 2017-06-25 LAB — HIV ANTIBODY (ROUTINE TESTING W REFLEX): HIV 1&2 Ab, 4th Generation: NONREACTIVE

## 2017-06-27 LAB — PSA, TOTAL AND FREE
PSA, % Free: 33 % (ref >25)
PSA, Free: 0.3 ng/mL
PSA, Total: 0.9 ng/mL (ref <=4.0)

## 2017-06-29 ENCOUNTER — Encounter: Payer: Self-pay | Admitting: Internal Medicine

## 2017-07-06 DIAGNOSIS — I480 Paroxysmal atrial fibrillation: Secondary | ICD-10-CM | POA: Diagnosis not present

## 2017-07-06 DIAGNOSIS — Z9889 Other specified postprocedural states: Secondary | ICD-10-CM | POA: Diagnosis not present

## 2017-07-06 DIAGNOSIS — I483 Typical atrial flutter: Secondary | ICD-10-CM | POA: Diagnosis not present

## 2017-07-06 DIAGNOSIS — I10 Essential (primary) hypertension: Secondary | ICD-10-CM | POA: Diagnosis not present

## 2017-09-12 ENCOUNTER — Other Ambulatory Visit: Payer: Self-pay

## 2017-09-12 MED ORDER — PANTOPRAZOLE SODIUM 40 MG PO TBEC: 40.0000 mg | DELAYED_RELEASE_TABLET | Freq: Every day | ORAL | 2 refills | Status: DC

## 2018-01-09 DIAGNOSIS — I480 Paroxysmal atrial fibrillation: Secondary | ICD-10-CM | POA: Diagnosis not present

## 2018-01-09 DIAGNOSIS — K50919 Crohn's disease, unspecified, with unspecified complications: Secondary | ICD-10-CM | POA: Diagnosis not present

## 2018-01-09 DIAGNOSIS — K219 Gastro-esophageal reflux disease without esophagitis: Secondary | ICD-10-CM | POA: Diagnosis not present

## 2018-01-09 DIAGNOSIS — Z9889 Other specified postprocedural states: Secondary | ICD-10-CM | POA: Diagnosis not present

## 2018-04-14 ENCOUNTER — Other Ambulatory Visit (INDEPENDENT_AMBULATORY_CARE_PROVIDER_SITE_OTHER): Payer: BLUE CROSS/BLUE SHIELD

## 2018-04-14 ENCOUNTER — Encounter: Payer: Self-pay | Admitting: Internal Medicine

## 2018-04-14 ENCOUNTER — Ambulatory Visit: Payer: BLUE CROSS/BLUE SHIELD | Admitting: Internal Medicine

## 2018-04-14 VITALS — BP 150/86 | HR 84 | Temp 98.3°F | Resp 16 | Wt 189.0 lb

## 2018-04-14 DIAGNOSIS — K625 Hemorrhage of anus and rectum: Secondary | ICD-10-CM | POA: Insufficient documentation

## 2018-04-14 DIAGNOSIS — J209 Acute bronchitis, unspecified: Secondary | ICD-10-CM

## 2018-04-14 DIAGNOSIS — R252 Cramp and spasm: Secondary | ICD-10-CM | POA: Diagnosis not present

## 2018-04-14 LAB — COMPREHENSIVE METABOLIC PANEL
ALT: 15 U/L (ref 0–53)
AST: 20 U/L (ref 0–37)
Albumin: 4.1 g/dL (ref 3.5–5.2)
Alkaline Phosphatase: 76 U/L (ref 39–117)
BUN: 8 mg/dL (ref 6–23)
CO2: 28 mEq/L (ref 19–32)
Calcium: 9.2 mg/dL (ref 8.4–10.5)
Chloride: 92 mEq/L — ABNORMAL LOW (ref 96–112)
Creatinine, Ser: 1.05 mg/dL (ref 0.40–1.50)
GFR: 75.86 mL/min (ref >60.00)
Glucose, Bld: 96 mg/dL (ref 70–99)
Potassium: 4.2 mEq/L (ref 3.5–5.1)
Sodium: 127 mEq/L — ABNORMAL LOW (ref 135–145)
Total Bilirubin: 0.6 mg/dL (ref 0.2–1.2)
Total Protein: 7.5 g/dL (ref 6.0–8.3)

## 2018-04-14 LAB — CBC WITH DIFFERENTIAL/PLATELET
Basophils Absolute: 0 10*3/uL (ref 0.0–0.1)
Basophils Relative: 0.4 % (ref 0.0–3.0)
Eosinophils Absolute: 0.3 10*3/uL (ref 0.0–0.7)
Eosinophils Relative: 3.6 % (ref 0.0–5.0)
HCT: 37 % — ABNORMAL LOW (ref 39.0–52.0)
Hemoglobin: 12.7 g/dL — ABNORMAL LOW (ref 13.0–17.0)
Lymphocytes Relative: 21.9 % (ref 12.0–46.0)
Lymphs Abs: 2.1 10*3/uL (ref 0.7–4.0)
MCHC: 34.4 g/dL (ref 30.0–36.0)
MCV: 82.6 fl (ref 78.0–100.0)
Monocytes Absolute: 1.2 10*3/uL — ABNORMAL HIGH (ref 0.1–1.0)
Monocytes Relative: 12 % (ref 3.0–12.0)
Neutro Abs: 6.1 10*3/uL (ref 1.4–7.7)
Neutrophils Relative %: 62.1 % (ref 43.0–77.0)
Platelets: 265 10*3/uL (ref 150.0–400.0)
RBC: 4.48 Mil/uL (ref 4.22–5.81)
RDW: 13.9 % (ref 11.5–15.5)
WBC: 9.7 10*3/uL (ref 4.0–10.5)

## 2018-04-14 LAB — IBC PANEL
Iron: 16 ug/dL — ABNORMAL LOW (ref 42–165)
Saturation Ratios: 4.2 % — ABNORMAL LOW (ref 20.0–50.0)
Transferrin: 273 mg/dL (ref 212.0–360.0)

## 2018-04-14 LAB — IRON: Iron: 16 ug/dL — ABNORMAL LOW (ref 42–165)

## 2018-04-14 LAB — FERRITIN: Ferritin: 18.4 ng/mL — ABNORMAL LOW (ref 22.0–322.0)

## 2018-04-14 LAB — MAGNESIUM: Magnesium: 2.1 mg/dL (ref 1.5–2.5)

## 2018-04-14 MED ORDER — HYDROCODONE-HOMATROPINE 5-1.5 MG/5ML PO SYRP: 5.0000 mL | ORAL_SOLUTION | Freq: Three times a day (TID) | ORAL | 0 refills | Status: DC | PRN

## 2018-04-14 MED ORDER — BENZONATATE 200 MG PO CAPS: 200.0000 mg | ORAL_CAPSULE | Freq: Three times a day (TID) | ORAL | 0 refills | Status: DC | PRN

## 2018-04-14 MED ORDER — CEFDINIR 300 MG PO CAPS: 300.0000 mg | ORAL_CAPSULE | Freq: Two times a day (BID) | ORAL | 0 refills | Status: DC

## 2018-04-14 NOTE — Assessment & Plan Note (Signed)
Symptoms c/w bronchitis Tessalon perles or hycodan syrup prn omnicef bid x 10 days Rest, fluids  Call if no improvement

## 2018-04-14 NOTE — Assessment & Plan Note (Signed)
Has had rectal bleeding, no other symptoms suggestive of crohn's He thinks it is hemorrhoids Stopped two weeks ago after starting bee pollen Will check cbc, iron given muscle cramps

## 2018-04-14 NOTE — Assessment & Plan Note (Signed)
Cramping in legs mostly at night Intermittent Drinking enough water Taking potassium and magnesium otc Will check cbc, cmp, mag, iron He will ask his brother what he is taking

## 2018-04-14 NOTE — Patient Instructions (Addendum)
Take the antibiotic and cough medicaiton as prescribed.  Call if no improvement    Have blood work done for your muscle cramping.      Leg Cramps Leg cramps occur when a muscle or muscles tighten and you have no control over this tightening (involuntary muscle contraction). Muscle cramps can develop in any muscle, but the most common place is in the calf muscles of the leg. Those cramps can occur during exercise or when you are at rest. Leg cramps are painful, and they may last for a few seconds to a few minutes. Cramps may return several times before they finally stop. Usually, leg cramps are not caused by a serious medical problem. In many cases, the cause is not known. Some common causes include:  Overexertion.  Overuse from repetitive motions, or doing the same thing over and over.  Remaining in a certain position for a long period of time.  Improper preparation, form, or technique while performing a sport or an activity.  Dehydration.  Injury.  Side effects of some medicines.  Abnormally low levels of the salts and ions in your blood (electrolytes), especially potassium and calcium. These levels could be low if you are taking water pills (diuretics) or if you are pregnant.  Follow these instructions at home: Watch your condition for any changes. Taking the following actions may help to lessen any discomfort that you are feeling:  Stay well-hydrated. Drink enough fluid to keep your urine clear or pale yellow.  Try massaging, stretching, and relaxing the affected muscle. Do this for several minutes at a time.  For tight or tense muscles, use a warm towel, heating pad, or hot shower water directed to the affected area.  If you are sore or have pain after a cramp, applying ice to the affected area may relieve discomfort. ? Put ice in a plastic bag. ? Place a towel between your skin and the bag. ? Leave the ice on for 20 minutes, 2-3 times per day.  Avoid strenuous exercise  for several days if you have been having frequent leg cramps.  Make sure that your diet includes the essential minerals for your muscles to work normally.  Take medicines only as directed by your health care provider.  Contact a health care provider if:  Your leg cramps get more severe or more frequent, or they do not improve over time.  Your foot becomes cold, numb, or blue. This information is not intended to replace advice given to you by your health care provider. Make sure you discuss any questions you have with your health care provider. Document Released: 12/09/2004 Document Revised: 04/08/2016 Document Reviewed: 10/09/2014 Elsevier Interactive Patient Education  [employer].

## 2018-04-14 NOTE — Progress Notes (Signed)
Subjective:    Patient ID: Hunter Edwards, male    DOB: 04-21-1956, 62 y.o.   MRN: [id number]  HPI He is here for an acute visit for cold symptoms.  His symptoms started 5 days.  He is experiencing chills, congestion, ear pain, PND, sore throat, productive cough, sob last night, wheeze, myalgias, headaches.  He denies fever, sinus pain, chest pain or tightness and nausea/diarrhea.  For the past few weeks or so he has been having cramping in his legs.  He is taking otc and magnesium.  He is drinking a lot of water during the day.  He brother has cramping too and takes a medication for the cramping and it works well.  He is unsure what it is.   He has been having rectal bleeding for a while.  He started taking bee pollen tabs for the past two weeks and it has stopped.     Medications and allergies reviewed with patient and updated if appropriate.  Patient Active Problem List   Diagnosis Date Noted  . Atypical chest pain 11/19/2016  . Routine general medical examination at a health care facility 06/16/2015  . Atrial fibrillation ([place]) 05/07/2015  . GERD (gastroesophageal reflux disease) 05/07/2015  . Crohn disease ([place]) 05/07/2015    Current Outpatient Medications on File Prior to Visit  Medication Sig Dispense Refill  . aspirin 81 MG tablet Take 81 mg by mouth daily.    Marland Kitchen losartan (COZAAR) 25 MG tablet Take 25 mg by mouth. Take one by mouth every other day.    . pantoprazole (PROTONIX) 40 MG tablet Take 1 tablet (40 mg total) by mouth daily. 90 tablet 2   No current facility-administered medications on file prior to visit.     Past Medical History:  Diagnosis Date  . Anal fistula   . Anorectal fistula   . Atrial fibrillation ([place]) 2016  . Benign hypertension   . Cataract   . Chest pain   . Colitis   . Contact dermatitis   . Crohn's disease ([place]) 2011  . Disease of jaw   . Dysrhythmia 01/14/15   AFib  . Fatigue   . Folliculitis   . GERD (gastroesophageal reflux  disease)   . History of Holter monitoring    Will be done on Friday, 01/17/15  . Hyperlipidemia   . IDA (iron deficiency anemia)   . Need for Tdap vaccination   . Neoplasm of prostate   . Palpitations   . Perirectal abscess   . Rash   . Rectal discharge     Past Surgical History:  Procedure Laterality Date  . ANAL FISTULECTOMY N/A 01/24/2015   Procedure: FISTULECTOMY ANAL;  Surgeon: [name], MD;  Location: [facility];  Service: General;  Laterality: N/A;  . RECTAL EXAM UNDER ANESTHESIA N/A 01/24/2015   Procedure: ANAL  EXAM UNDER ANESTHESIA;  Surgeon: [name], MD;  Location: [place];  Service: General;  Laterality: N/A;  . TONSILLECTOMY  1962    Social History   Socioeconomic History  . Marital status: Married    Spouse name: Not on file  . Number of children: 1  . Years of education: Not on file  . Highest education level: Not on file  Occupational History  . Occupation: Truck Diplomatic Services operational officer  . Financial resource strain: Not on file  . Food insecurity:    Worry: Not on file    Inability: Not on file  . Transportation  needs:    Medical: Not on file    Non-medical: Not on file  Tobacco Use  . Smoking status: Former Smoker    Packs/day: 1.00    Types: Cigarettes    Last attempt to quit: 11/15/1996    Years since quitting: 21.4  . Smokeless tobacco: Never Used  Substance and Sexual Activity  . Alcohol use: No    Alcohol/week: 0.0 oz    Comment: Rarely  . Drug use: No  . Sexual activity: Not on file  Lifestyle  . Physical activity:    Days per week: Not on file    Minutes per session: Not on file  . Stress: Not on file  Relationships  . Social connections:    Talks on phone: Not on file    Gets together: Not on file    Attends religious service: Not on file    Active member of club or organization: Not on file    Attends meetings of clubs or organizations: Not on file    Relationship status: Not on file    Other Topics Concern  . Not on file  Social History Narrative  . Not on file    Family History  Problem Relation Age of Onset  . Stomach cancer Father        died in his 16's  . Diabetes Brother   . Heart disease Brother   . Colon cancer Neg Hx   . Colon polyps Neg Hx   . Kidney disease Neg Hx   . Esophageal cancer Neg Hx   . Gallbladder disease Neg Hx     Review of Systems  Constitutional: Positive for chills. Negative for fever.  HENT: Positive for congestion, ear pain, postnasal drip and sore throat. Negative for sinus pressure.   Respiratory: Positive for cough (productive), shortness of breath (last night) and wheezing. Negative for chest tightness.   Cardiovascular: Negative for chest pain.  Gastrointestinal: Negative for abdominal pain, diarrhea and nausea.  Musculoskeletal: Positive for myalgias.  Neurological: Positive for headaches (from coughing).       Objective:   Vitals:   04/14/18 1532  BP: (!) 150/86  Pulse: 84  Resp: 16  Temp: 98.3 F (36.8 C)  SpO2: 95%   Filed Weights   04/14/18 1532  Weight: 189 lb (85.7 kg)   Body mass index is 30.05 kg/m.  Wt Readings from Last 3 Encounters:  04/14/18 189 lb (85.7 kg)  06/24/17 189 lb (85.7 kg)  11/19/16 197 lb 4 oz (89.5 kg)     Physical Exam GENERAL APPEARANCE: Appears stated age, well appearing, NAD EYES: conjunctiva clear, no icterus HEENT: bilateral tympanic membranes and ear canals normal, oropharynx with mild erythema, no thyromegaly, trachea midline, no cervical or supraclavicular lymphadenopathy LUNGS: unlabored breathing, good air entry bilaterally, mild rhonchi b/l bases CARDIOVASCULAR: Normal S1,S2 without murmurs, no edema SKIN: warm, dry        Assessment & Plan:   See Problem List for Assessment and Plan of chronic medical problems.

## 2018-04-16 ENCOUNTER — Encounter: Payer: Self-pay | Admitting: Internal Medicine

## 2018-04-16 ENCOUNTER — Other Ambulatory Visit: Payer: Self-pay | Admitting: Internal Medicine

## 2018-04-16 DIAGNOSIS — D509 Iron deficiency anemia, unspecified: Secondary | ICD-10-CM

## 2018-04-16 DIAGNOSIS — E871 Hypo-osmolality and hyponatremia: Secondary | ICD-10-CM

## 2018-04-17 MED ORDER — TIZANIDINE HCL 2 MG PO TABS: 2.0000 mg | ORAL_TABLET | Freq: Four times a day (QID) | ORAL | 0 refills | Status: DC | PRN

## 2018-04-18 ENCOUNTER — Encounter: Payer: Self-pay | Admitting: Internal Medicine

## 2018-04-18 MED ORDER — PREDNISONE 10 MG PO TABS: ORAL_TABLET | ORAL | 0 refills | Status: DC

## 2018-05-26 ENCOUNTER — Encounter: Payer: Self-pay | Admitting: Internal Medicine

## 2018-05-29 ENCOUNTER — Encounter: Payer: Self-pay | Admitting: Internal Medicine

## 2018-05-29 ENCOUNTER — Other Ambulatory Visit (INDEPENDENT_AMBULATORY_CARE_PROVIDER_SITE_OTHER): Payer: BLUE CROSS/BLUE SHIELD

## 2018-05-29 ENCOUNTER — Ambulatory Visit: Payer: BLUE CROSS/BLUE SHIELD | Admitting: Internal Medicine

## 2018-05-29 VITALS — BP 140/80 | HR 59 | Temp 97.6°F | Resp 16 | Wt 191.0 lb

## 2018-05-29 DIAGNOSIS — D509 Iron deficiency anemia, unspecified: Secondary | ICD-10-CM | POA: Diagnosis not present

## 2018-05-29 DIAGNOSIS — R21 Rash and other nonspecific skin eruption: Secondary | ICD-10-CM

## 2018-05-29 LAB — COMPREHENSIVE METABOLIC PANEL
ALT: 14 U/L (ref 0–53)
AST: 18 U/L (ref 0–37)
Albumin: 4.1 g/dL (ref 3.5–5.2)
Alkaline Phosphatase: 72 U/L (ref 39–117)
BUN: 11 mg/dL (ref 6–23)
CO2: 28 mEq/L (ref 19–32)
Calcium: 9.5 mg/dL (ref 8.4–10.5)
Chloride: 100 mEq/L (ref 96–112)
Creatinine, Ser: 1.33 mg/dL (ref 0.40–1.50)
GFR: 57.73 mL/min — ABNORMAL LOW (ref >60.00)
Glucose, Bld: 86 mg/dL (ref 70–99)
Potassium: 3.9 mEq/L (ref 3.5–5.1)
Sodium: 135 mEq/L (ref 135–145)
Total Bilirubin: 0.8 mg/dL (ref 0.2–1.2)
Total Protein: 7 g/dL (ref 6.0–8.3)

## 2018-05-29 LAB — CBC WITH DIFFERENTIAL/PLATELET
Basophils Absolute: 0.1 10*3/uL (ref 0.0–0.1)
Basophils Relative: 0.7 % (ref 0.0–3.0)
Eosinophils Absolute: 0.4 10*3/uL (ref 0.0–0.7)
Eosinophils Relative: 4.2 % (ref 0.0–5.0)
HCT: 41.2 % (ref 39.0–52.0)
Hemoglobin: 13.9 g/dL (ref 13.0–17.0)
Lymphocytes Relative: 19.3 % (ref 12.0–46.0)
Lymphs Abs: 1.7 10*3/uL (ref 0.7–4.0)
MCHC: 33.8 g/dL (ref 30.0–36.0)
MCV: 85.5 fl (ref 78.0–100.0)
Monocytes Absolute: 0.8 10*3/uL (ref 0.1–1.0)
Monocytes Relative: 9 % (ref 3.0–12.0)
Neutro Abs: 5.9 10*3/uL (ref 1.4–7.7)
Neutrophils Relative %: 66.8 % (ref 43.0–77.0)
Platelets: 279 10*3/uL (ref 150.0–400.0)
RBC: 4.81 Mil/uL (ref 4.22–5.81)
RDW: 15.8 % — ABNORMAL HIGH (ref 11.5–15.5)
WBC: 8.9 10*3/uL (ref 4.0–10.5)

## 2018-05-29 LAB — IRON: Iron: 145 ug/dL (ref 42–165)

## 2018-05-29 LAB — C-REACTIVE PROTEIN: CRP: 0.2 mg/dL — ABNORMAL LOW (ref 0.5–20.0)

## 2018-05-29 LAB — SEDIMENTATION RATE: Sed Rate: 22 mm/hr — ABNORMAL HIGH (ref 0–20)

## 2018-05-29 MED ORDER — LIDOCAINE 5 % EX OINT: 1.0000 "application " | TOPICAL_OINTMENT | CUTANEOUS | 0 refills | Status: DC | PRN

## 2018-05-29 MED ORDER — HYDROXYZINE HCL 25 MG PO TABS: 25.0000 mg | ORAL_TABLET | Freq: Three times a day (TID) | ORAL | 0 refills | Status: DC | PRN

## 2018-05-29 NOTE — Progress Notes (Signed)
Subjective:    Patient ID: Hunter Edwards, male    DOB: 03-26-1956, 62 y.o.   MRN: [id number]  HPI The patient is here for an acute visit.  RAsh - it started over 20 years ago.  For the past 12 years he has only had little flares in the summer typically.  The rash does not occur during winter.  He always has a little area around his elbows.  Last month the rash flared up and was severe.  It is extremely itchy.  He thinks the sweat and heat make it worse.  Last year he did not have a rash so it is not just the sweating and heat that cause it.  The rash is located on his chest, arms, posterior left leg, very mild on his stomach.  I did prescribe the prednisone last month and it did help, but did not make the rash go away completely.  The rash recurred and is worse now.  In the past-years ago, he did see allergist and more than one dermatologist and no cause was found.  He was told in the past that he had psoriasis, but treatment for psoriasis did not help.  His Crohn's disease has been fine.  He has not had any flares in a long time.  He is not currently on any treatment for it.  He is taking Benadryl 25 mg every 4 hours and that helps a little.  He is using alcohol on his arms and that seems to help.   There is been no change in products and he has never been able to identify anything that he comes in contact with that causes it.  He has mild iron deficiency anemia: Needs to have iron and blood counts rechecked.  In the past he did notice that the rash got better when eliminating celiac.  Test for celiac disease in the past have been negative.  Medications and allergies reviewed with patient and updated if appropriate.  Patient Active Problem List   Diagnosis Date Noted  . Rectal bleeding 04/14/2018  . Acute bronchitis 04/14/2018  . Muscle cramp 04/14/2018  . Atypical chest pain 11/19/2016  . Routine general medical examination at a health care facility 06/16/2015  . Atrial  fibrillation ([place]) 05/07/2015  . GERD (gastroesophageal reflux disease) 05/07/2015  . Crohn disease ([place]) 05/07/2015    Current Outpatient Medications on File Prior to Visit  Medication Sig Dispense Refill  . aspirin 81 MG tablet Take 81 mg by mouth daily.    Raelyn Ensign Pollen 550 MG CAPS Take 2 capsules by mouth daily.    Marland Kitchen losartan (COZAAR) 25 MG tablet Take 25 mg by mouth. Take one by mouth every other day.    . pantoprazole (PROTONIX) 40 MG tablet Take 1 tablet (40 mg total) by mouth daily. 90 tablet 2  . tiZANidine (ZANAFLEX) 2 MG tablet Take 1 tablet (2 mg total) by mouth every 6 (six) hours as needed for muscle spasms. 30 tablet 0   No current facility-administered medications on file prior to visit.     Past Medical History:  Diagnosis Date  . Anal fistula   . Anorectal fistula   . Atrial fibrillation ([place]) 2016  . Benign hypertension   . Cataract   . Chest pain   . Colitis   . Contact dermatitis   . Crohn's disease ([place]) 2011  . Disease of jaw   . Dysrhythmia 01/14/15   AFib  . Fatigue   .  Folliculitis   . GERD (gastroesophageal reflux disease)   . History of Holter monitoring    Will be done on Friday, 01/17/15  . Hyperlipidemia   . IDA (iron deficiency anemia)   . Need for Tdap vaccination   . Neoplasm of prostate   . Palpitations   . Perirectal abscess   . Rash   . Rectal discharge     Past Surgical History:  Procedure Laterality Date  . ANAL FISTULECTOMY N/A 01/24/2015   Procedure: FISTULECTOMY ANAL;  Surgeon: [name], MD;  Location: [facility];  Service: General;  Laterality: N/A;  . RECTAL EXAM UNDER ANESTHESIA N/A 01/24/2015   Procedure: ANAL  EXAM UNDER ANESTHESIA;  Surgeon: [name], MD;  Location: [place];  Service: General;  Laterality: N/A;  . TONSILLECTOMY  1962    Social History   Socioeconomic History  . Marital status: Married    Spouse name: Not on file  . Number of children: 1  . Years of  education: Not on file  . Highest education level: Not on file  Occupational History  . Occupation: [employer] operational officer  . Financial resource strain: Not on file  . Food insecurity:    Worry: Not on file    Inability: Not on file  . Transportation needs:    Medical: Not on file    Non-medical: Not on file  Tobacco Use  . Smoking status: Former Smoker    Packs/day: 1.00    Types: Cigarettes    Last attempt to quit: 11/15/1996    Years since quitting: 21.5  . Smokeless tobacco: Never Used  Substance and Sexual Activity  . Alcohol use: No    Alcohol/week: 0.0 oz    Comment: Rarely  . Drug use: No  . Sexual activity: Not on file  Lifestyle  . Physical activity:    Days per week: Not on file    Minutes per session: Not on file  . Stress: Not on file  Relationships  . Social connections:    Talks on phone: Not on file    Gets together: Not on file    Attends religious service: Not on file    Active member of club or organization: Not on file    Attends meetings of clubs or organizations: Not on file    Relationship status: Not on file  Other Topics Concern  . Not on file  Social History Narrative  . Not on file    Family History  Problem Relation Age of Onset  . Stomach cancer Father        died in his 19's  . Diabetes Brother   . Heart disease Brother   . Colon cancer Neg Hx   . Colon polyps Neg Hx   . Kidney disease Neg Hx   . Esophageal cancer Neg Hx   . Gallbladder disease Neg Hx     Review of Systems  Constitutional: Negative for chills and fever.  HENT: Negative for sore throat.   Respiratory: Negative for cough, shortness of breath and wheezing.   Skin: Positive for rash (Itchy, nonpainful).  Neurological: Negative for light-headedness and headaches.       Objective:   Vitals:   05/29/18 1546  BP: 140/80  Pulse: (!) 59  Resp: 16  Temp: 97.6 F (36.4 C)  SpO2: 95%   BP Readings from Last 3 Encounters:  05/29/18 140/80  04/14/18 (!)  150/86  06/24/17 122/84   Wt  Readings from Last 3 Encounters:  05/29/18 191 lb (86.6 kg)  04/14/18 189 lb (85.7 kg)  06/24/17 189 lb (85.7 kg)   Body mass index is 30.37 kg/m.   Physical Exam  Constitutional: He appears well-developed and well-nourished. No distress.  HENT:  Head: Normocephalic and atraumatic.  Eyes: Conjunctivae are normal.  Skin: Skin is warm and dry. Rash (Rash primarily on chest and arms-areas of vesicles with generalized erythematous, areas of excoriation and scabs on arms, areas of erythematous macules that are dry appearing, no blisters or ulcers, no drainage from any wounds, no swelling) noted. He is not diaphoretic.           Assessment & Plan:    See Problem List for Assessment and Plan of chronic medical problems.

## 2018-05-29 NOTE — Patient Instructions (Signed)
  Test(s) ordered today. Your results will be released to [place] (or called to you) after review, usually within 72hours after test completion. If any changes need to be made, you will be notified at that same time.    Medications reviewed and updated.  Changes include trying the hydroxyzine for the itch.   Your prescription(s) have been submitted to your pharmacy. Please take as directed and contact our office if you believe you are having problem(s) with the medication(s).  A referral was ordered for dermatology.

## 2018-05-29 NOTE — Assessment & Plan Note (Signed)
Iron deficiency anemia Check CBC, iron levels ?  Possible celiac disease given that his rash has improved with eliminating gluten Check celiac panel

## 2018-05-29 NOTE — Assessment & Plan Note (Signed)
Rashes chronic, but intermittent Response to antihistamines and steroids, but short course of steroids was not effective enough to eliminate it completely We will try hydroxyzine instead of Benadryl He would like to avoid further steroids if possible, but if there is no improvement he will let me know and I will prescribe a longer and higher dose of the steroids I think he needs to be reevaluated by dermatology-we will refer Check CBC, CMP, CRP, ANA, ESR and celiac panel

## 2018-05-30 LAB — FERRITIN: Ferritin: 24.8 ng/mL (ref 22.0–322.0)

## 2018-06-01 LAB — TISSUE TRANSGLUTAMINASE, IGA: (tTG) Ab, IgA: 1 U/mL

## 2018-06-01 LAB — ANA: Anti Nuclear Antibody(ANA): NEGATIVE

## 2018-06-01 LAB — GLIADIN ANTIBODIES, SERUM
Gliadin IgA: 6 Units
Gliadin IgG: 2 Units

## 2018-06-01 LAB — RETICULIN ANTIBODIES, IGA W TITER: Reticulin IGA Screen: NEGATIVE

## 2018-06-02 ENCOUNTER — Encounter: Payer: Self-pay | Admitting: Internal Medicine

## 2018-06-05 ENCOUNTER — Other Ambulatory Visit: Payer: Self-pay | Admitting: Internal Medicine

## 2018-06-09 ENCOUNTER — Encounter: Payer: Self-pay | Admitting: Internal Medicine

## 2018-06-09 ENCOUNTER — Other Ambulatory Visit: Payer: Self-pay | Admitting: Family

## 2018-06-09 MED ORDER — PREDNISONE 10 MG PO TABS: ORAL_TABLET | ORAL | 0 refills | Status: DC

## 2018-06-09 NOTE — Telephone Encounter (Signed)
I will send the prednisone in like he and Dr. Quay Burow discussed; it looks like she wants him to see derm and referral has been updated; please keep that appointment. Sending Rx to pharmacy on file.

## 2018-06-29 NOTE — Patient Instructions (Addendum)
Test(s) ordered today. Your results will be released to [place] (or called to you) after review, usually within 72hours after test completion. If any changes need to be made, you will be notified at that same time.  All other Health Maintenance issues reviewed.   All recommended immunizations and age-appropriate screenings are up-to-date or discussed.  No immunizations administered today.   Medications reviewed and updated.  No changes recommended at this time.   Please followup in one year    Health Maintenance, Male A healthy lifestyle and preventive care is important for your health and wellness. Ask your health care provider about what schedule of regular examinations is right for you. What should I know about weight and diet? Eat a Healthy Diet  Eat plenty of vegetables, fruits, whole grains, low-fat dairy products, and lean protein.  Do not eat a lot of foods high in solid fats, added sugars, or salt.  Maintain a Healthy Weight Regular exercise can help you achieve or maintain a healthy weight. You should:  Do at least 150 minutes of exercise each week. The exercise should increase your heart rate and make you sweat (moderate-intensity exercise).  Do strength-training exercises at least twice a week.  Watch Your Levels of Cholesterol and Blood Lipids  Have your blood tested for lipids and cholesterol every 5 years starting at 63 years of age. If you are at high risk for heart disease, you should start having your blood tested when you are 63 years old. You may need to have your cholesterol levels checked more often if: ? Your lipid or cholesterol levels are high. ? You are older than 63 years of age. ? You are at high risk for heart disease.  What should I know about cancer screening? Many types of cancers can be detected early and may often be prevented. Lung Cancer  You should be screened every year for lung cancer if: ? You are a current smoker who has smoked for  at least 30 years. ? You are a former smoker who has quit within the past 15 years.  Talk to your health care provider about your screening options, when you should start screening, and how often you should be screened.  Colorectal Cancer  Routine colorectal cancer screening usually begins at 63 years of age and should be repeated every 5-10 years until you are 63 years old. You may need to be screened more often if early forms of precancerous polyps or small growths are found. Your health care provider may recommend screening at an earlier age if you have risk factors for colon cancer.  Your health care provider may recommend using home test kits to check for hidden blood in the stool.  A small camera at the end of a tube can be used to examine your colon (sigmoidoscopy or colonoscopy). This checks for the earliest forms of colorectal cancer.  Prostate and Testicular Cancer  Depending on your age and overall health, your health care provider may do certain tests to screen for prostate and testicular cancer.  Talk to your health care provider about any symptoms or concerns you have about testicular or prostate cancer.  Skin Cancer  Check your skin from head to toe regularly.  Tell your health care provider about any new moles or changes in moles, especially if: ? There is a change in a mole's size, shape, or color. ? You have a mole that is larger than a pencil eraser.  Always use sunscreen. Apply sunscreen liberally  and repeat throughout the day.  Protect yourself by wearing long sleeves, pants, a wide-brimmed hat, and sunglasses when outside.  What should I know about heart disease, diabetes, and high blood pressure?  If you are 40-32 years of age, have your blood pressure checked every 3-5 years. If you are 84 years of age or older, have your blood pressure checked every year. You should have your blood pressure measured twice-once when you are at a hospital or clinic, and once  when you are not at a hospital or clinic. Record the average of the two measurements. To check your blood pressure when you are not at a hospital or clinic, you can use: ? An automated blood pressure machine at a pharmacy. ? A home blood pressure monitor.  Talk to your health care provider about your target blood pressure.  If you are between 16-86 years old, ask your health care provider if you should take aspirin to prevent heart disease.  Have regular diabetes screenings by checking your fasting blood sugar level. ? If you are at a normal weight and have a low risk for diabetes, have this test once every three years after the age of 87. ? If you are overweight and have a high risk for diabetes, consider being tested at a younger age or more often.  A one-time screening for abdominal aortic aneurysm (AAA) by ultrasound is recommended for men aged 79-75 years who are current or former smokers. What should I know about preventing infection? Hepatitis B If you have a higher risk for hepatitis B, you should be screened for this virus. Talk with your health care provider to find out if you are at risk for hepatitis B infection. Hepatitis C Blood testing is recommended for:  Everyone born from [birth year] through 1966.  Anyone with known risk factors for hepatitis C.  Sexually Transmitted Diseases (STDs)  You should be screened each year for STDs including gonorrhea and chlamydia if: ? You are sexually active and are younger than 63 years of age. ? You are older than 63 years of age and your health care provider tells you that you are at risk for this type of infection. ? Your sexual activity has changed since you were last screened and you are at an increased risk for chlamydia or gonorrhea. Ask your health care provider if you are at risk.  Talk with your health care provider about whether you are at high risk of being infected with HIV. Your health care provider may recommend a prescription  medicine to help prevent HIV infection.  What else can I do?  Schedule regular health, dental, and eye exams.  Stay current with your vaccines (immunizations).  Do not use any tobacco products, such as cigarettes, chewing tobacco, and e-cigarettes. If you need help quitting, ask your health care provider.  Limit alcohol intake to no more than 2 drinks per day. One drink equals 12 ounces of beer, 5 ounces of wine, or 1 ounces of hard liquor.  Do not use street drugs.  Do not share needles.  Ask your health care provider for help if you need support or information about quitting drugs.  Tell your health care provider if you often feel depressed.  Tell your health care provider if you have ever been abused or do not feel safe at home. This information is not intended to replace advice given to you by your health care provider. Make sure you discuss any questions you have with your health  care provider. Document Released: 04/29/2008 Document Revised: 06/30/2016 Document Reviewed: 08/05/2015 Elsevier Interactive Patient Education  [employer].

## 2018-06-29 NOTE — Progress Notes (Signed)
Subjective:    Patient ID: Hunter Edwards, male    DOB: 1956/08/19, 62 y.o.   MRN: [id number]  HPI He is here for a physical exam.   He has no concerns or questions.  He continues to have the rash on his chest and arms.  The second round of steroids helped a little.  The rash is coming down, but still present.  It is itchy at times.  He has not schedule an appointment with dermatology yet because of he was on vacation when they were able to see him, but he will schedule an appointment.  Medications and allergies reviewed with patient and updated if appropriate.  Patient Active Problem List   Diagnosis Date Noted  . Rash and nonspecific skin eruption 05/29/2018  . Iron deficiency anemia 05/29/2018  . Rectal bleeding 04/14/2018  . Muscle cramp 04/14/2018  . Atypical chest pain 11/19/2016  . Atrial fibrillation ([place]) 05/07/2015  . GERD (gastroesophageal reflux disease) 05/07/2015  . Crohn disease ([place]) 05/07/2015    Current Outpatient Medications on File Prior to Visit  Medication Sig Dispense Refill  . aspirin 81 MG tablet Take 81 mg by mouth daily.    Raelyn Ensign Pollen 550 MG CAPS Take 2 capsules by mouth daily.    . hydrOXYzine (ATARAX/VISTARIL) 25 MG tablet Take 1 tablet (25 mg total) by mouth 3 (three) times daily as needed. 60 tablet 0  . lidocaine (XYLOCAINE) 5 % ointment Apply 1 application topically as needed. 240 g 0  . losartan (COZAAR) 25 MG tablet Take 25 mg by mouth. Take one by mouth every other day.    Marland Kitchen MAGNESIUM PO Take by mouth daily.    . pantoprazole (PROTONIX) 40 MG tablet Take 1 tablet (40 mg total) by mouth daily. Annual appt due in August must see provider for future refills 90 tablet 0  . POTASSIUM PO Take by mouth daily.     No current facility-administered medications on file prior to visit.     Past Medical History:  Diagnosis Date  . Anal fistula   . Anorectal fistula   . Atrial fibrillation ([place]) 2016  . Benign hypertension   . Cataract   .  Chest pain   . Colitis   . Contact dermatitis   . Crohn's disease ([place]) 2011  . Disease of jaw   . Dysrhythmia 01/14/15   AFib  . Fatigue   . Folliculitis   . GERD (gastroesophageal reflux disease)   . History of Holter monitoring    Will be done on Friday, 01/17/15  . Hyperlipidemia   . IDA (iron deficiency anemia)   . Need for Tdap vaccination   . Neoplasm of prostate   . Palpitations   . Perirectal abscess   . Rash   . Rectal discharge     Past Surgical History:  Procedure Laterality Date  . ANAL FISTULECTOMY N/A 01/24/2015   Procedure: FISTULECTOMY ANAL;  Surgeon: [name], MD;  Location: [facility];  Service: General;  Laterality: N/A;  . RECTAL EXAM UNDER ANESTHESIA N/A 01/24/2015   Procedure: ANAL  EXAM UNDER ANESTHESIA;  Surgeon: [name], MD;  Location: [place];  Service: General;  Laterality: N/A;  . TONSILLECTOMY  1962    Social History   Socioeconomic History  . Marital status: Married    Spouse name: Not on file  . Number of children: 1  . Years of education: Not on file  . Highest education level:  Not on file  Occupational History  . Occupation: [employer] operational officer  . Financial resource strain: Not on file  . Food insecurity:    Worry: Not on file    Inability: Not on file  . Transportation needs:    Medical: Not on file    Non-medical: Not on file  Tobacco Use  . Smoking status: Former Smoker    Packs/day: 1.00    Types: Cigarettes    Last attempt to quit: 11/15/1996    Years since quitting: 21.6  . Smokeless tobacco: Never Used  Substance and Sexual Activity  . Alcohol use: No    Alcohol/week: 0.0 standard drinks    Comment: Rarely  . Drug use: No  . Sexual activity: Not on file  Lifestyle  . Physical activity:    Days per week: Not on file    Minutes per session: Not on file  . Stress: Not on file  Relationships  . Social connections:    Talks on phone: Not on file    Gets together: Not  on file    Attends religious service: Not on file    Active member of club or organization: Not on file    Attends meetings of clubs or organizations: Not on file    Relationship status: Not on file  Other Topics Concern  . Not on file  Social History Narrative  . Not on file    Family History  Problem Relation Age of Onset  . Stomach cancer Father        died in his 69's  . Diabetes Brother   . Heart disease Brother   . Colon cancer Neg Hx   . Colon polyps Neg Hx   . Kidney disease Neg Hx   . Esophageal cancer Neg Hx   . Gallbladder disease Neg Hx     Review of Systems  Constitutional: Negative for chills and fever.  Eyes: Negative for visual disturbance.  Respiratory: Positive for cough (for the past day or so). Negative for shortness of breath and wheezing.   Cardiovascular: Negative for chest pain, palpitations and leg swelling.  Gastrointestinal: Negative for abdominal pain, blood in stool, constipation, diarrhea and nausea.  Genitourinary: Negative for dysuria and hematuria.  Musculoskeletal: Negative for arthralgias and back pain.  Skin: Positive for rash. Negative for color change.  Neurological: Negative for headaches.  Psychiatric/Behavioral: Negative for dysphoric mood. The patient is not nervous/anxious.        Objective:   Vitals:   06/30/18 1455  BP: 120/82  Pulse: 74  Resp: 16  Temp: 97.7 F (36.5 C)  SpO2: 98%   Filed Weights   06/30/18 1455  Weight: 186 lb (84.4 kg)   Body mass index is 29.57 kg/m.  Wt Readings from Last 3 Encounters:  06/30/18 186 lb (84.4 kg)  05/29/18 191 lb (86.6 kg)  04/14/18 189 lb (85.7 kg)     Physical Exam Constitutional: He appears well-developed and well-nourished. No distress.  HENT:  Head: Normocephalic and atraumatic.  Right Ear: External ear normal.  Left Ear: External ear normal.  Mouth/Throat: Oropharynx is clear and moist.  Normal ear canals and TM b/l  Eyes: Conjunctivae and EOM are normal.    Neck: Neck supple. No tracheal deviation present. No thyromegaly present.  No carotid bruit  Cardiovascular: Normal rate, regular rhythm, normal heart sounds and intact distal pulses.   No murmur heard. Pulmonary/Chest: Effort normal and breath sounds normal. No respiratory distress. He has  no wheezes. He has no rales.  Abdominal: Soft. He exhibits no distension. There is no tenderness.  Genitourinary: deferred  Musculoskeletal: He exhibits no edema.  Lymphadenopathy:   He has no cervical adenopathy.  Skin: Skin is warm and dry. He is not diaphoretic.  Raised erythematous, bumpy rash upper chest, neck and a dry/scaly, erythematous rash in clusters on arms-focused in antecubital regions Psychiatric: He has a normal mood and affect. His behavior is normal.         Assessment & Plan:   Physical exam: Screening blood work  ordered Immunizations  Dicussed shingrix, others up to date Colonoscopy    Up to date  Eye exams   Not up to date - will schedule EKG     Done 2016 Exercise  Active - pushes his lawnmower, but no regular exercise Weight   slightly overweight-encouraged regular exercise and some weight loss Skin      rash Substance abuse  none  See Problem List for Assessment and Plan of chronic medical problems.  Follow-up annually, sooner if needed

## 2018-06-30 ENCOUNTER — Encounter: Payer: Self-pay | Admitting: Internal Medicine

## 2018-06-30 ENCOUNTER — Other Ambulatory Visit (INDEPENDENT_AMBULATORY_CARE_PROVIDER_SITE_OTHER): Payer: BLUE CROSS/BLUE SHIELD

## 2018-06-30 ENCOUNTER — Ambulatory Visit (INDEPENDENT_AMBULATORY_CARE_PROVIDER_SITE_OTHER): Payer: BLUE CROSS/BLUE SHIELD | Admitting: Internal Medicine

## 2018-06-30 VITALS — BP 120/82 | HR 74 | Temp 97.7°F | Resp 16 | Wt 186.0 lb

## 2018-06-30 DIAGNOSIS — K219 Gastro-esophageal reflux disease without esophagitis: Secondary | ICD-10-CM | POA: Diagnosis not present

## 2018-06-30 DIAGNOSIS — R21 Rash and other nonspecific skin eruption: Secondary | ICD-10-CM | POA: Diagnosis not present

## 2018-06-30 DIAGNOSIS — I4891 Unspecified atrial fibrillation: Secondary | ICD-10-CM

## 2018-06-30 DIAGNOSIS — Z0001 Encounter for general adult medical examination with abnormal findings: Secondary | ICD-10-CM | POA: Diagnosis not present

## 2018-06-30 DIAGNOSIS — Z0000 Encounter for general adult medical examination without abnormal findings: Secondary | ICD-10-CM

## 2018-06-30 DIAGNOSIS — R252 Cramp and spasm: Secondary | ICD-10-CM

## 2018-06-30 LAB — LIPID PANEL
Cholesterol: 182 mg/dL (ref 0–200)
HDL: 55.5 mg/dL (ref >39.00)
LDL Cholesterol: 104 mg/dL — ABNORMAL HIGH (ref 0–99)
NonHDL: 126.66
Total CHOL/HDL Ratio: 3
Triglycerides: 113 mg/dL (ref 0.0–149.0)
VLDL: 22.6 mg/dL (ref 0.0–40.0)

## 2018-06-30 LAB — COMPREHENSIVE METABOLIC PANEL
ALT: 13 U/L (ref 0–53)
AST: 17 U/L (ref 0–37)
Albumin: 4 g/dL (ref 3.5–5.2)
Alkaline Phosphatase: 69 U/L (ref 39–117)
BUN: 14 mg/dL (ref 6–23)
CO2: 29 mEq/L (ref 19–32)
Calcium: 9.8 mg/dL (ref 8.4–10.5)
Chloride: 101 mEq/L (ref 96–112)
Creatinine, Ser: 1.24 mg/dL (ref 0.40–1.50)
GFR: 62.57 mL/min (ref >60.00)
Glucose, Bld: 90 mg/dL (ref 70–99)
Potassium: 4.2 mEq/L (ref 3.5–5.1)
Sodium: 137 mEq/L (ref 135–145)
Total Bilirubin: 1.4 mg/dL — ABNORMAL HIGH (ref 0.2–1.2)
Total Protein: 7.1 g/dL (ref 6.0–8.3)

## 2018-06-30 LAB — TSH: TSH: 1.03 u[IU]/mL (ref 0.35–4.50)

## 2018-06-30 MED ORDER — PANTOPRAZOLE SODIUM 40 MG PO TBEC: 40.0000 mg | DELAYED_RELEASE_TABLET | Freq: Every day | ORAL | 3 refills | Status: DC

## 2018-06-30 MED ORDER — HYDRALAZINE HCL 10 MG PO TABS: 10.0000 mg | ORAL_TABLET | Freq: Three times a day (TID) | ORAL | Status: DC

## 2018-06-30 NOTE — Assessment & Plan Note (Signed)
GERD controlled Continue daily medication  

## 2018-06-30 NOTE — Assessment & Plan Note (Signed)
Improved with potassium, magnesium supplementation and increasing his water intake

## 2018-06-30 NOTE — Assessment & Plan Note (Addendum)
Rate controlled, asymptomatic Taking aspirin daily Continue

## 2018-06-30 NOTE — Assessment & Plan Note (Signed)
Chronic, intermittent Occurs on neck, upper chest and arms Steroids effective, but rash worsens after he completes the steroids Takes hydroxyzine as needed Work-up in the past did not reveal a cause Rash typically better in the winter Still with residual rash, but it is improving Continue hydralazine as needed Will see dermatology

## 2018-07-01 ENCOUNTER — Encounter: Payer: Self-pay | Admitting: Internal Medicine

## 2018-07-19 DIAGNOSIS — K219 Gastro-esophageal reflux disease without esophagitis: Secondary | ICD-10-CM | POA: Diagnosis not present

## 2018-07-19 DIAGNOSIS — I10 Essential (primary) hypertension: Secondary | ICD-10-CM | POA: Diagnosis not present

## 2018-07-19 DIAGNOSIS — I4891 Unspecified atrial fibrillation: Secondary | ICD-10-CM | POA: Diagnosis not present

## 2018-07-19 DIAGNOSIS — K5090 Crohn's disease, unspecified, without complications: Secondary | ICD-10-CM | POA: Diagnosis not present

## 2018-07-20 DIAGNOSIS — L308 Other specified dermatitis: Secondary | ICD-10-CM | POA: Diagnosis not present

## 2018-08-03 DIAGNOSIS — L258 Unspecified contact dermatitis due to other agents: Secondary | ICD-10-CM | POA: Diagnosis not present

## 2018-11-15 ENCOUNTER — Encounter: Payer: Self-pay | Admitting: Internal Medicine

## 2018-11-16 NOTE — Telephone Encounter (Signed)
Spoke with pt in regards and set up a nurse visit for him to get flu shot on 11/17/2018

## 2018-11-17 ENCOUNTER — Ambulatory Visit (INDEPENDENT_AMBULATORY_CARE_PROVIDER_SITE_OTHER): Payer: BLUE CROSS/BLUE SHIELD | Admitting: *Deleted

## 2018-11-17 DIAGNOSIS — Z23 Encounter for immunization: Secondary | ICD-10-CM

## 2018-11-25 ENCOUNTER — Encounter: Payer: Self-pay | Admitting: Internal Medicine

## 2018-11-25 ENCOUNTER — Ambulatory Visit: Payer: BLUE CROSS/BLUE SHIELD | Admitting: Internal Medicine

## 2018-11-25 VITALS — BP 126/82 | HR 94 | Temp 99.0°F | Ht 66.5 in | Wt 197.0 lb

## 2018-11-25 DIAGNOSIS — J029 Acute pharyngitis, unspecified: Secondary | ICD-10-CM

## 2018-11-25 DIAGNOSIS — J020 Streptococcal pharyngitis: Secondary | ICD-10-CM | POA: Diagnosis not present

## 2018-11-25 DIAGNOSIS — I4891 Unspecified atrial fibrillation: Secondary | ICD-10-CM

## 2018-11-25 LAB — POCT RAPID STREP A (OFFICE): Rapid Strep A Screen: POSITIVE — AB

## 2018-11-25 MED ORDER — AZITHROMYCIN 250 MG PO TABS: ORAL_TABLET | ORAL | 0 refills | Status: DC

## 2018-11-25 NOTE — Assessment & Plan Note (Signed)
In NSR 

## 2018-11-25 NOTE — Patient Instructions (Signed)
You can use over-the-counter  "cold" medicines  such as  [place] or [place] cough drops.     Please, make an appointment if you are not better or if you're worse.

## 2018-11-25 NOTE — Assessment & Plan Note (Signed)
Zpac 

## 2018-11-25 NOTE — Progress Notes (Signed)
Subjective:  Patient ID: Hunter Edwards, male    DOB: 1956/07/03  Age: 63 y.o. MRN: [id number]  CC: No chief complaint on file.   HPI CHAUNCEY SCIULLI presents for ST x 2 d. Wife is sick w/strep  Outpatient Medications Prior to Visit  Medication Sig Dispense Refill  . aspirin 81 MG tablet Take 81 mg by mouth daily.    Raelyn Ensign Pollen 550 MG CAPS Take 2 capsules by mouth daily.    Marland Kitchen lidocaine (XYLOCAINE) 5 % ointment Apply 1 application topically as needed. 240 g 0  . losartan (COZAAR) 25 MG tablet Take 25 mg by mouth. Take one by mouth every other day.    Marland Kitchen MAGNESIUM PO Take by mouth daily.    . pantoprazole (PROTONIX) 40 MG tablet Take 1 tablet (40 mg total) by mouth daily. 90 tablet 3  . POTASSIUM PO Take by mouth daily.    . hydrALAZINE (APRESOLINE) 10 MG tablet Take 1 tablet (10 mg total) by mouth 3 (three) times daily. (Patient not taking: Reported on 11/25/2018)     No facility-administered medications prior to visit.     ROS: Review of Systems  Constitutional: Positive for fever. Negative for appetite change, fatigue and unexpected weight change.  HENT: Positive for sore throat. Negative for congestion, nosebleeds, sneezing and trouble swallowing.   Eyes: Negative for itching and visual disturbance.  Respiratory: Negative for cough.   Cardiovascular: Negative for chest pain, palpitations and leg swelling.  Gastrointestinal: Negative for abdominal distention, blood in stool, diarrhea and nausea.  Genitourinary: Negative for frequency and hematuria.  Musculoskeletal: Negative for back pain, gait problem, joint swelling and neck pain.  Skin: Negative for rash.  Neurological: Negative for dizziness, tremors, speech difficulty and weakness.  Psychiatric/Behavioral: Negative for agitation, dysphoric mood and sleep disturbance. The patient is not nervous/anxious.     Objective:  BP 126/82   Pulse 94   Temp 99 F (37.2 C) (Oral)   Ht 5' 6.5" (1.689 m)   Wt 197 lb (89.4  kg)   SpO2 94%   BMI 31.32 kg/m   BP Readings from Last 3 Encounters:  11/25/18 126/82  06/30/18 120/82  05/29/18 140/80    Wt Readings from Last 3 Encounters:  11/25/18 197 lb (89.4 kg)  06/30/18 186 lb (84.4 kg)  05/29/18 191 lb (86.6 kg)    Physical Exam Constitutional:      General: He is not in acute distress.    Appearance: He is well-developed.     Comments: NAD  Eyes:     Conjunctiva/sclera: Conjunctivae normal.     Pupils: Pupils are equal, round, and reactive to light.  Neck:     Musculoskeletal: Normal range of motion.     Thyroid: No thyromegaly.     Vascular: No JVD.  Cardiovascular:     Rate and Rhythm: Normal rate and regular rhythm.     Heart sounds: Normal heart sounds. No murmur. No friction rub. No gallop.   Pulmonary:     Effort: Pulmonary effort is normal. No respiratory distress.     Breath sounds: Normal breath sounds. No wheezing or rales.  Chest:     Chest wall: No tenderness.  Abdominal:     General: Bowel sounds are normal. There is no distension.     Palpations: Abdomen is soft. There is no mass.     Tenderness: There is no abdominal tenderness. There is no guarding or rebound.  Musculoskeletal: Normal range of motion.  General: No tenderness.  Lymphadenopathy:     Cervical: No cervical adenopathy.  Skin:    General: Skin is warm and dry.     Findings: No rash.  Neurological:     Mental Status: He is alert and oriented to person, place, and time.     Cranial Nerves: No cranial nerve deficit.     Motor: No abnormal muscle tone.     Coordination: Coordination normal.     Gait: Gait normal.     Deep Tendon Reflexes: Reflexes are normal and symmetric.  Psychiatric:        Behavior: Behavior normal.        Thought Content: Thought content normal.        Judgment: Judgment normal.     Lab Results  Component Value Date   WBC 8.9 05/29/2018   HGB 13.9 05/29/2018   HCT 41.2 05/29/2018   PLT 279.0 05/29/2018   GLUCOSE 90  06/30/2018   CHOL 182 06/30/2018   TRIG 113.0 06/30/2018   HDL 55.50 06/30/2018   LDLCALC 104 (H) 06/30/2018   ALT 13 06/30/2018   AST 17 06/30/2018   NA 137 06/30/2018   K 4.2 06/30/2018   CL 101 06/30/2018   CREATININE 1.24 06/30/2018   BUN 14 06/30/2018   CO2 29 06/30/2018   TSH 1.03 06/30/2018   INR 1.0 11/04/2016   HGBA1C 5.6 06/24/2017    No results found.  Assessment & Plan:   Diagnoses and all orders for this visit:  Sore throat -     POCT rapid strep A     No orders of the defined types were placed in this encounter.    Follow-up: No follow-ups on file.  Walker Kehr, MD

## 2019-03-19 DIAGNOSIS — K219 Gastro-esophageal reflux disease without esophagitis: Secondary | ICD-10-CM | POA: Diagnosis not present

## 2019-03-19 DIAGNOSIS — I480 Paroxysmal atrial fibrillation: Secondary | ICD-10-CM | POA: Diagnosis not present

## 2019-03-19 DIAGNOSIS — I10 Essential (primary) hypertension: Secondary | ICD-10-CM | POA: Diagnosis not present

## 2019-03-19 DIAGNOSIS — K50919 Crohn's disease, unspecified, with unspecified complications: Secondary | ICD-10-CM | POA: Diagnosis not present

## 2019-05-17 ENCOUNTER — Other Ambulatory Visit: Payer: Self-pay

## 2019-05-17 ENCOUNTER — Telehealth: Payer: Self-pay | Admitting: Internal Medicine

## 2019-05-17 DIAGNOSIS — K50919 Crohn's disease, unspecified, with unspecified complications: Secondary | ICD-10-CM

## 2019-05-17 DIAGNOSIS — R197 Diarrhea, unspecified: Secondary | ICD-10-CM

## 2019-05-17 NOTE — Telephone Encounter (Signed)
Spoke with pt and he will continue Imodium and come for labs on Monday, orders in epic.

## 2019-05-17 NOTE — Telephone Encounter (Signed)
Patient lost to follow-up for several years having previously been on Remicade therapy Certainly this could be a flare of Crohn's disease but he needs to be seen before treatment can be given I would recommend he have a CBC, CMP, CRP, ESR and a GI pathogen panel to start the process

## 2019-05-17 NOTE — Telephone Encounter (Signed)
Pt has OV scheduled with Nevin Bloodgood 7/17. Pt last seen here in 2016. Pt has h/o crohn's, states he is not taking anything currently for crohn's. He states he has not been on antibiotics recently. Pt states he has had diarrhea now for 2 weeks, he has taken Imodium and it has not helped. States there has been some BRB in the stool but he feels this is from his hemorrhoids. Pt wants to know if there is something else he can try prior to OV. Please advise.

## 2019-05-21 ENCOUNTER — Other Ambulatory Visit: Payer: BC Managed Care – PPO

## 2019-05-21 ENCOUNTER — Other Ambulatory Visit (INDEPENDENT_AMBULATORY_CARE_PROVIDER_SITE_OTHER): Payer: BC Managed Care – PPO

## 2019-05-21 ENCOUNTER — Other Ambulatory Visit: Payer: Self-pay

## 2019-05-21 DIAGNOSIS — R197 Diarrhea, unspecified: Secondary | ICD-10-CM

## 2019-05-21 DIAGNOSIS — K50919 Crohn's disease, unspecified, with unspecified complications: Secondary | ICD-10-CM

## 2019-05-21 LAB — COMPREHENSIVE METABOLIC PANEL
ALT: 15 U/L (ref 0–53)
AST: 17 U/L (ref 0–37)
Albumin: 4.1 g/dL (ref 3.5–5.2)
Alkaline Phosphatase: 72 U/L (ref 39–117)
BUN: 16 mg/dL (ref 6–23)
CO2: 26 mEq/L (ref 19–32)
Calcium: 9 mg/dL (ref 8.4–10.5)
Chloride: 103 mEq/L (ref 96–112)
Creatinine, Ser: 1.22 mg/dL (ref 0.40–1.50)
GFR: 59.82 mL/min — ABNORMAL LOW (ref >60.00)
Glucose, Bld: 84 mg/dL (ref 70–99)
Potassium: 4.2 mEq/L (ref 3.5–5.1)
Sodium: 138 mEq/L (ref 135–145)
Total Bilirubin: 1 mg/dL (ref 0.2–1.2)
Total Protein: 7 g/dL (ref 6.0–8.3)

## 2019-05-21 LAB — CBC WITH DIFFERENTIAL/PLATELET
Basophils Absolute: 0.1 10*3/uL (ref 0.0–0.1)
Basophils Relative: 0.6 % (ref 0.0–3.0)
Eosinophils Absolute: 0.2 10*3/uL (ref 0.0–0.7)
Eosinophils Relative: 1.9 % (ref 0.0–5.0)
HCT: 40 % (ref 39.0–52.0)
Hemoglobin: 13.7 g/dL (ref 13.0–17.0)
Lymphocytes Relative: 20.5 % (ref 12.0–46.0)
Lymphs Abs: 2.2 10*3/uL (ref 0.7–4.0)
MCHC: 34.4 g/dL (ref 30.0–36.0)
MCV: 89.3 fl (ref 78.0–100.0)
Monocytes Absolute: 1 10*3/uL (ref 0.1–1.0)
Monocytes Relative: 9.6 % (ref 3.0–12.0)
Neutro Abs: 7.1 10*3/uL (ref 1.4–7.7)
Neutrophils Relative %: 67.4 % (ref 43.0–77.0)
Platelets: 276 10*3/uL (ref 150.0–400.0)
RBC: 4.48 Mil/uL (ref 4.22–5.81)
RDW: 13.2 % (ref 11.5–15.5)
WBC: 10.6 10*3/uL — ABNORMAL HIGH (ref 4.0–10.5)

## 2019-05-21 LAB — C-REACTIVE PROTEIN: CRP: <1 mg/dL (ref 0.5–20.0)

## 2019-05-21 LAB — SEDIMENTATION RATE: Sed Rate: 20 mm/hr (ref 0–20)

## 2019-05-23 ENCOUNTER — Other Ambulatory Visit: Payer: BC Managed Care – PPO

## 2019-05-23 DIAGNOSIS — K50919 Crohn's disease, unspecified, with unspecified complications: Secondary | ICD-10-CM

## 2019-05-23 DIAGNOSIS — R197 Diarrhea, unspecified: Secondary | ICD-10-CM | POA: Diagnosis not present

## 2019-05-24 LAB — GASTROINTESTINAL PATHOGEN PANEL PCR
C. difficile Tox A/B, PCR: NOT DETECTED
Campylobacter, PCR: NOT DETECTED
Cryptosporidium, PCR: NOT DETECTED
E coli (ETEC) LT/ST PCR: NOT DETECTED
E coli (STEC) stx1/stx2, PCR: NOT DETECTED
E coli 0157, PCR: NOT DETECTED
Giardia lamblia, PCR: NOT DETECTED
Norovirus, PCR: NOT DETECTED
Rotavirus A, PCR: NOT DETECTED
Salmonella, PCR: NOT DETECTED
Shigella, PCR: NOT DETECTED

## 2019-05-28 ENCOUNTER — Telehealth: Payer: Self-pay | Admitting: Internal Medicine

## 2019-05-28 NOTE — Telephone Encounter (Signed)
See result note.  

## 2019-05-28 NOTE — Telephone Encounter (Signed)
Patient is returning your call regarding labs

## 2019-05-31 ENCOUNTER — Telehealth: Payer: Self-pay | Admitting: Gastroenterology

## 2019-05-31 NOTE — Telephone Encounter (Signed)
Covid-19 screening questions   Do you now or have you had a fever in the last 14 days? No  Do you have any respiratory symptoms of shortness of breath or cough now or in the last 14 days? No  Do you have any family members or close contacts with diagnosed or suspected Covid-19 in the past 14 days? No  Have you been tested for Covid-19 and found to be positive? No        

## 2019-06-01 ENCOUNTER — Other Ambulatory Visit: Payer: Self-pay

## 2019-06-01 ENCOUNTER — Encounter: Payer: Self-pay | Admitting: Internal Medicine

## 2019-06-01 ENCOUNTER — Encounter: Payer: Self-pay | Admitting: Nurse Practitioner

## 2019-06-01 ENCOUNTER — Ambulatory Visit: Payer: BC Managed Care – PPO | Admitting: Nurse Practitioner

## 2019-06-01 VITALS — BP 130/80 | HR 76 | Temp 97.9°F | Ht 65.75 in | Wt 184.0 lb

## 2019-06-01 DIAGNOSIS — K50919 Crohn's disease, unspecified, with unspecified complications: Secondary | ICD-10-CM

## 2019-06-01 DIAGNOSIS — R197 Diarrhea, unspecified: Secondary | ICD-10-CM | POA: Diagnosis not present

## 2019-06-01 MED ORDER — SUPREP BOWEL PREP KIT 17.5-3.13-1.6 GM/177ML PO SOLN: 1.0000 | ORAL | 0 refills | Status: DC

## 2019-06-01 NOTE — Progress Notes (Addendum)
Chief Complaint:    diarrhea  IMPRESSION and PLAN:    63 yo male with hx of Crohn's disease with perianal involvement requiring fistulotomy in 2016, lost to follow up since 2017.   1. Crohn's disease / diarrhea - was on Remicade for about a year until lost to follow up early 2017.  Did okay off treatment until 6 weeks ago. Now here with diarrhea x 6 weeks. GI path panel , labs including CRP unremarkable (though CRP normal in the past). Endorses clear, sometimes yellow drainage. No obvious fistula  / abscess on exam today. -Needs colonoscopy for reassessment of Crohn's disease.   2. PAF, not anticoagulated  Addendum: Reviewed and agree with assessment and management plan. Pyrtle, Lajuan Lines, MD    HPI:     Patient is a 63 year old male with atrial fibrillation, s/p ablation. He was diagnosed with Crohn's disease in 2011 by Dr. Lyndel Safe in [place] . He established care with Dr. Hilarie Fredrickson in 2016, on Lialda at the time. His Crohn's was complicated by perianal abscess / fistula and in March 2016 he underwent a fistulotomy by Dr. Marcello Moores. We subsequently started him on Remicade.  Last colonoscopy with TI intubation Nov 2016 showed active perianal disease , remainder of colon unremarkable and biopsies c/w quiescent colitis. Patient  Continued Remicade up until early 2017 after which time he was lost to follow up.    Despite not being on any therapy for Crohn's disease patient did well until about 6 weeks ago when he developed diarrhea.  Patient called the office early July, we ordered labs and stool studies.  GI pathogen panel was negative, CMET unremarkable, CRP normal, CBC normal save mild leukocytosis.  Patient reports having 3-4 loose stools a day.  He was taking maximum dose of Imodium which has reduced frequency of bowel movements to about twice a day.  He gets llower abdominal discomfort just prior to a bowel movement.  No fevers, no unusual joint aches.  He has not made any recent dietary  changes nor med changes.  He does notice a small amount of discharge in underwear on a regular basis.  The discharges clear, sometimes yellowish. No anorectal pain  Colonoscopy November 2016 ENDOSCOPIC IMPRESSION: 1. The examined terminal ileum appeared to be normal 2. Evidence of prior inflammation seen in the descending and sigmoid colon. There are areas of mild erythema without active ulceration. Multiple pseudopolyps are present in the left colon. Multiple biopsies were obtained throughout the segment to assess disease activity and rule out dysplasia 3. Normal-appearing cecum, ascending colon and transverse colon 4. Pedunculated polyp was found in the rectosigmoid colon; polypectomy was performed using snare cautery 5. Erythema and edema seen on retroflexion at the anal canal consistent with active perianal Crohn's disease RECOMMENDATIONS: 1. Continue Remicade as scheduled 2. Await pathology results 3. Continue Canasa suppository 1 g daily at bedtime 4. Follow-up with me next available and with Dr. Marcello Moores for reassessment of your perianal ulceration 5. Avoid NSAIDs eSigned: Jerene Bears, MD 11/11  Path: inflammatory polyp and quiescent colitis  Review of systems:     No chest pain, no SOB, no fevers, no urinary sx   Past Medical History:  Diagnosis Date   Anal fistula    Anorectal fistula    Atrial fibrillation ([place]) 2016   Benign hypertension    Cataract    Chest pain    Colitis    Contact dermatitis    Crohn's disease ([place]) 2011  Disease of jaw    Dysrhythmia 01/14/15   AFib   Fatigue    Folliculitis    GERD (gastroesophageal reflux disease)    History of Holter monitoring    Will be done on Friday, 01/17/15   Hyperlipidemia    IDA (iron deficiency anemia)    Need for Tdap vaccination    Neoplasm of prostate    Palpitations    Perirectal abscess    Rash    Rectal discharge     Patient's surgical history, family medical history, social  history, medications and allergies were all reviewed in Epic   Serum creatinine: 1.22 mg/dL 05/21/19 1417 Estimated creatinine clearance: 62.5 mL/min  Current Outpatient Medications  Medication Sig Dispense Refill   aspirin 81 MG tablet Take 81 mg by mouth daily.     Bee Pollen 550 MG CAPS Take 2 capsules by mouth daily.     lidocaine (XYLOCAINE) 5 % ointment Apply 1 application topically as needed. 240 g 0   losartan (COZAAR) 25 MG tablet Take 25 mg by mouth. Take one by mouth every other day.     MAGNESIUM PO Take by mouth daily.     pantoprazole (PROTONIX) 40 MG tablet Take 1 tablet (40 mg total) by mouth daily. 90 tablet 3   POTASSIUM PO Take by mouth daily.     No current facility-administered medications for this visit.     Physical Exam:     BP 130/80 (BP Location: Left Arm, Patient Position: Sitting, Cuff Size: Normal)    Pulse 76    Temp 97.9 F (36.6 C)    Ht 5' 5.75" (1.67 m) Comment: height measured without shoes   Wt 184 lb (83.5 kg)    BMI 29.92 kg/m   GENERAL:  Pleasant male in NAD PSYCH: : Cooperative, normal affect EENT:  conjunctiva pink, mucous membranes moist, neck supple without masses CARDIAC:  RRR,  no peripheral edema PULM: Normal respiratory effort, lungs CTA bilaterally, no wheezing ABDOMEN:  Nondistended, soft, nontender. No obvious masses, no hepatomegaly,  normal bowel sounds RECTAL: No fistulas appreciated. Small raised on left side of anus near posterior line. No fluctuance.   SKIN:  turgor, no lesions seen Musculoskeletal:  Normal muscle tone, normal strength NEURO: Alert and oriented x 3, no focal neurologic deficits   Tye Savoy , NP 06/01/2019, 2:47 PM

## 2019-06-01 NOTE — Patient Instructions (Signed)
   If you are age 64 or younger, your body mass index should be between 19-25. Your Body mass index is 29.92 kg/m. If this is out of the aformentioned range listed, please consider follow up with your Primary Care Provider.   You have been scheduled for a colonoscopy. Please follow written instructions given to you at your visit today.  Please pick up your prep supplies at the pharmacy within the next 1-3 days. If you use inhalers (even only as needed), please bring them with you on the day of your procedure.  Thank you for choosing me and [facility].  [place]

## 2019-06-03 NOTE — Patient Instructions (Addendum)
  All other Health Maintenance issues reviewed.   All recommended immunizations and age-appropriate screenings are up-to-date or discussed.  No immunization administered today.   Medications reviewed and updated.  Changes include :   none    Please followup in 12 months

## 2019-06-03 NOTE — Progress Notes (Signed)
Subjective:    Patient ID: Hunter Edwards, male    DOB: [birth year], 63 y.o.   MRN: [id number]  HPI He is here for a physical exam.   For the past 6 weeks he has had a flareup of his Crohn's disease, which he has not had any problem with for years.  He did see GI last week and will be having a colonoscopy on Monday.  He denies any other changes in his history.  Medications and allergies reviewed with patient and updated if appropriate.  Patient Active Problem List   Diagnosis Date Noted  . Rash and nonspecific skin eruption 05/29/2018  . Muscle cramp 04/14/2018  . Essential hypertension 06/11/2015  . Paroxysmal atrial fibrillation ([place]) 05/07/2015  . GERD (gastroesophageal reflux disease) 05/07/2015  . Crohn disease ([place]) 05/07/2015    Current Outpatient Medications on File Prior to Visit  Medication Sig Dispense Refill  . aspirin 81 MG tablet Take 81 mg by mouth daily.    Raelyn Ensign Pollen 550 MG CAPS Take 2 capsules by mouth daily.    Marland Kitchen lidocaine (XYLOCAINE) 5 % ointment Apply 1 application topically as needed. 240 g 0  . losartan (COZAAR) 25 MG tablet Take 25 mg by mouth. Take one by mouth every other day.    Marland Kitchen MAGNESIUM PO Take by mouth daily.    . pantoprazole (PROTONIX) 40 MG tablet Take 1 tablet (40 mg total) by mouth daily. 90 tablet 3  . POTASSIUM PO Take by mouth daily.    Manus Gunning BOWEL PREP KIT 17.5-3.13-1.6 GM/177ML SOLN Take 1 kit by mouth as directed. For colonoscopy prep 354 mL 0   No current facility-administered medications on file prior to visit.     Past Medical History:  Diagnosis Date  . Anal fistula   . Anorectal fistula   . Atrial fibrillation ([place]) 2016  . Benign hypertension   . Cataract   . Chest pain   . Colitis   . Contact dermatitis   . Crohn's disease ([place]) 2011  . Disease of jaw   . Dysrhythmia 01/14/15   AFib  . Fatigue   . Folliculitis   . GERD (gastroesophageal reflux disease)   . History of Holter monitoring    Will be done on  Friday, 01/17/15  . Hyperlipidemia   . IDA (iron deficiency anemia)   . Need for Tdap vaccination   . Neoplasm of prostate   . Palpitations   . Perirectal abscess   . Rash   . Rectal discharge     Past Surgical History:  Procedure Laterality Date  . ANAL FISTULECTOMY N/A 01/24/2015   Procedure: FISTULECTOMY ANAL;  Surgeon: [name], MD;  Location: [facility];  Service: General;  Laterality: N/A;  . RECTAL EXAM UNDER ANESTHESIA N/A 01/24/2015   Procedure: ANAL  EXAM UNDER ANESTHESIA;  Surgeon: [name], MD;  Location: [facility];  Service: General;  Laterality: N/A;  . TONSILLECTOMY  1962    Social History   Socioeconomic History  . Marital status: Married    Spouse name: Not on file  . Number of children: 1  . Years of education: Not on file  . Highest education level: Not on file  Occupational History  . Occupation: [employer] operational officer  . Financial resource strain: Not on file  . Food insecurity    Worry: Not on file    Inability: Not on file  . Transportation needs  Medical: Not on file    Non-medical: Not on file  Tobacco Use  . Smoking status: Former Smoker    Packs/day: 1.00    Types: Cigarettes    Quit date: 11/15/1996    Years since quitting: 22.5  . Smokeless tobacco: Never Used  Substance and Sexual Activity  . Alcohol use: No    Alcohol/week: 0.0 standard drinks    Comment: Rarely  . Drug use: No  . Sexual activity: Not on file  Lifestyle  . Physical activity    Days per week: Not on file    Minutes per session: Not on file  . Stress: Not on file  Relationships  . Social Herbalist on phone: Not on file    Gets together: Not on file    Attends religious service: Not on file    Active member of club or organization: Not on file    Attends meetings of clubs or organizations: Not on file    Relationship status: Not on file  Other Topics Concern  . Not on file  Social History Narrative  .  Not on file    Family History  Problem Relation Age of Onset  . Stomach cancer Father        died in his 75's  . Diabetes Brother   . Heart disease Brother   . Colon cancer Neg Hx   . Colon polyps Neg Hx   . Kidney disease Neg Hx   . Esophageal cancer Neg Hx   . Gallbladder disease Neg Hx     Review of Systems  Constitutional: Negative for chills and fever.  Eyes: Negative for visual disturbance.  Respiratory: Negative for cough, shortness of breath and wheezing.   Cardiovascular: Positive for palpitations (with anxiety only). Negative for chest pain and leg swelling.  Gastrointestinal: Positive for abdominal pain, blood in stool and diarrhea. Negative for constipation and nausea.  Genitourinary: Negative for dysuria and hematuria.  Musculoskeletal: Positive for arthralgias (knees) and back pain.  Skin: Positive for rash (related to Crohns).  Neurological: Positive for light-headedness (occ if he bends over and stands up ). Negative for headaches.  Psychiatric/Behavioral: Negative for dysphoric mood. The patient is not nervous/anxious.        Objective:   Vitals:   06/04/19 1005  BP: 130/80  Pulse: 78  Resp: 15  Temp: 98.1 F (36.7 C)  SpO2: 96%   Filed Weights   06/04/19 1005  Weight: 186 lb (84.4 kg)   Body mass index is 30.25 kg/m.  Wt Readings from Last 3 Encounters:  06/04/19 186 lb (84.4 kg)  06/01/19 184 lb (83.5 kg)  11/25/18 197 lb (89.4 kg)     Physical Exam Constitutional: He appears well-developed and well-nourished. No distress.  HENT:  Head: Normocephalic and atraumatic.  Right Ear: External ear normal.  Left Ear: External ear normal.  Mouth/Throat: Oropharynx is clear and moist.  Normal ear canals and TM b/l  Eyes: Conjunctivae and EOM are normal.  Neck: Neck supple. No tracheal deviation present. No thyromegaly present.  No carotid bruit  Cardiovascular: Normal rate, regular rhythm, normal heart sounds and intact distal pulses.   No  murmur heard. Pulmonary/Chest: Effort normal and breath sounds normal. No respiratory distress. He has no wheezes. He has no rales.  Abdominal: Soft. He exhibits no distension. There is no tenderness.  Genitourinary: deferred  Musculoskeletal: He exhibits no edema.  Lymphadenopathy:   He has no cervical adenopathy.  Skin: Skin  is warm and dry. He is not diaphoretic.  Rash on arms-related to Crohn's Psychiatric: He has a normal mood and affect. His behavior is normal.         Assessment & Plan:   Physical exam: Screening blood work   deferred-he just had blood work done recently by GI Immunizations   Discussed shingrix, other up to date Colonoscopy   Up to date-will be having one in 1 week due to flareup of Crohn's Eye exams   Up to date  Exercise    No regular exercise Weight  Encouraged weight loss Skin  Rash - from Crohn's  Substance abuse   none  See Problem List for Assessment and Plan of chronic medical problems.   FU in one year, sooner if needed

## 2019-06-04 ENCOUNTER — Encounter: Payer: Self-pay | Admitting: Internal Medicine

## 2019-06-04 ENCOUNTER — Other Ambulatory Visit: Payer: Self-pay

## 2019-06-04 ENCOUNTER — Ambulatory Visit (INDEPENDENT_AMBULATORY_CARE_PROVIDER_SITE_OTHER): Payer: BC Managed Care – PPO | Admitting: Internal Medicine

## 2019-06-04 VITALS — BP 130/80 | HR 78 | Temp 98.1°F | Resp 15 | Ht 65.75 in | Wt 186.0 lb

## 2019-06-04 DIAGNOSIS — Z0000 Encounter for general adult medical examination without abnormal findings: Secondary | ICD-10-CM

## 2019-06-04 DIAGNOSIS — I480 Paroxysmal atrial fibrillation: Secondary | ICD-10-CM

## 2019-06-04 DIAGNOSIS — K50919 Crohn's disease, unspecified, with unspecified complications: Secondary | ICD-10-CM | POA: Diagnosis not present

## 2019-06-04 DIAGNOSIS — K219 Gastro-esophageal reflux disease without esophagitis: Secondary | ICD-10-CM

## 2019-06-04 DIAGNOSIS — I10 Essential (primary) hypertension: Secondary | ICD-10-CM

## 2019-06-04 NOTE — Assessment & Plan Note (Signed)
Following with cardiology at [facility] Paroxysmal Had ablation 2017 Up-to-date with cardiology visits

## 2019-06-04 NOTE — Assessment & Plan Note (Addendum)
Not taking his medication since Crohn's flared up He will continue to monitor blood pressure at home and restart medication if his blood pressure elevates

## 2019-06-04 NOTE — Assessment & Plan Note (Signed)
Flare for the past 6 weeks or so Saw GI  To have colonoscopy in one week Taking imodium now

## 2019-06-04 NOTE — Assessment & Plan Note (Signed)
GERD controlled Continue daily medication  

## 2019-06-08 ENCOUNTER — Telehealth: Payer: Self-pay | Admitting: Internal Medicine

## 2019-06-08 NOTE — Telephone Encounter (Signed)
Spoke with patient regarding Covid-19 screening questions. Covid-19 Screening Questions:  Do you now or have you had a fever in the last 14 days? no  Do you have any respiratory symptoms of shortness of breath or cough now or in the last 14 days? no  Do you have any family members or close contacts with diagnosed or suspected Covid-19 in the past 14 days? no  Have you been tested for Covid-19 and found to be positive? no  Pt made aware of that care partner may wait in the car or come up to the lobby during the procedure but will need to provide their own mask.

## 2019-06-11 ENCOUNTER — Encounter: Payer: Self-pay | Admitting: Internal Medicine

## 2019-06-11 ENCOUNTER — Other Ambulatory Visit: Payer: Self-pay

## 2019-06-11 ENCOUNTER — Ambulatory Visit (AMBULATORY_SURGERY_CENTER): Payer: BC Managed Care – PPO | Admitting: Internal Medicine

## 2019-06-11 ENCOUNTER — Telehealth: Payer: Self-pay

## 2019-06-11 ENCOUNTER — Other Ambulatory Visit: Payer: BC Managed Care – PPO

## 2019-06-11 VITALS — BP 107/68 | HR 72 | Temp 99.0°F | Resp 19 | Ht 65.0 in | Wt 184.0 lb

## 2019-06-11 DIAGNOSIS — K5090 Crohn's disease, unspecified, without complications: Secondary | ICD-10-CM | POA: Diagnosis not present

## 2019-06-11 DIAGNOSIS — D120 Benign neoplasm of cecum: Secondary | ICD-10-CM

## 2019-06-11 DIAGNOSIS — Z789 Other specified health status: Secondary | ICD-10-CM | POA: Diagnosis not present

## 2019-06-11 DIAGNOSIS — K5190 Ulcerative colitis, unspecified, without complications: Secondary | ICD-10-CM | POA: Diagnosis not present

## 2019-06-11 DIAGNOSIS — Z79899 Other long term (current) drug therapy: Secondary | ICD-10-CM | POA: Diagnosis not present

## 2019-06-11 DIAGNOSIS — K50919 Crohn's disease, unspecified, with unspecified complications: Secondary | ICD-10-CM

## 2019-06-11 DIAGNOSIS — K529 Noninfective gastroenteritis and colitis, unspecified: Secondary | ICD-10-CM | POA: Diagnosis not present

## 2019-06-11 MED ORDER — SODIUM CHLORIDE 0.9 % IV SOLN: 500.0000 mL | Freq: Once | INTRAVENOUS | Status: DC

## 2019-06-11 NOTE — Progress Notes (Signed)
Pt's states no medical or surgical changes since previsit or office visit.  JM temps and CW vitals

## 2019-06-11 NOTE — Progress Notes (Signed)
To PACU, VSS. Report to [email or web address] 

## 2019-06-11 NOTE — Telephone Encounter (Signed)
-----   Message from Jerene Bears, MD sent at 06/11/2019  3:05 PM EDT ----- Can you check benefits for this patient to do our office Cimzia (new start) Crohn's colitis + perianal

## 2019-06-11 NOTE — Patient Instructions (Signed)
One polyp removed.  YOU HAD AN ENDOSCOPIC PROCEDURE TODAY AT [facility]:   Refer to the procedure report that was given to you for any specific questions about what was found during the examination.  If the procedure report does not answer your questions, please call your gastroenterologist to clarify.  If you requested that your care partner not be given the details of your procedure findings, then the procedure report has been included in a sealed envelope for you to review at your convenience later.  YOU SHOULD EXPECT: Some feelings of bloating in the abdomen. Passage of more gas than usual.  Walking can help get rid of the air that was put into your GI tract during the procedure and reduce the bloating. If you had a lower endoscopy (such as a colonoscopy or flexible sigmoidoscopy) you may notice spotting of blood in your stool or on the toilet paper. If you underwent a bowel prep for your procedure, you may not have a normal bowel movement for a few days.  Please Note:  You might notice some irritation and congestion in your nose or some drainage.  This is from the oxygen used during your procedure.  There is no need for concern and it should clear up in a day or so.  SYMPTOMS TO REPORT IMMEDIATELY:   Following lower endoscopy (colonoscopy or flexible sigmoidoscopy):  Excessive amounts of blood in the stool  Significant tenderness or worsening of abdominal pains  Swelling of the abdomen that is new, acute  Fever of 100F or higher   For urgent or emergent issues, a gastroenterologist can be reached at any hour by calling [phone].   DIET:  We do recommend a small meal at first, but then you may proceed to your regular diet.  Drink plenty of fluids but you should avoid alcoholic beverages for 24 hours.  ACTIVITY:  You should plan to take it easy for the rest of today and you should NOT DRIVE or use heavy machinery until tomorrow (because of the sedation medicines  used during the test).    FOLLOW UP: Our staff will call the number listed on your records 48-72 hours following your procedure to check on you and address any questions or concerns that you may have regarding the information given to you following your procedure. If we do not reach you, we will leave a message.  We will attempt to reach you two times.  During this call, we will ask if you have developed any symptoms of COVID 19. If you develop any symptoms (ie: fever, flu-like symptoms, shortness of breath, cough etc.) before then, please call [phone].  If you test positive for Covid 19 in the 2 weeks post procedure, please call and report this information to [place].    If any biopsies were taken you will be contacted by phone or by letter within the next 1-3 weeks.  Please call us at [phone] if you have not heard about the biopsies in 3 weeks.    SIGNATURES/CONFIDENTIALITY: You and/or your care partner have signed paperwork which will be entered into your electronic medical record.  These signatures attest to the fact that that the information above on your After Visit Summary has been reviewed and is understood.  Full responsibility of the confidentiality of this discharge information lies with you and/or your care-partner.

## 2019-06-11 NOTE — Telephone Encounter (Signed)
Form initiated and placed in Dr. Vena Rua office for signature.

## 2019-06-11 NOTE — Op Note (Signed)
[facility] Patient Name: Hunter Edwards Procedure Date: 06/11/2019 2:13 PM MRN: [id number] Endoscopist: Jerene Bears , MD Age: 63 Referring MD:  Date of Birth: 04-05-1956 Gender: Male Account #: [id number] Procedure:                Colonoscopy Indications:              Disease activity assessment of Crohn's disease of                            the colon + perianal Crohn's disease; last                            colonoscopy 2016, previously treated with                            infliximab with last dose July 2017 Medicines:                Monitored Anesthesia Care Procedure:                Pre-Anesthesia Assessment:                           - Prior to the procedure, a History and Physical                            was performed, and patient medications and                            allergies were reviewed. The patient's tolerance of                            previous anesthesia was also reviewed. The risks                            and benefits of the procedure and the sedation                            options and risks were discussed with the patient.                            All questions were answered, and informed consent                            was obtained. Prior Anticoagulants: The patient has                            taken no previous anticoagulant or antiplatelet                            agents. ASA Grade Assessment: II - A patient with                            mild systemic disease. After reviewing the risks  and benefits, the patient was deemed in                            satisfactory condition to undergo the procedure.                           After obtaining informed consent, the colonoscope                            was passed under direct vision. Throughout the                            procedure, the patient's blood pressure, pulse, and                            oxygen saturations were monitored  continuously. The                            Colonoscope was introduced through the anus and                            advanced to the terminal ileum. The colonoscopy was                            performed without difficulty. The patient tolerated                            the procedure well. The quality of the bowel                            preparation was good. The terminal ileum, ileocecal                            valve, appendiceal orifice, and rectum were                            photographed. 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 Skin tags are                            present.                           The terminal ileum appeared normal.                           A 2 mm polyp was found in the cecum. The polyp was  sessile. The polyp was removed with a cold snare.                            Resection and retrieval were complete.                           Inflammation characterized by congestion (edema),                            erosions, erythema, friability, granularity, loss                            of vascularity and deep ulcerations was found in a                            continuous and circumferential pattern from the                            rectum to the descending colon. The ascending colon                            and the cecum were spared. This was severe, and                            when compared to previous examinations, the                            findings are worsened. Biopsies were taken with a                            cold forceps for histology.                           Scattered pseudopolyps were found in the transverse                            colon. There was  no overtly evidence active                            inflammation in this segment. This was biopsied                            with a cold forceps for histology.                           Retroflexion in the rectum was not performed due to                            severity of active Crohn's disease. Complications:            No immediate complications. Estimated Blood Loss:     Estimated blood loss was minimal. Impression:               - The examined portion of the ileum was normal.                           -  One 2 mm polyp in the cecum, removed with a cold                            snare. Resected and retrieved.                           - Crohn's disease with colonic involvement.                            Inflammation was found from the rectum to the                            descending colon. This was severe, worsened                            compared to previous examinations. Biopsied.                           - Pseudopolyps in the transverse colon. Biopsied. Recommendation:           - Patient has a contact number available for                            emergencies. The signs and symptoms of potential                            delayed complications were discussed with the                            patient. Return to normal activities tomorrow.                            Written discharge instructions were provided to the                            patient.                           - Resume previous diet.                           - Continue present medications.                           - I recommend we restart Crohn's therapy with                            anti-TNF medication (either infliximab or                            adalimumab).                           - Await pathology results.                           - Repeat  colonoscopy is recommended. The                            colonoscopy date will be determined after pathology                             results from today's exam become available for                            review. Jerene Bears, MD 06/11/2019 2:50:55 PM This report has been signed electronically.

## 2019-06-11 NOTE — Progress Notes (Signed)
Called to room to assist during endoscopic procedure.  Patient ID and intended procedure confirmed with present staff. Received instructions for my participation in the procedure from the performing physician.  

## 2019-06-12 NOTE — Telephone Encounter (Signed)
Form faxed to [employer] for benefits investigation.

## 2019-06-13 ENCOUNTER — Telehealth: Payer: Self-pay | Admitting: *Deleted

## 2019-06-13 LAB — QUANTIFERON-TB GOLD PLUS
Mitogen-NIL: >10 IU/mL
NIL: 0.05 IU/mL
QuantiFERON-TB Gold Plus: NEGATIVE
TB1-NIL: 0.02 IU/mL
TB2-NIL: 0.01 IU/mL

## 2019-06-13 LAB — HEPATITIS B SURFACE ANTIGEN: Hepatitis B Surface Ag: NONREACTIVE

## 2019-06-13 LAB — HEPATITIS B SURFACE ANTIBODY,QUALITATIVE: Hep B S Ab: NONREACTIVE

## 2019-06-13 LAB — HEPATITIS B CORE ANTIBODY, TOTAL: Hep B Core Total Ab: NONREACTIVE

## 2019-06-13 LAB — HEPATITIS C ANTIBODY
Hepatitis C Ab: NONREACTIVE
SIGNAL TO CUT-OFF: 0.01 (ref <1.00)

## 2019-06-13 NOTE — Telephone Encounter (Signed)
  Follow up Call-  Call back number 06/11/2019  Post procedure Call Back phone  # [phone]  Permission to leave phone message Yes  Some recent data might be hidden     Patient questions:  Do you have a fever, pain , or abdominal swelling? No. Pain Score  0 *  Have you tolerated food without any problems? Yes.    Have you been able to return to your normal activities? Yes.    Do you have any questions about your discharge instructions: Diet   No. Medications  No. Follow up visit  No.  Do you have questions or concerns about your Care? No.  Actions: * If pain score is 4 or above: No action needed, pain <4.  1. Have you developed a fever since your procedure? NO  2.   Have you had an respiratory symptoms (SOB or cough) since your procedure? NO  3.   Have you tested positive for COVID 19 since your procedure NO  4.   Have you had any family members/close contacts diagnosed with the COVID 19 since your procedure?  NO   If yes to any of these questions please route to Joylene John, RN and Alphonsa Gin, RN.

## 2019-06-15 NOTE — Telephone Encounter (Signed)
I spoke with [employer] representative for pre-determination for JCode [id number].  They require clinical notes faxed to [phone]. Case number [id number].  Response can take 15 calendar days.

## 2019-06-16 ENCOUNTER — Encounter: Payer: Self-pay | Admitting: Internal Medicine

## 2019-06-16 NOTE — Telephone Encounter (Signed)
Ok, he has active colitis and we need to start treatment ASAP. If possible, please contact the [employer] pharmaceutical representative to see if process can be expedited. Trying to avoid steroid use, which will be possible if anti-TNF therapy can be started soon. Please update patient on where we are in the process.  Prednisone can be offered if he is interested (if so, then 40 mg once daily x 7 days, decrease by 5 mg every 7 days until off -- 8 week taper)

## 2019-06-18 ENCOUNTER — Other Ambulatory Visit: Payer: Self-pay

## 2019-06-18 MED ORDER — PREDNISONE 10 MG PO TABS: ORAL_TABLET | ORAL | 1 refills | Status: DC

## 2019-06-18 NOTE — Addendum Note (Signed)
Addended by: Rosanne Sack R on: 06/18/2019 04:22 PM   Modules accepted: Orders

## 2019-06-18 NOTE — Telephone Encounter (Signed)
Pt aware, script for prednisone sent to pharmacy.

## 2019-06-18 NOTE — Telephone Encounter (Signed)
This is not [employer] delaying, but [employer].

## 2019-06-20 ENCOUNTER — Ambulatory Visit (INDEPENDENT_AMBULATORY_CARE_PROVIDER_SITE_OTHER): Payer: BC Managed Care – PPO | Admitting: Internal Medicine

## 2019-06-20 DIAGNOSIS — Z23 Encounter for immunization: Secondary | ICD-10-CM | POA: Diagnosis not present

## 2019-06-20 NOTE — Telephone Encounter (Signed)
Note sent to cardiology via epic to Dr. Estevan Ryder.

## 2019-06-20 NOTE — Telephone Encounter (Signed)
Received approval from [employer] for the in office [employer] Referral number [id number] approved from 06/15/19-11/15/19. Dr. Hilarie Fredrickson is out of network according to the approval.  Patient is notified that this is out of network and he will check with his insurance and call back when he has talked to them.

## 2019-06-20 NOTE — Telephone Encounter (Signed)
Okay, once patient determines if our practice is in network and affordable for him, we can proceed with an office Cimzia with standard induction followed by standard dosing every 28 days  As far as his question regarding cardiology, I did not touch base with his cardiologist regarding C[employer].  I was not aware I was supposed to do this.  Cimzia as with other antitumor necrosis factor alpha medications like Remicade, which he was on previously, usually do not have significant cardiac related complications.  There is a very rare risk of congestive heart failure which can occur.  Please forward this note by EMR or fax to his cardiologist, which I believe is in [facility] to let them know that we are initiating Cim[employer] and requesting their opinion regarding this medication in light of his cardiac history

## 2019-06-20 NOTE — Telephone Encounter (Signed)
Pt came in for Hep B vaccine today and he wants to make sure that Dr. Hilarie Fredrickson has touched base with cardiology to make sure he is ok to take the [place]. Please advise.

## 2019-06-28 MED ORDER — CIMZIA STARTER KIT 6 X 200 MG/ML ~~LOC~~ KIT: PACK | SUBCUTANEOUS | 0 refills | Status: DC

## 2019-06-28 MED ORDER — CIMZIA PREFILLED 2 X 200 MG/ML ~~LOC~~ KIT: PACK | SUBCUTANEOUS | 6 refills | Status: DC

## 2019-06-28 NOTE — Telephone Encounter (Signed)
Pt is planning on the cimzia, script has been sent in already.

## 2019-06-28 NOTE — Telephone Encounter (Signed)
Extensive back and fourth communication with the patient's insurance company and [employer].  [employer] "may be covered at an out of network expense" depending on how the billing goes out.  If billed under [employer] [facility] will be out of network, if billed from [facility] will be in network.  Billing is not sure how the insurance will accept the claim and patient could have significant out of pocket expense doing in office injections.  He has agreed to try and do the home self injections.  He will go on to the [employer] site to obtain the free co-pay card and provide that to [facility] when they call him.  He wants Dr. Hilarie Fredrickson to know he is doing better on the prednisone.  He understands to call back for any additional questions or concerns.

## 2019-06-28 NOTE — Telephone Encounter (Signed)
ok 

## 2019-06-28 NOTE — Telephone Encounter (Signed)
Sheri, Humira would also be an option if he is going to be self-injecting. This comes in a auto-inejction pen and may be easier? I know much work has been done and thus, either Cim[employer] or Humira can be used with standard induction and dosing

## 2019-06-28 NOTE — Addendum Note (Signed)
Addended by: Marlon Pel on: 06/28/2019 11:21 AM   Modules accepted: Orders

## 2019-07-04 ENCOUNTER — Telehealth: Payer: Self-pay

## 2019-07-04 NOTE — Telephone Encounter (Signed)
Authorization process started for Humira for the pt, he is aware and ambassador form faxed.

## 2019-07-04 NOTE — Telephone Encounter (Signed)
-----   Message from Jerene Bears, MD sent at 07/04/2019 12:28 PM EDT ----- Yes, okay for Humira as long as patient is willing to self inject ----- Message ----- From: Algernon Huxley, RN Sent: 07/04/2019  10:06 AM EDT To: Jerene Bears, MD  Received call from [place]. The [employer] was given a prior determination based on pts medical benefits being given in the office. Now we are having to use his pharmacy benefits and Cimzia is not preferred, Humira is and [employer] with require a prior auth.  Can pt have Humira now since it  is preferred and will go through much quicker with his insurance. Please let me know.  Thanks, [employer]

## 2019-07-09 ENCOUNTER — Other Ambulatory Visit: Payer: Self-pay

## 2019-07-09 MED ORDER — HUMIRA-CD/UC/HS STARTER 80 MG/0.8ML ~~LOC~~ AJKT: AUTO-INJECTOR | SUBCUTANEOUS | 0 refills | Status: DC

## 2019-07-09 MED ORDER — HUMIRA (2 PEN) 40 MG/0.4ML ~~LOC~~ AJKT: 40.0000 mg | AUTO-INJECTOR | SUBCUTANEOUS | 11 refills | Status: DC

## 2019-07-24 ENCOUNTER — Other Ambulatory Visit: Payer: Self-pay

## 2019-07-24 ENCOUNTER — Ambulatory Visit (INDEPENDENT_AMBULATORY_CARE_PROVIDER_SITE_OTHER): Payer: BC Managed Care – PPO | Admitting: Internal Medicine

## 2019-07-24 DIAGNOSIS — Z23 Encounter for immunization: Secondary | ICD-10-CM

## 2019-07-24 NOTE — Progress Notes (Signed)
Administered pt's 2nd of two Heplisav-B injections. VIS given to pt. He tolerated the injection well.

## 2019-10-14 ENCOUNTER — Encounter: Payer: Self-pay | Admitting: Internal Medicine

## 2019-10-14 MED ORDER — PANTOPRAZOLE SODIUM 40 MG PO TBEC: 40.0000 mg | DELAYED_RELEASE_TABLET | Freq: Every day | ORAL | 3 refills | Status: DC

## 2019-10-15 NOTE — Telephone Encounter (Signed)
Dr Hilarie Fredrickson please confirm that it is ok for the pt to have a flu shot while on humira.

## 2019-10-15 NOTE — Telephone Encounter (Signed)
Certainly okay to have flu vaccine while he is on Humira And flu vaccine is recommended

## 2019-10-23 ENCOUNTER — Other Ambulatory Visit: Payer: Self-pay

## 2019-10-23 DIAGNOSIS — Z20822 Contact with and (suspected) exposure to covid-19: Secondary | ICD-10-CM

## 2019-10-25 LAB — NOVEL CORONAVIRUS, NAA: SARS-CoV-2, NAA: NOT DETECTED

## 2019-11-25 ENCOUNTER — Emergency Department (HOSPITAL_COMMUNITY)
Admission: EM | Admit: 2019-11-25 | Discharge: 2019-11-25 | Disposition: A | Discharge status: Home / Self Care | Payer: BC Managed Care – PPO | Attending: Emergency Medicine | Admitting: Emergency Medicine

## 2019-11-25 ENCOUNTER — Encounter (HOSPITAL_COMMUNITY): Payer: Self-pay | Admitting: Emergency Medicine

## 2019-11-25 ENCOUNTER — Other Ambulatory Visit: Payer: Self-pay

## 2019-11-25 ENCOUNTER — Emergency Department (HOSPITAL_COMMUNITY): Payer: BC Managed Care – PPO

## 2019-11-25 DIAGNOSIS — R079 Chest pain, unspecified: Secondary | ICD-10-CM | POA: Diagnosis not present

## 2019-11-25 DIAGNOSIS — I10 Essential (primary) hypertension: Secondary | ICD-10-CM | POA: Diagnosis not present

## 2019-11-25 DIAGNOSIS — K219 Gastro-esophageal reflux disease without esophagitis: Secondary | ICD-10-CM | POA: Insufficient documentation

## 2019-11-25 DIAGNOSIS — R0789 Other chest pain: Secondary | ICD-10-CM | POA: Diagnosis not present

## 2019-11-25 LAB — CBC
HCT: 42.6 % (ref 39.0–52.0)
Hemoglobin: 13.9 g/dL (ref 13.0–17.0)
MCH: 27.7 pg (ref 26.0–34.0)
MCHC: 32.6 g/dL (ref 30.0–36.0)
MCV: 85 fL (ref 80.0–100.0)
Platelets: 288 10*3/uL (ref 150–400)
RBC: 5.01 MIL/uL (ref 4.22–5.81)
RDW: 14.5 % (ref 11.5–15.5)
WBC: 10.7 10*3/uL — ABNORMAL HIGH (ref 4.0–10.5)
nRBC: 0 % (ref 0.0–0.2)

## 2019-11-25 LAB — BASIC METABOLIC PANEL
Anion gap: 8 (ref 5–15)
BUN: 12 mg/dL (ref 8–23)
CO2: 27 mmol/L (ref 22–32)
Calcium: 9.1 mg/dL (ref 8.9–10.3)
Chloride: 100 mmol/L (ref 98–111)
Creatinine, Ser: 1.21 mg/dL (ref 0.61–1.24)
GFR calc Af Amer: >60 mL/min (ref >60)
GFR calc non Af Amer: >60 mL/min (ref >60)
Glucose, Bld: 110 mg/dL — ABNORMAL HIGH (ref 70–99)
Potassium: 3.8 mmol/L (ref 3.5–5.1)
Sodium: 135 mmol/L (ref 135–145)

## 2019-11-25 LAB — TROPONIN I (HIGH SENSITIVITY)
Troponin I (High Sensitivity): 3 ng/L (ref <18)
Troponin I (High Sensitivity): 2 ng/L (ref <18)

## 2019-11-25 NOTE — ED Provider Notes (Signed)
Care transferred from [place], [place] at shift change. See note for full HPI.  In summation, 64 year old male presents for evaluation of chest pain.  Patient with right-sided chest pain that began yesterday evening.  Was relieved with Maalox.  Pain returned after he ate his breakfast this morning.  Pain is constant in nature.  Pain does not radiate, is nonexertional nonpleuritic.  No associated diaphoresis, lightheadedness, dizziness, unilateral leg swelling or syncope.  Patient followed by cardiology at [facility].  Per previous provider patient with atypical ACS symptoms.,  Nonexertional without associated shortness of breath, diaphoresis, nausea or vomiting.  Low suspicion for emergent cardiac pathology.  EKG is reassuring.  Patient with initial troponin that was negative.  Patient with heart score of 2, Wells criteria low risk.  Plan to obtain delta troponin.  If negative patient may follow-up outpatient with cardiologist and PCP provider. Physical Exam  BP (!) 144/89   Pulse 72   Temp 97.6 F (36.4 C) (Oral)   Resp 17   Ht 5' 6"  (1.676 m)   Wt 83 kg   SpO2 100%   BMI 29.54 kg/m   Physical Exam Vitals and nursing note reviewed.  Constitutional:      General: He is not in acute distress.    Appearance: He is not ill-appearing, toxic-appearing or diaphoretic.  HENT:     Head: Normocephalic and atraumatic.     Jaw: There is normal jaw occlusion.     Nose: Nose normal.  Eyes:     Extraocular Movements: Extraocular movements intact.  Neck:     Vascular: No carotid bruit or JVD.     Trachea: Trachea and phonation normal.     Meningeal: Brudzinski's sign and Kernig's sign absent.  Cardiovascular:     Rate and Rhythm: Normal rate.     Pulses: Normal pulses.          Radial pulses are 2+ on the right side and 2+ on the left side.       Posterior tibial pulses are 2+ on the right side and 2+ on the left side.     Heart sounds: Normal heart sounds.  Pulmonary:   Effort: Pulmonary effort is normal.     Breath sounds: Normal breath sounds and air entry.  Chest:     Chest wall: No deformity, swelling, tenderness, crepitus or edema.  Musculoskeletal:     Cervical back: Full passive range of motion without pain, normal range of motion and neck supple.     Right lower leg: No edema.     Left lower leg: No edema.  Feet:     Right foot:     Skin integrity: Skin integrity normal.     Left foot:     Skin integrity: Skin integrity normal.  Skin:    General: Skin is warm.     Capillary Refill: Capillary refill takes less than 2 seconds.  Neurological:     General: No focal deficit present.     Mental Status: He is alert and oriented to person, place, and time.     ED Course/Procedures     Procedures Labs Reviewed  CBC - Abnormal; Notable for the following components:      Result Value   WBC 10.7 (*)    All other components within normal limits  BASIC METABOLIC PANEL - Abnormal; Notable for the following components:   Glucose, Bld 110 (*)    All other components within normal limits  TROPONIN  I (HIGH SENSITIVITY)  TROPONIN I (HIGH SENSITIVITY)  DG Chest 2 View  Result Date: 11/25/2019 CLINICAL DATA:  Chest pain EXAM: CHEST - 2 VIEW COMPARISON:  2017 FINDINGS: The heart size and mediastinal contours are within normal limits. Both lungs are clear. No pleural effusion or pneumothorax. The visualized skeletal structures are unremarkable. IMPRESSION: No acute process in the chest. Electronically Signed   By: Macy Mis M.D.   On: 11/25/2019 14:28   MDM  64 year old presents for evaluation of chest pain.  Chest pain seems atypical for ACS, not pleuritic, exertional, no associated shortness of breath, diaphoresis, lightheadedness, nausea or vomiting.  Heart score 2, Wells criteria low risk.  EKG reassuring as well as chest x-ray and additional CBC and BMP.  No current chest pain.  Plan for delta troponin.  If negative patient may follow-up  outpatient with his cardiologist.  Patient's delta troponin is negative.  He continues to remain chest pain-free.  He has been hypertensive here in the emergency department however I have low suspicion for dissection as cause of his pain.  He has equal pulses bilaterally without any neurologic complaints.   Patient is to be discharged with recommendation to follow up with PCP in regards to today's hospital visit. Chest pain is not likely of cardiac or pulmonary etiology d/t presentation, PERC negative, VSS, no tracheal deviation, no JVD or new murmur, RRR, breath sounds equal bilaterally, EKG without acute abnormalities, negative troponin, and negative CXR. Pt has been advised to return to the ED if CP becomes exertional, associated with diaphoresis or nausea, radiates to left jaw/arm, worsens or becomes concerning in any way. Pt appears reliable for follow up and is agreeable to discharge.   The patient has been appropriately medically screened and/or stabilized in the ED. I have low suspicion for any other emergent medical condition which would require further screening, evaluation or treatment in the ED or require inpatient management.        Nettie Elm, PA-C 11/25/19 1651    Milton Ferguson, MD 11/25/19 1956

## 2019-11-25 NOTE — ED Provider Notes (Signed)
[facility] Provider Note   CSN: [id number] Arrival date & time: 11/25/19  1320     History Chief Complaint  Patient presents with  . Chest Pain    Hunter Edwards is a 64 y.o. male.   64 y/o male with PMH of Afib (s/p ablation), Crohn's disease, GERD presents to the emergency department for evaluation of chest pain.  He describes his chest pain to be just to the right of his sternum.  It was present last night, but was relieved with Maalox.  He has been compliant with his PPI as well.  Notes return of the pain this morning after eating breakfast.  It has remained constant and is described as a pressure.  Pain does not radiate and is nonexertional, not pleuritic.  He has not had any associated diaphoresis, nausea, leg swelling, syncope or near syncope, cough.  Reports some shortness of breath at rest and dyspnea on exertion at baseline; does not specifically note these to be worse since onset of his discomfort.  No medications taken today for symptoms.  Does note some pain to the right side of his neck and posterior shoulder, but these have been ongoing and began "since it got cold".  Patient also noting low back pain which is chronic, intermittent, unchanged from baseline.  No extremity numbness, paresthesias, weakness.  Patient quit smoking 35 years ago. No hx of ACS or VTE.  Is followed by Cardiology at [facility].   Chest Pain      Past Medical History:  Diagnosis Date  . Anal fistula   . Anorectal fistula   . Atrial fibrillation ([place]) 2016  . Benign hypertension   . Cataract   . Chest pain   . Colitis   . Contact dermatitis   . Crohn's disease ([place]) 2011  . Disease of jaw   . Dysrhythmia 01/14/15   AFib  . Fatigue   . Folliculitis   . GERD (gastroesophageal reflux disease)   . History of Holter monitoring    Will be done on Friday, 01/17/15  . Hyperlipidemia   . IDA (iron deficiency anemia)   . Need for Tdap vaccination   . Neoplasm of prostate   .  Palpitations   . Perirectal abscess   . Rash   . Rectal discharge     Patient Active Problem List   Diagnosis Date Noted  . Rash and nonspecific skin eruption 05/29/2018  . Muscle cramp 04/14/2018  . Essential hypertension 06/11/2015  . Paroxysmal atrial fibrillation ([place]) 05/07/2015  . GERD (gastroesophageal reflux disease) 05/07/2015  . Crohn disease ([place]) 05/07/2015    Past Surgical History:  Procedure Laterality Date  . ANAL FISTULECTOMY N/A 01/24/2015   Procedure: FISTULECTOMY ANAL;  Surgeon: [name], MD;  Location: [facility];  Service: General;  Laterality: N/A;  . RECTAL EXAM UNDER ANESTHESIA N/A 01/24/2015   Procedure: ANAL  EXAM UNDER ANESTHESIA;  Surgeon: [name], MD;  Location: [facility];  Service: General;  Laterality: N/A;  . TONSILLECTOMY  1962       Family History  Problem Relation Age of Onset  . Stomach cancer Father        died in his 42's  . Diabetes Brother   . Heart disease Brother   . Colon cancer Neg Hx   . Colon polyps Neg Hx   . Kidney disease Neg Hx   . Esophageal cancer Neg Hx   . Gallbladder disease Neg Hx  Social History   Tobacco Use  . Smoking status: Former Smoker    Packs/day: 1.00    Types: Cigarettes    Quit date: 11/15/1996    Years since quitting: 23.0  . Smokeless tobacco: Never Used  Substance Use Topics  . Alcohol use: No    Alcohol/week: 0.0 standard drinks    Comment: Rarely  . Drug use: No    Home Medications Prior to Admission medications   Medication Sig Start Date End Date Taking? Authorizing Provider  Adalimumab (HUMIRA PEN) 40 MG/0.4ML PNKT Inject 40 mg into the skin every 14 (fourteen) days. 07/09/19  Yes Pyrtle, Lajuan Lines, MD  aspirin 81 MG tablet Take 81 mg by mouth daily.   Yes [provider]  losartan (COZAAR) 25 MG tablet Take 25 mg by mouth. Take one by mouth every other day.   Yes [provider]  MAGNESIUM PO Take by mouth daily.   Yes  [provider]  POTASSIUM PO Take by mouth daily.   Yes [provider]  Adalimumab (HUMIRA PEN-CD/UC/HS STARTER) 80 MG/0.8ML PNKT Inject 160 mg into the skin as directed AND 80 mg as directed. Inject 139m into skin on day 1 then inject 874minto skin day 15. 07/09/19   Pyrtle, JaLajuan LinesMD  Bee Pollen 550 MG CAPS Take 2 capsules by mouth daily.    [provider]  lidocaine (XYLOCAINE) 5 % ointment Apply 1 application topically as needed. 05/29/18   BuBinnie RailMD  pantoprazole (PROTONIX) 40 MG tablet Take 1 tablet (40 mg total) by mouth daily. 10/14/19   BuBinnie RailMD  predniSONE (DELTASONE) 10 MG tablet Take 4 tabs by mouth for 7 days then decrease by 83m31mvery 7 days until off med 06/18/19   Pyrtle, JayLajuan LinesD    Allergies    Statins, Sulfa antibiotics, and Sulfamethoxazole  Review of Systems   Review of Systems  Cardiovascular: Positive for chest pain.  Ten systems reviewed and are negative for acute change, except as noted in the HPI.    Physical Exam Updated Vital Signs BP (!) 151/93   Pulse 76   Temp 97.6 F (36.4 C) (Oral)   Resp 14   Ht 5' 6"  (1.676 m)   Wt 83 kg   SpO2 100%   BMI 29.54 kg/m   Physical Exam Vitals and nursing note reviewed.  Constitutional:      General: He is not in acute distress.    Appearance: He is well-developed. He is not diaphoretic.     Comments: Nontoxic appearing, pleasant.  HENT:     Head: Normocephalic and atraumatic.  Eyes:     General: No scleral icterus.    Conjunctiva/sclera: Conjunctivae normal.  Neck:     Comments: No carotid bruits b/l Cardiovascular:     Rate and Rhythm: Normal rate and regular rhythm.     Pulses: Normal pulses.  Pulmonary:     Effort: Pulmonary effort is normal. No respiratory distress.     Breath sounds: No stridor. No wheezing, rhonchi or rales.     Comments: Lungs CTAB. Respirations even and unlabored. Musculoskeletal:        General: Normal range of motion.      Cervical back: Normal range of motion.     Right lower leg: No edema.     Left lower leg: No edema.  Skin:    General: Skin is warm and dry.     Coloration: Skin is not  pale.     Findings: No erythema or rash.  Neurological:     General: No focal deficit present.     Mental Status: He is alert and oriented to person, place, and time.     Coordination: Coordination normal.     Comments: GCS 15. Moving all extremities spontaneously.  Psychiatric:        Behavior: Behavior normal.     ED Results / Procedures / Treatments   Labs (all labs ordered are listed, but only abnormal results are displayed) Labs Reviewed  CBC - Abnormal; Notable for the following components:      Result Value   WBC 10.7 (*)    All other components within normal limits  BASIC METABOLIC PANEL - Abnormal; Notable for the following components:   Glucose, Bld 110 (*)    All other components within normal limits  TROPONIN I (HIGH SENSITIVITY)  TROPONIN I (HIGH SENSITIVITY)    EKG EKG Interpretation  Date/Time:  Sunday November 25 2019 14:21:33 EST Ventricular Rate:  68 PR Interval:    QRS Duration: 94 QT Interval:  372 QTC Calculation: 396 R Axis:   86 Text Interpretation: Sinus rhythm Borderline right axis deviation Confirmed by Fredia Sorrow [phone]) on 11/25/2019 2:25:34 PM   Radiology DG Chest 2 View  Result Date: 11/25/2019 CLINICAL DATA:  Chest pain EXAM: CHEST - 2 VIEW COMPARISON:  2017 FINDINGS: The heart size and mediastinal contours are within normal limits. Both lungs are clear. No pleural effusion or pneumothorax. The visualized skeletal structures are unremarkable. IMPRESSION: No acute process in the chest. Electronically Signed   By: Macy Mis M.D.   On: 11/25/2019 14:39    Procedures Procedures (including critical care time)   Stress test 10/2016 Negative stress EKG for ischemia  Per Cards Note (Care Everywhere) Ziopatch placed off of Flecainide (01/2018) which showed 25  episodes of atrial tachycardia in which he was asymptomatic  NM Myocardial Perfusion 10/2016 Result Impression  1. No reversible ischemia or infarction. 2. Normal left ventricular wall motion. 3. Left ventricular ejection fraction 72% 4. Non invasive risk stratification*: Low     Medications Ordered in ED Medications - No data to display  ED Course  I have reviewed the triage vital signs and the nursing notes.  Pertinent labs & imaging results that were available during my care of the patient were reviewed by me and considered in my medical decision making (see chart for details).   3:21 PM Initial work up negative. Troponin 3. CXR without acute abnormality.    MDM Rules/Calculators/A&P                       Patient presents to the emergency department for evaluation of chest pain. Symptoms quite atypical for ACS; nonexertional and without acute SOB, diaphoresis, N/V, leg swelling.  Low suspicion for emergent cardiac etiology given reassuring workup today.  EKG is nonischemic and troponin negative.  Patient has a heart score of 2 consistent with low risk of acute coronary event.  Chest x-ray without evidence of mediastinal widening to suggest dissection.  No pneumothorax, pneumonia, pleural effusion.  Pulmonary embolus further considered; however, patient without tachycardia, tachypnea, dyspnea, hypoxia.  Well's score is 0.  Plan for delta troponin.  If negative, patient instructed to follow-up with his cardiologist and primary care doctor.  Care signed out to [facility], PA-C at shift change we will follow up on pending labs and disposition appropriately.  Vitals:   11/25/19 1430  11/25/19 1500 11/25/19 1530 11/25/19 1600  BP: (!) 158/97 (!) 157/91 (!) 165/90 (!) 151/93  Pulse: 73 65 72 76  Resp: 18 12 18 14   Temp:      TempSrc:      SpO2: 100% 100% 100% 100%  Weight:      Height:        Final Clinical Impression(s) / ED Diagnoses Final diagnoses:  Nonspecific chest pain    Hypertension not at goal    Rx / DC Orders ED Discharge Orders    None       Antonietta Breach, PA-C 11/25/19 1626    Fredia Sorrow, MD 12/07/19 1511

## 2019-11-25 NOTE — Discharge Instructions (Signed)
Your work-up in the emergency department today has been reassuring and did not reveal a concerning cause of your symptoms.  We recommend that you continue your daily medications and follow-up with your primary care doctor for blood pressure recheck.  You would also benefit from follow-up with your cardiologist.  Return for any new or concerning symptoms.

## 2019-11-25 NOTE — ED Triage Notes (Signed)
Pt states that he has been having a funny feeling in his chest today. He states that his neck is  Also hurting. His bp is up.

## 2019-12-11 DIAGNOSIS — Z9889 Other specified postprocedural states: Secondary | ICD-10-CM | POA: Diagnosis not present

## 2019-12-11 DIAGNOSIS — I480 Paroxysmal atrial fibrillation: Secondary | ICD-10-CM | POA: Diagnosis not present

## 2019-12-11 DIAGNOSIS — I10 Essential (primary) hypertension: Secondary | ICD-10-CM | POA: Diagnosis not present

## 2019-12-11 DIAGNOSIS — Z87891 Personal history of nicotine dependence: Secondary | ICD-10-CM | POA: Diagnosis not present

## 2019-12-31 ENCOUNTER — Encounter: Payer: Self-pay | Admitting: Internal Medicine

## 2020-01-04 ENCOUNTER — Telehealth: Payer: Self-pay

## 2020-01-04 NOTE — Telephone Encounter (Signed)
-----   Message from Jerene Bears, MD sent at 01/01/2020 10:58 AM EST ----- Pt need office visit when schedule allows Not urgent Thanks

## 2020-01-04 NOTE — Telephone Encounter (Signed)
Pt scheduled to see Dr. Hilarie Fredrickson 02/21/20@9 :10am, appt letter mailed.

## 2020-01-25 DIAGNOSIS — J209 Acute bronchitis, unspecified: Secondary | ICD-10-CM | POA: Diagnosis not present

## 2020-01-25 DIAGNOSIS — J329 Chronic sinusitis, unspecified: Secondary | ICD-10-CM | POA: Diagnosis not present

## 2020-02-21 ENCOUNTER — Other Ambulatory Visit: Payer: Self-pay | Admitting: Internal Medicine

## 2020-02-21 ENCOUNTER — Ambulatory Visit (INDEPENDENT_AMBULATORY_CARE_PROVIDER_SITE_OTHER): Payer: BC Managed Care – PPO | Admitting: Internal Medicine

## 2020-02-21 ENCOUNTER — Other Ambulatory Visit (INDEPENDENT_AMBULATORY_CARE_PROVIDER_SITE_OTHER): Payer: BC Managed Care – PPO

## 2020-02-21 ENCOUNTER — Encounter: Payer: Self-pay | Admitting: Internal Medicine

## 2020-02-21 DIAGNOSIS — K50113 Crohn's disease of large intestine with fistula: Secondary | ICD-10-CM

## 2020-02-21 DIAGNOSIS — K5010 Crohn's disease of large intestine without complications: Secondary | ICD-10-CM | POA: Diagnosis not present

## 2020-02-21 DIAGNOSIS — D849 Immunodeficiency, unspecified: Secondary | ICD-10-CM | POA: Diagnosis not present

## 2020-02-21 DIAGNOSIS — K50119 Crohn's disease of large intestine with unspecified complications: Secondary | ICD-10-CM

## 2020-02-21 DIAGNOSIS — K219 Gastro-esophageal reflux disease without esophagitis: Secondary | ICD-10-CM | POA: Diagnosis not present

## 2020-02-21 DIAGNOSIS — Z79899 Other long term (current) drug therapy: Secondary | ICD-10-CM | POA: Diagnosis not present

## 2020-02-21 LAB — CBC WITH DIFFERENTIAL/PLATELET
Basophils Absolute: 0.1 10*3/uL (ref 0.0–0.1)
Basophils Relative: 1.1 % (ref 0.0–3.0)
Eosinophils Absolute: 0.5 10*3/uL (ref 0.0–0.7)
Eosinophils Relative: 5.8 % — ABNORMAL HIGH (ref 0.0–5.0)
HCT: 40.1 % (ref 39.0–52.0)
Hemoglobin: 13.5 g/dL (ref 13.0–17.0)
Lymphocytes Relative: 30.6 % (ref 12.0–46.0)
Lymphs Abs: 2.6 10*3/uL (ref 0.7–4.0)
MCHC: 33.8 g/dL (ref 30.0–36.0)
MCV: 83.8 fl (ref 78.0–100.0)
Monocytes Absolute: 1 10*3/uL (ref 0.1–1.0)
Monocytes Relative: 11.7 % (ref 3.0–12.0)
Neutro Abs: 4.3 10*3/uL (ref 1.4–7.7)
Neutrophils Relative %: 50.8 % (ref 43.0–77.0)
Platelets: 269 10*3/uL (ref 150.0–400.0)
RBC: 4.79 Mil/uL (ref 4.22–5.81)
RDW: 14.7 % (ref 11.5–15.5)
WBC: 8.5 10*3/uL (ref 4.0–10.5)

## 2020-02-21 LAB — COMPREHENSIVE METABOLIC PANEL
ALT: 16 U/L (ref 0–53)
AST: 19 U/L (ref 0–37)
Albumin: 4.2 g/dL (ref 3.5–5.2)
Alkaline Phosphatase: 75 U/L (ref 39–117)
BUN: 14 mg/dL (ref 6–23)
CO2: 27 mEq/L (ref 19–32)
Calcium: 9.4 mg/dL (ref 8.4–10.5)
Chloride: 101 mEq/L (ref 96–112)
Creatinine, Ser: 1.29 mg/dL (ref 0.40–1.50)
GFR: 55.95 mL/min — ABNORMAL LOW (ref >60.00)
Glucose, Bld: 96 mg/dL (ref 70–99)
Potassium: 4 mEq/L (ref 3.5–5.1)
Sodium: 136 mEq/L (ref 135–145)
Total Bilirubin: 1 mg/dL (ref 0.2–1.2)
Total Protein: 7.2 g/dL (ref 6.0–8.3)

## 2020-02-21 MED ORDER — SUPREP BOWEL PREP KIT 17.5-3.13-1.6 GM/177ML PO SOLN: 1.0000 | ORAL | 0 refills | Status: DC

## 2020-02-21 NOTE — Patient Instructions (Addendum)
If you are age 65 or older, your body mass index should be between 23-30. Your Body mass index is 31.3 kg/m. If this is out of the aforementioned range listed, please consider follow up with your Primary Care Provider.  If you are age 94 or younger, your body mass index should be between 19-25. Your Body mass index is 31.3 kg/m. If this is out of the aformentioned range listed, please consider follow up with your Primary Care Provider.   Your provider has requested that you go to the basement level for lab work before leaving today. Press "B" on the elevator. The lab is located at the first door on the left as you exit the elevator.  You have been scheduled for a colonoscopy. Please follow written instructions given to you at your visit today.  Please pick up your prep supplies at the pharmacy within the next 1-3 days. If you use inhalers (even only as needed), please bring them with you on the day of your procedure.  Continue Humira 12m every 14 days  Continue pantoprazole 463mone tablet daily  Thank you, Dr PyHilarie Fredrickson

## 2020-02-21 NOTE — Addendum Note (Signed)
Addended by: Stevan Born on: 02/21/2020 02:53 PM   Modules accepted: Orders

## 2020-02-21 NOTE — Progress Notes (Signed)
Subjective:    Patient ID: Hunter Edwards, male    DOB: 25-Jun-1956, 64 y.o.   MRN: [id number]  HPI Hunter Edwards is a 64 year old male with a history of Crohn's colitis with perianal involvement and prior fistula diagnosed in 2011 who returns for follow-up.  He is here alone today and was last seen at the time of his colonoscopy which I performed in July 2020.  Colonoscopy on 06/11/2019 showed scarring from perianal fistula and skin tags.  The terminal ileum was normal.  There was active Crohn's disease from the rectum to the descending colon which was severe.  This was biopsied.  There were pseudopolyps in the transverse colon.  There was a small cecal polyp.  Biopsies found the cecal polyp to be an adenoma, transverse colon biopsy showed chronic focally active colitis, descending colon chronic moderately active colitis, sigmoid chronic moderately active colitis with ulceration.  No dysplasia or malignancy.  After this colonoscopy we started him on Humira therapy which he has been using 40 mg every 14 days.  He reports he has been tolerating this well though he does have some minor injection site reactions with each dose.  He will have a swelling like a big mosquito bite which itches and lasts about 12 hours.  Seems to be less severe when he injects into his abdomen rather than in his upper thighs.  His last dose was 14 days ago and he is due to dose later today.  From Crohn's perspective he reports that he feels very well.  No abdominal pain.  No rectal bleeding.  Bowel movements are regular without blood or melena.  He will occasionally and sporadically see blood with wiping only.  He does have prolapsing perianal tissue which he has to reduce with bowel movement.  He wonders if this can be removed.  No perianal pain or tenesmus symptoms.  Good appetite.  No nausea or vomiting.  No unintentional weight loss.  He does have heartburn and tried to wean off pantoprazole but he realizes that he probably  needs it.  He takes 40 mg daily.  No dysphagia.  He had a [employer] COVID-19 vaccination and did well with it.   Review of Systems As per HPI, otherwise negative  Current Medications, Allergies, Past Medical History, Past Surgical History, Family History and Social History were reviewed in [employer] record.      Objective:   Physical Exam BP 136/86 (BP Location: Left Arm, Patient Position: Sitting, Cuff Size: Normal)   Pulse 84   Temp 97.6 F (36.4 C)   Ht 5' 5.5" (1.664 m)   Wt 191 lb (86.6 kg)   BMI 31.30 kg/m   Gen: awake, alert, NAD HEENT: anicteric, op clear CV: RRR, no mrg Pulm: CTA b/l Abd: soft, NT/ND, +BS throughout Ext: no c/c/e Neuro: nonfocal  CBC    Component Value Date/Time   WBC 10.7 (H) 11/25/2019 1408   RBC 5.01 11/25/2019 1408   HGB 13.9 11/25/2019 1408   HCT 42.6 11/25/2019 1408   PLT 288 11/25/2019 1408   MCV 85.0 11/25/2019 1408   MCH 27.7 11/25/2019 1408   MCHC 32.6 11/25/2019 1408   RDW 14.5 11/25/2019 1408   LYMPHSABS 2.2 05/21/2019 1417   MONOABS 1.0 05/21/2019 1417   EOSABS 0.2 05/21/2019 1417   BASOSABS 0.1 05/21/2019 1417   CMP     Component Value Date/Time   NA 135 11/25/2019 1408   NA 141 11/04/2016 0000  K 3.8 11/25/2019 1408   CL 100 11/25/2019 1408   CO2 27 11/25/2019 1408   GLUCOSE 110 (H) 11/25/2019 1408   BUN 12 11/25/2019 1408   BUN 10 11/04/2016 0000   CREATININE 1.21 11/25/2019 1408   CALCIUM 9.1 11/25/2019 1408   PROT 7.0 05/21/2019 1417   ALBUMIN 4.1 05/21/2019 1417   AST 17 05/21/2019 1417   ALT 15 05/21/2019 1417   ALKPHOS 72 05/21/2019 1417   BILITOT 1.0 05/21/2019 1417   GFRNONAA >60 11/25/2019 1408   GFRAA >60 11/25/2019 1408         Assessment & Plan:  64 year old male with a history of Crohn's colitis with perianal involvement and prior fistula diagnosed in 2011 who returns for follow-up.  1.  Crohn's colitis with perianal involvement and history of fistula  --he had severe Crohn's colitis from rectum to the descending colon also present histologically in the transverse colon last July.  He has since been restarted on anti-TNF therapy with Humira which he seems to be tolerating.  He has reached clinical remission.  We discussed the importance of continuing therapy but also determining if he has obtained endoscopic remission.  I recommended colonoscopy for assessment of disease activity.  We discussed the risk, benefits and alternatives and he is agreeable and wishes to proceed --Continue Humira 40 mg every 14 days --CBC, CMP, QuantiFERON gold and Humira level and antibody test today at trough --Colonoscopy in the [place]  2.  GERD --well-controlled pantoprazole 40 mg a day.  Continue current dose.  30 minutes total spent today including patient facing time, coordination of care, reviewing medical history/procedures/pertinent radiology studies, and documentation of the encounter.

## 2020-02-23 LAB — QUANTIFERON-TB GOLD PLUS
Mitogen-NIL: >10 IU/mL
NIL: 0.03 IU/mL
QuantiFERON-TB Gold Plus: NEGATIVE
TB1-NIL: 0 IU/mL
TB2-NIL: 0 IU/mL

## 2020-03-04 LAB — SERIAL MONITORING

## 2020-03-05 LAB — ADALIMUMAB+AB (SERIAL MONITOR)
Adalimumab Drug Level: 7.7 ug/mL
Anti-Adalimumab Antibody: <25 ng/mL

## 2020-04-03 ENCOUNTER — Telehealth: Payer: Self-pay

## 2020-04-03 NOTE — Telephone Encounter (Signed)
Called pt on yesterday and today to confirm whether or not pt had a COVID test or completed COVID vaccination series for his upcoming colonoscopy scheduled on 04/04/20.  No answer.  LMTCB.  Pt returned call and stated that he had the Stephen vaccine on 02/08/20.

## 2020-04-04 ENCOUNTER — Other Ambulatory Visit: Payer: Self-pay

## 2020-04-04 ENCOUNTER — Ambulatory Visit (AMBULATORY_SURGERY_CENTER): Payer: BC Managed Care – PPO | Admitting: Internal Medicine

## 2020-04-04 ENCOUNTER — Encounter: Payer: Self-pay | Admitting: Internal Medicine

## 2020-04-04 VITALS — BP 122/75 | HR 58 | Temp 97.5°F | Resp 18 | Ht 65.5 in | Wt 191.0 lb

## 2020-04-04 DIAGNOSIS — K5010 Crohn's disease of large intestine without complications: Secondary | ICD-10-CM | POA: Diagnosis not present

## 2020-04-04 DIAGNOSIS — K5120 Ulcerative (chronic) proctitis without complications: Secondary | ICD-10-CM | POA: Diagnosis not present

## 2020-04-04 DIAGNOSIS — K5090 Crohn's disease, unspecified, without complications: Secondary | ICD-10-CM | POA: Diagnosis not present

## 2020-04-04 DIAGNOSIS — K649 Unspecified hemorrhoids: Secondary | ICD-10-CM | POA: Diagnosis not present

## 2020-04-04 DIAGNOSIS — K5730 Diverticulosis of large intestine without perforation or abscess without bleeding: Secondary | ICD-10-CM | POA: Diagnosis not present

## 2020-04-04 DIAGNOSIS — K626 Ulcer of anus and rectum: Secondary | ICD-10-CM

## 2020-04-04 DIAGNOSIS — D123 Benign neoplasm of transverse colon: Secondary | ICD-10-CM | POA: Diagnosis not present

## 2020-04-04 MED ORDER — SODIUM CHLORIDE 0.9 % IV SOLN: 500.0000 mL | Freq: Once | INTRAVENOUS | Status: DC

## 2020-04-04 NOTE — Progress Notes (Signed)
Report to PACU, RN, vss, BBS= Clear.  

## 2020-04-04 NOTE — Progress Notes (Signed)
Called to room to assist during endoscopic procedure.  Patient ID and intended procedure confirmed with present staff. Received instructions for my participation in the procedure from the performing physician.  

## 2020-04-04 NOTE — Progress Notes (Signed)
Vitals by KA  Pt's states no medical or surgical changes since previsit or office visit.

## 2020-04-04 NOTE — Op Note (Signed)
[facility] Patient Name: Hunter Edwards Procedure Date: 04/04/2020 1:29 PM MRN: [id number] Endoscopist: Jerene Bears , MD Age: 64 Referring MD:  Date of Birth: 07/16/1956 Gender: Male Account #: [id number] Procedure:                Colonoscopy Indications:              Assess therapeutic response to therapy of Crohn's                            disease of the colon, last colonoscopy July 2020                            and Humira started thereafter Medicines:                Monitored Anesthesia Care Procedure:                Pre-Anesthesia Assessment:                           - Prior to the procedure, a History and Physical                            was performed, and patient medications and                            allergies were reviewed. The patient's tolerance of                            previous anesthesia was also reviewed. The risks                            and benefits of the procedure and the sedation                            options and risks were discussed with the patient.                            All questions were answered, and informed consent                            was obtained. Prior Anticoagulants: The patient has                            taken no previous anticoagulant or antiplatelet                            agents. ASA Grade Assessment: II - A patient with                            mild systemic disease. After reviewing the risks                            and benefits, the patient was deemed in  satisfactory condition to undergo the procedure.                           After obtaining informed consent, the colonoscope                            was passed under direct vision. Throughout the                            procedure, the patient's blood pressure, pulse, and                            oxygen saturations were monitored continuously. The                            Colonoscope was introduced  through the anus and                            advanced to the terminal ileum. The colonoscopy was                            performed without difficulty. The patient tolerated                            the procedure well. The quality of the bowel                            preparation was good. The terminal ileum, ileocecal                            valve, appendiceal orifice, and rectum were                            photographed. Scope In: 1:36:36 PM Scope Out: 1:52:45 PM Scope Withdrawal Time: 0 hours 14 minutes 13 seconds  Total Procedure Duration: 0 hours 16 minutes 9 seconds  Findings:                 Skin tags were found on perianal exam and distal                            rectal edema.                           The terminal ileum appeared normal.                           A 5 mm polyp was found in the transverse colon. The                            polyp was sessile. The polyp was removed with a                            cold snare. Resection and retrieval were complete.  The Simple Endoscopic Score for Crohn's Disease was                            determined based on the endoscopic appearance of                            the mucosa in the following segments:                           - Ileum: Findings include no ulcers present, no                            ulcerated surfaces, no affected surfaces and no                            narrowings. Segment score: 0.                           - Right Colon: Findings include no ulcers present,                            no ulcerated surfaces, no affected surfaces and no                            narrowings. Segment score: 0.                           - Transverse Colon: Findings include no ulcers                            present, no ulcerated surfaces, no affected                            surfaces and no narrowings. Segment score: 0.                            Pseudopolyps present.                            - Left Colon: Findings include no ulcers present,                            no ulcerated surfaces, no affected surfaces and no                            narrowings. Segment score: 0. Pseudopolyps present.                           - Rectum: Findings include a large ulcer 0.5-2 cm                            in size in the distal rectum, less than 10%  ulcerated surfaces, less than 50% of surfaces                            affected and no narrowings. Segment score: 4.                           - Total SES-CD aggregate score: 4. Four biopsies                            were taken every 10 cm with a cold forceps from the                            entire colon for Crohn's disease surveillance.                            These biopsy specimens from the right colon, left                            colon and rectum were sent to Pathology.                           A few small-mouthed diverticula were found in the                            descending colon, transverse colon and ascending                            colon.                           Internal hemorrhoids were found during retroflexion. Complications:            No immediate complications. Estimated Blood Loss:     Estimated blood loss was minimal. Impression:               - Perianal skin tags found on perianal exam.                           - The examined portion of the ileum was normal.                           - One 5 mm polyp in the transverse colon, removed                            with a cold snare. Resected and retrieved.                           - Much improved Crohn's colitis, endoscopic                            remission in the left colon. Ulceration and tissue                            edema in the distal rectum. Surveillance biopsies.                           -  Diverticulosis in the descending colon, in the                            transverse colon and in the ascending  colon.                           - Internal hemorrhoids. Recommendation:           - Patient has a contact number available for                            emergencies. The signs and symptoms of potential                            delayed complications were discussed with the                            patient. Return to normal activities tomorrow.                            Written discharge instructions were provided to the                            patient.                           - Resume previous diet.                           - Continue present medications. Continue Humira 40                            mg every 14 days.                           - Await pathology results.                           - Repeat colonoscopy is recommended. The                            colonoscopy date will be determined after pathology                            results from today's exam become available for                            review. Jerene Bears, MD 04/04/2020 2:05:13 PM This report has been signed electronically.

## 2020-04-04 NOTE — Patient Instructions (Signed)
Handouts provided:  Polyps  YOU HAD AN ENDOSCOPIC PROCEDURE TODAY AT [facility]:   Refer to the procedure report that was given to you for any specific questions about what was found during the examination.  If the procedure report does not answer your questions, please call your gastroenterologist to clarify.  If you requested that your care partner not be given the details of your procedure findings, then the procedure report has been included in a sealed envelope for you to review at your convenience later.  YOU SHOULD EXPECT: Some feelings of bloating in the abdomen. Passage of more gas than usual.  Walking can help get rid of the air that was put into your GI tract during the procedure and reduce the bloating. If you had a lower endoscopy (such as a colonoscopy or flexible sigmoidoscopy) you may notice spotting of blood in your stool or on the toilet paper. If you underwent a bowel prep for your procedure, you may not have a normal bowel movement for a few days.  Please Note:  You might notice some irritation and congestion in your nose or some drainage.  This is from the oxygen used during your procedure.  There is no need for concern and it should clear up in a day or so.  SYMPTOMS TO REPORT IMMEDIATELY:   Following lower endoscopy (colonoscopy or flexible sigmoidoscopy):  Excessive amounts of blood in the stool  Significant tenderness or worsening of abdominal pains  Swelling of the abdomen that is new, acute  Fever of 100F or higher  For urgent or emergent issues, a gastroenterologist can be reached at any hour by calling [phone]. Do not use MyChart messaging for urgent concerns.    DIET:  We do recommend a small meal at first, but then you may proceed to your regular diet.  Drink plenty of fluids but you should avoid alcoholic beverages for 24 hours.  ACTIVITY:  You should plan to take it easy for the rest of today and you should NOT DRIVE or use heavy  machinery until tomorrow (because of the sedation medicines used during the test).    FOLLOW UP: Our staff will call the number listed on your records 48-72 hours following your procedure to check on you and address any questions or concerns that you may have regarding the information given to you following your procedure. If we do not reach you, we will leave a message.  We will attempt to reach you two times.  During this call, we will ask if you have developed any symptoms of COVID 19. If you develop any symptoms (ie: fever, flu-like symptoms, shortness of breath, cough etc.) before then, please call [phone].  If you test positive for Covid 19 in the 2 weeks post procedure, please call and report this information to [place].    If any biopsies were taken you will be contacted by phone or by letter within the next 1-3 weeks.  Please call us at [phone] if you have not heard about the biopsies in 3 weeks.    SIGNATURES/CONFIDENTIALITY: You and/or your care partner have signed paperwork which will be entered into your electronic medical record.  These signatures attest to the fact that that the information above on your After Visit Summary has been reviewed and is understood.  Full responsibility of the confidentiality of this discharge information lies with you and/or your care-partner.

## 2020-04-08 ENCOUNTER — Telehealth: Payer: Self-pay

## 2020-04-08 NOTE — Telephone Encounter (Signed)
2nd follow up call made.  NALM 

## 2020-04-08 NOTE — Telephone Encounter (Signed)
Patient refuse to be called back until after 1030am.

## 2020-04-09 ENCOUNTER — Encounter: Payer: Self-pay | Admitting: Internal Medicine

## 2020-06-04 ENCOUNTER — Encounter: Payer: BC Managed Care – PPO | Admitting: Internal Medicine

## 2020-06-16 DIAGNOSIS — R739 Hyperglycemia, unspecified: Secondary | ICD-10-CM | POA: Insufficient documentation

## 2020-06-16 DIAGNOSIS — R7303 Prediabetes: Secondary | ICD-10-CM | POA: Insufficient documentation

## 2020-06-16 NOTE — Patient Instructions (Addendum)
BP goal < 140/90  Blood work was ordered.    All other Health Maintenance issues reviewed.   All recommended immunizations and age-appropriate screenings are up-to-date or discussed.  No immunization administered today.   Medications reviewed and updated.  Changes include :   none    Please followup in 1 year    Health Maintenance, Male Adopting a healthy lifestyle and getting preventive care are important in promoting health and wellness. Ask your health care provider about:  The right schedule for you to have regular tests and exams.  Things you can do on your own to prevent diseases and keep yourself healthy. What should I know about diet, weight, and exercise? Eat a healthy diet   Eat a diet that includes plenty of vegetables, fruits, low-fat dairy products, and lean protein.  Do not eat a lot of foods that are high in solid fats, added sugars, or sodium. Maintain a healthy weight Body mass index (BMI) is a measurement that can be used to identify possible weight problems. It estimates body fat based on height and weight. Your health care provider can help determine your BMI and help you achieve or maintain a healthy weight. Get regular exercise Get regular exercise. This is one of the most important things you can do for your health. Most adults should:  Exercise for at least 150 minutes each week. The exercise should increase your heart rate and make you sweat (moderate-intensity exercise).  Do strengthening exercises at least twice a week. This is in addition to the moderate-intensity exercise.  Spend less time sitting. Even light physical activity can be beneficial. Watch cholesterol and blood lipids Have your blood tested for lipids and cholesterol at 65 years of age, then have this test every 5 years. You may need to have your cholesterol levels checked more often if:  Your lipid or cholesterol levels are high.  You are older than 65 years of age.  You are at  high risk for heart disease. What should I know about cancer screening? Many types of cancers can be detected early and may often be prevented. Depending on your health history and family history, you may need to have cancer screening at various ages. This may include screening for:  Colorectal cancer.  Prostate cancer.  Skin cancer.  Lung cancer. What should I know about heart disease, diabetes, and high blood pressure? Blood pressure and heart disease  High blood pressure causes heart disease and increases the risk of stroke. This is more likely to develop in people who have high blood pressure readings, are of African descent, or are overweight.  Talk with your health care provider about your target blood pressure readings.  Have your blood pressure checked: ? Every 3-5 years if you are 56-4 years of age. ? Every year if you are 12 years old or older.  If you are between the ages of 92 and 83 and are a current or former smoker, ask your health care provider if you should have a one-time screening for abdominal aortic aneurysm (AAA). Diabetes Have regular diabetes screenings. This checks your fasting blood sugar level. Have the screening done:  Once every three years after age 48 if you are at a normal weight and have a low risk for diabetes.  More often and at a younger age if you are overweight or have a high risk for diabetes. What should I know about preventing infection? Hepatitis B If you have a higher risk for hepatitis B,  you should be screened for this virus. Talk with your health care provider to find out if you are at risk for hepatitis B infection. Hepatitis C Blood testing is recommended for:  Everyone born from [birth year] through 1966.  Anyone with known risk factors for hepatitis C. Sexually transmitted infections (STIs)  You should be screened each year for STIs, including gonorrhea and chlamydia, if: ? You are sexually active and are younger than 65 years of  age. ? You are older than 65 years of age and your health care provider tells you that you are at risk for this type of infection. ? Your sexual activity has changed since you were last screened, and you are at increased risk for chlamydia or gonorrhea. Ask your health care provider if you are at risk.  Ask your health care provider about whether you are at high risk for HIV. Your health care provider may recommend a prescription medicine to help prevent HIV infection. If you choose to take medicine to prevent HIV, you should first get tested for HIV. You should then be tested every 3 months for as long as you are taking the medicine. Follow these instructions at home: Lifestyle  Do not use any products that contain nicotine or tobacco, such as cigarettes, e-cigarettes, and chewing tobacco. If you need help quitting, ask your health care provider.  Do not use street drugs.  Do not share needles.  Ask your health care provider for help if you need support or information about quitting drugs. Alcohol use  Do not drink alcohol if your health care provider tells you not to drink.  If you drink alcohol: ? Limit how much you have to 0-2 drinks a day. ? Be aware of how much alcohol is in your drink. In the [place] one drink equals one 12 oz bottle of beer (355 mL), one 5 oz glass of wine (148 mL), or one 1 oz glass of hard liquor (44 mL). General instructions  Schedule regular health, dental, and eye exams.  Stay current with your vaccines.  Tell your health care provider if: ? You often feel depressed. ? You have ever been abused or do not feel safe at home. Summary  Adopting a healthy lifestyle and getting preventive care are important in promoting health and wellness.  Follow your health care provider's instructions about healthy diet, exercising, and getting tested or screened for diseases.  Follow your health care provider's instructions on monitoring your cholesterol and blood  pressure. This information is not intended to replace advice given to you by your health care provider. Make sure you discuss any questions you have with your health care provider. Document Revised: 10/25/2018 Document Reviewed: 10/25/2018 Elsevier Patient Education  2020 [employer].

## 2020-06-16 NOTE — Progress Notes (Signed)
Subjective:    Patient ID: Hunter Edwards, male    DOB: 1956-02-05, 64 y.o.   MRN: [id number]  HPI He is here for a physical exam.   BP at home 128-130/75-84 at home w/o medication.  Has not taken losartan x 3 months.     Medications and allergies reviewed with patient and updated if appropriate.  Patient Active Problem List   Diagnosis Date Noted  . Hyperglycemia 06/16/2020  . Rash and nonspecific skin eruption 05/29/2018  . Muscle cramp 04/14/2018  . Essential hypertension 06/11/2015  . Paroxysmal atrial fibrillation ([place]) 05/07/2015  . GERD (gastroesophageal reflux disease) 05/07/2015  . Crohn disease ([place]) 05/07/2015    Current Outpatient Medications on File Prior to Visit  Medication Sig Dispense Refill  . Adalimumab (HUMIRA PEN) 40 MG/0.4ML PNKT Inject 40 mg into the skin every 14 (fourteen) days. 2 each 11  . aspirin 81 MG tablet Take 81 mg by mouth daily.    Raelyn Ensign Pollen 550 MG CAPS Take 2 capsules by mouth daily.    Marland Kitchen lidocaine (XYLOCAINE) 5 % ointment Apply 1 application topically as needed. 240 g 0  . MAGNESIUM PO Take by mouth daily.    . pantoprazole (PROTONIX) 40 MG tablet Take 1 tablet (40 mg total) by mouth daily. 90 tablet 3  . POTASSIUM PO Take by mouth daily.     No current facility-administered medications on file prior to visit.    Past Medical History:  Diagnosis Date  . Anal fistula   . Anorectal fistula   . Atrial fibrillation ([place]) 2016  . Benign hypertension   . Cataract   . Chest pain   . Colitis   . Contact dermatitis   . Crohn's disease ([place]) 2011  . Disease of jaw   . Dysrhythmia 01/14/15   AFib  . Fatigue   . Folliculitis   . GERD (gastroesophageal reflux disease)   . History of Holter monitoring    Will be done on Friday, 01/17/15  . Hyperlipidemia   . IDA (iron deficiency anemia)   . Need for Tdap vaccination   . Neoplasm of prostate   . Palpitations   . Perirectal abscess   . Rash   . Rectal discharge     Past  Surgical History:  Procedure Laterality Date  . ANAL FISTULECTOMY N/A 01/24/2015   Procedure: FISTULECTOMY ANAL;  Surgeon: [name], MD;  Location: [facility];  Service: General;  Laterality: N/A;  . RECTAL EXAM UNDER ANESTHESIA N/A 01/24/2015   Procedure: ANAL  EXAM UNDER ANESTHESIA;  Surgeon: [name], MD;  Location: [facility];  Service: General;  Laterality: N/A;  . TONSILLECTOMY  1962    Social History   Socioeconomic History  . Marital status: Married    Spouse name: Not on file  . Number of children: 1  . Years of education: Not on file  . Highest education level: Not on file  Occupational History  . Occupation: Truck Geophysicist/field seismologist  Tobacco Use  . Smoking status: Former Smoker    Packs/day: 1.00    Types: Cigarettes    Quit date: 11/15/1996    Years since quitting: 23.6  . Smokeless tobacco: Never Used  Vaping Use  . Vaping Use: Never used  Substance and Sexual Activity  . Alcohol use: No    Alcohol/week: 0.0 standard drinks    Comment: Rarely  . Drug use: No  . Sexual activity: Not on file  Other Topics  Concern  . Not on file  Social History Narrative  . Not on file   Social Determinants of Health   Financial Resource Strain:   . Difficulty of Paying Living Expenses:   Food Insecurity:   . Worried About Charity fundraiser in the Last Year:   . Arboriculturist in the Last Year:   Transportation Needs:   . Film/video editor (Medical):   Marland Kitchen Lack of Transportation (Non-Medical):   Physical Activity:   . Days of Exercise per Week:   . Minutes of Exercise per Session:   Stress:   . Feeling of Stress :   Social Connections:   . Frequency of Communication with Friends and Family:   . Frequency of Social Gatherings with Friends and Family:   . Attends Religious Services:   . Active Member of Clubs or Organizations:   . Attends Archivist Meetings:   Marland Kitchen Marital Status:     Family History  Problem Relation Age  of Onset  . Stomach cancer Father        died in his 70's  . Diabetes Brother   . Heart disease Brother   . Colon cancer Neg Hx   . Colon polyps Neg Hx   . Kidney disease Neg Hx   . Esophageal cancer Neg Hx   . Gallbladder disease Neg Hx     Review of Systems  Constitutional: Negative for chills and fever.  Eyes: Negative for visual disturbance.  Respiratory: Positive for cough (with allergies) and wheezing (with allergies). Negative for shortness of breath.   Cardiovascular: Negative for chest pain, palpitations and leg swelling.  Gastrointestinal: Negative for abdominal pain, blood in stool, constipation, diarrhea and nausea.  Genitourinary: Negative for difficulty urinating, dysuria and hematuria.  Musculoskeletal: Positive for arthralgias (knees, hips, wrists, shoulder) and back pain (chronic lower back).  Skin: Positive for rash.  Neurological: Negative for light-headedness and headaches.  Psychiatric/Behavioral: Negative for dysphoric mood. The patient is not nervous/anxious.        Objective:   Vitals:   06/17/20 1333  BP: 140/90  Pulse: 89  Temp: 98 F (36.7 C)  SpO2: 99%   Filed Weights   06/17/20 1333  Weight: 180 lb (81.6 kg)   Body mass index is 29.5 kg/m.  BP Readings from Last 3 Encounters:  06/17/20 140/90  04/04/20 122/75  02/21/20 136/86    Wt Readings from Last 3 Encounters:  06/17/20 180 lb (81.6 kg)  04/04/20 191 lb (86.6 kg)  02/21/20 191 lb (86.6 kg)     Physical Exam Constitutional: He appears well-developed and well-nourished. No distress.  HENT:  Head: Normocephalic and atraumatic.  Right Ear: External ear normal.  Left Ear: External ear normal.  Mouth/Throat: Oropharynx is clear and moist.  Normal ear canals and TM b/l  Eyes: Conjunctivae and EOM are normal.  Neck: Neck supple. No tracheal deviation present. No thyromegaly present.  No carotid bruit  Cardiovascular: Normal rate, regular rhythm, normal heart sounds and intact  distal pulses.   No murmur heard. Pulmonary/Chest: Effort normal and breath sounds normal. No respiratory distress. He has no wheezes. He has no rales.  Abdominal: Soft. He exhibits no distension. There is no tenderness.  Genitourinary: deferred  Musculoskeletal: He exhibits no edema.  Lymphadenopathy:   He has no cervical adenopathy.  Skin: Skin is warm and dry. He is not diaphoretic.  Psychiatric: He has a normal mood and affect. His behavior is normal.  Assessment & Plan:   Physical exam: Screening blood work  ordered Immunizations  had Covid, ? prevnar Colonoscopy   Up to date  Eye exams   Up to date  Exercise   Active, no regimented exercise Weight  Has lost weight Substance abuse   none  See Problem List for Assessment and Plan of chronic medical problems.   This visit occurred during the SARS-CoV-2 public health emergency.  Safety protocols were in place, including screening questions prior to the visit, additional usage of staff PPE, and extensive cleaning of exam room while observing appropriate contact time as indicated for disinfecting solutions.

## 2020-06-17 ENCOUNTER — Ambulatory Visit (INDEPENDENT_AMBULATORY_CARE_PROVIDER_SITE_OTHER): Payer: BC Managed Care – PPO | Admitting: Internal Medicine

## 2020-06-17 ENCOUNTER — Encounter: Payer: Self-pay | Admitting: Internal Medicine

## 2020-06-17 ENCOUNTER — Other Ambulatory Visit: Payer: Self-pay

## 2020-06-17 VITALS — BP 140/90 | HR 89 | Temp 98.0°F | Ht 65.5 in | Wt 180.0 lb

## 2020-06-17 DIAGNOSIS — K50919 Crohn's disease, unspecified, with unspecified complications: Secondary | ICD-10-CM

## 2020-06-17 DIAGNOSIS — Z125 Encounter for screening for malignant neoplasm of prostate: Secondary | ICD-10-CM

## 2020-06-17 DIAGNOSIS — R739 Hyperglycemia, unspecified: Secondary | ICD-10-CM

## 2020-06-17 DIAGNOSIS — R21 Rash and other nonspecific skin eruption: Secondary | ICD-10-CM

## 2020-06-17 DIAGNOSIS — R252 Cramp and spasm: Secondary | ICD-10-CM

## 2020-06-17 DIAGNOSIS — I10 Essential (primary) hypertension: Secondary | ICD-10-CM

## 2020-06-17 DIAGNOSIS — K219 Gastro-esophageal reflux disease without esophagitis: Secondary | ICD-10-CM

## 2020-06-17 DIAGNOSIS — Z0000 Encounter for general adult medical examination without abnormal findings: Secondary | ICD-10-CM

## 2020-06-17 DIAGNOSIS — I480 Paroxysmal atrial fibrillation: Secondary | ICD-10-CM

## 2020-06-17 DIAGNOSIS — N529 Male erectile dysfunction, unspecified: Secondary | ICD-10-CM

## 2020-06-17 MED ORDER — SILDENAFIL CITRATE 100 MG PO TABS: 50.0000 mg | ORAL_TABLET | Freq: Every day | ORAL | 11 refills | Status: DC | PRN

## 2020-06-17 NOTE — Assessment & Plan Note (Signed)
Chronic Not any medication Monitor off medication - has been controlled - higher here Goal less than 140/90 cmp

## 2020-06-17 NOTE — Assessment & Plan Note (Signed)
Acute Trial of viagra

## 2020-06-17 NOTE — Assessment & Plan Note (Signed)
Chronic Controlled on Humira Following w GI

## 2020-06-17 NOTE — Assessment & Plan Note (Signed)
a1c

## 2020-06-17 NOTE — Assessment & Plan Note (Signed)
Per derm - eczema Uses cortisone otc

## 2020-06-17 NOTE — Assessment & Plan Note (Signed)
S/p ablation following with cardiology

## 2020-06-17 NOTE — Assessment & Plan Note (Signed)
Chronic GERD controlled Continue daily medication Pantoprazole 40 mg daily

## 2020-06-17 NOTE — Assessment & Plan Note (Signed)
Chronic Intermittent Mag, potassium pills - helps some Still has some cramping - related to dehydration Increase water

## 2020-06-18 ENCOUNTER — Encounter: Payer: Self-pay | Admitting: Internal Medicine

## 2020-06-18 DIAGNOSIS — E785 Hyperlipidemia, unspecified: Secondary | ICD-10-CM | POA: Insufficient documentation

## 2020-06-18 LAB — CBC WITH DIFFERENTIAL/PLATELET
Absolute Monocytes: 774 cells/uL (ref 200–950)
Basophils Absolute: 72 cells/uL (ref 0–200)
Basophils Relative: 0.8 %
Eosinophils Absolute: 261 cells/uL (ref 15–500)
Eosinophils Relative: 2.9 %
HCT: 41.7 % (ref 38.5–50.0)
Hemoglobin: 14 g/dL (ref 13.2–17.1)
Lymphs Abs: 2232 cells/uL (ref 850–3900)
MCH: 27.8 pg (ref 27.0–33.0)
MCHC: 33.6 g/dL (ref 32.0–36.0)
MCV: 82.9 fL (ref 80.0–100.0)
MPV: 10.7 fL (ref 7.5–12.5)
Monocytes Relative: 8.6 %
Neutro Abs: 5661 cells/uL (ref 1500–7800)
Neutrophils Relative %: 62.9 %
Platelets: 283 10*3/uL (ref 140–400)
RBC: 5.03 10*6/uL (ref 4.20–5.80)
RDW: 13.4 % (ref 11.0–15.0)
Total Lymphocyte: 24.8 %
WBC: 9 10*3/uL (ref 3.8–10.8)

## 2020-06-18 LAB — LIPID PANEL
Cholesterol: 220 mg/dL — ABNORMAL HIGH (ref <200)
HDL: 67 mg/dL (ref >=40)
LDL Cholesterol (Calc): 137 mg/dL (calc) — ABNORMAL HIGH
Non-HDL Cholesterol (Calc): 153 mg/dL (calc) — ABNORMAL HIGH (ref <130)
Total CHOL/HDL Ratio: 3.3 (calc) (ref <5.0)
Triglycerides: 64 mg/dL (ref <150)

## 2020-06-18 LAB — HEMOGLOBIN A1C
Hgb A1c MFr Bld: 5.9 % of total Hgb — ABNORMAL HIGH (ref <5.7)
Mean Plasma Glucose: 123 (calc)
eAG (mmol/L): 6.8 (calc)

## 2020-06-18 LAB — COMPREHENSIVE METABOLIC PANEL
AG Ratio: 1.5 (calc) (ref 1.0–2.5)
ALT: 21 U/L (ref 9–46)
AST: 23 U/L (ref 10–35)
Albumin: 4.1 g/dL (ref 3.6–5.1)
Alkaline phosphatase (APISO): 70 U/L (ref 35–144)
BUN: 16 mg/dL (ref 7–25)
CO2: 25 mmol/L (ref 20–32)
Calcium: 9.6 mg/dL (ref 8.6–10.3)
Chloride: 102 mmol/L (ref 98–110)
Creat: 1.22 mg/dL (ref 0.70–1.25)
Globulin: 2.8 g/dL (calc) (ref 1.9–3.7)
Glucose, Bld: 92 mg/dL (ref 65–99)
Potassium: 4.4 mmol/L (ref 3.5–5.3)
Sodium: 137 mmol/L (ref 135–146)
Total Bilirubin: 1 mg/dL (ref 0.2–1.2)
Total Protein: 6.9 g/dL (ref 6.1–8.1)

## 2020-06-18 LAB — PSA, TOTAL AND FREE
PSA, % Free: 60 % (calc) (ref >25)
PSA, Free: 0.3 ng/mL
PSA, Total: 0.5 ng/mL (ref <=4.0)

## 2020-06-18 LAB — TSH: TSH: 1.47 mIU/L (ref 0.40–4.50)

## 2020-06-26 ENCOUNTER — Telehealth: Payer: Self-pay

## 2020-06-26 NOTE — Telephone Encounter (Signed)
(  Key: [id number])  Your information has been submitted to [facility]. To check for an updated outcome later, reopen this PA request from your dashboard.  If [employer] has not responded to your request within 24 hours, contact [place] at [phone]. If you think there may be a problem with your PA request, use our live chat feature at the bottom right.

## 2020-09-29 ENCOUNTER — Other Ambulatory Visit: Payer: Self-pay | Admitting: Internal Medicine

## 2020-11-13 ENCOUNTER — Ambulatory Visit: Payer: BC Managed Care – PPO | Attending: Internal Medicine

## 2020-11-13 DIAGNOSIS — Z23 Encounter for immunization: Secondary | ICD-10-CM

## 2020-11-13 NOTE — Progress Notes (Signed)
   Covid-19 Vaccination Clinic  Name:  Hunter Edwards    MRN: [id number] DOB: [birth year]  11/13/2020  Hunter Edwards was observed post Covid-19 immunization for 15 minutes without incident. He was provided with Vaccine Information Sheet and instruction to access the V-Safe system.   Hunter Edwards was instructed to call 911 with any severe reactions post vaccine: Marland Kitchen Difficulty breathing  . Swelling of face and throat  . A fast heartbeat  . A bad rash all over body  . Dizziness and weakness   Immunizations Administered    Name Date Dose VIS Date Route   Pfizer COVID-19 Vaccine 11/13/2020  3:40 PM 0.3 mL 09/03/2020 Intramuscular   Manufacturer: [place]   Lot: [id number]   [place]: [address]-8

## 2020-11-20 ENCOUNTER — Ambulatory Visit: Payer: BC Managed Care – PPO

## 2021-02-18 NOTE — Telephone Encounter (Signed)
I have contacted patient and offered an appointment for tomorrow, 02/19/21 at 930. Patient states he cannot come for this since he works 3rd shift. He has instead been scheduled for an appointment on 03/16/21 at 2:30 pm and is agreeable to this plan.

## 2021-02-18 NOTE — Telephone Encounter (Signed)
Patient notified me he has stopped his IBD therapy (humira) He needs an OV, nonurgent, with me JMP

## 2021-02-19 ENCOUNTER — Emergency Department (HOSPITAL_COMMUNITY): Payer: BC Managed Care – PPO

## 2021-02-19 ENCOUNTER — Other Ambulatory Visit: Payer: Self-pay

## 2021-02-19 ENCOUNTER — Encounter (HOSPITAL_COMMUNITY): Payer: Self-pay

## 2021-02-19 ENCOUNTER — Emergency Department (HOSPITAL_COMMUNITY)
Admission: EM | Admit: 2021-02-19 | Discharge: 2021-02-19 | Disposition: A | Discharge status: Home / Self Care | Payer: BC Managed Care – PPO | Attending: Emergency Medicine | Admitting: Emergency Medicine

## 2021-02-19 DIAGNOSIS — I10 Essential (primary) hypertension: Secondary | ICD-10-CM | POA: Insufficient documentation

## 2021-02-19 DIAGNOSIS — Z79899 Other long term (current) drug therapy: Secondary | ICD-10-CM | POA: Insufficient documentation

## 2021-02-19 DIAGNOSIS — R002 Palpitations: Secondary | ICD-10-CM | POA: Insufficient documentation

## 2021-02-19 DIAGNOSIS — Z87891 Personal history of nicotine dependence: Secondary | ICD-10-CM | POA: Insufficient documentation

## 2021-02-19 DIAGNOSIS — Z7982 Long term (current) use of aspirin: Secondary | ICD-10-CM | POA: Insufficient documentation

## 2021-02-19 LAB — CBC WITH DIFFERENTIAL/PLATELET
Abs Immature Granulocytes: 0.04 10*3/uL (ref 0.00–0.07)
Basophils Absolute: 0.1 10*3/uL (ref 0.0–0.1)
Basophils Relative: 1 %
Eosinophils Absolute: 0.1 10*3/uL (ref 0.0–0.5)
Eosinophils Relative: 2 %
HCT: 43.7 % (ref 39.0–52.0)
Hemoglobin: 15 g/dL (ref 13.0–17.0)
Immature Granulocytes: 0 %
Lymphocytes Relative: 28 %
Lymphs Abs: 2.6 10*3/uL (ref 0.7–4.0)
MCH: 30.6 pg (ref 26.0–34.0)
MCHC: 34.3 g/dL (ref 30.0–36.0)
MCV: 89.2 fL (ref 80.0–100.0)
Monocytes Absolute: 0.8 10*3/uL (ref 0.1–1.0)
Monocytes Relative: 9 %
Neutro Abs: 5.8 10*3/uL (ref 1.7–7.7)
Neutrophils Relative %: 60 %
Platelets: 280 10*3/uL (ref 150–400)
RBC: 4.9 MIL/uL (ref 4.22–5.81)
RDW: 12.8 % (ref 11.5–15.5)
WBC: 9.4 10*3/uL (ref 4.0–10.5)
nRBC: 0 % (ref 0.0–0.2)

## 2021-02-19 LAB — COMPREHENSIVE METABOLIC PANEL
ALT: 24 U/L (ref 0–44)
AST: 27 U/L (ref 15–41)
Albumin: 4.2 g/dL (ref 3.5–5.0)
Alkaline Phosphatase: 72 U/L (ref 38–126)
Anion gap: 9 (ref 5–15)
BUN: 18 mg/dL (ref 8–23)
CO2: 27 mmol/L (ref 22–32)
Calcium: 9.4 mg/dL (ref 8.9–10.3)
Chloride: 101 mmol/L (ref 98–111)
Creatinine, Ser: 1.35 mg/dL — ABNORMAL HIGH (ref 0.61–1.24)
GFR, Estimated: 58 mL/min — ABNORMAL LOW (ref >60)
Glucose, Bld: 92 mg/dL (ref 70–99)
Potassium: 3.7 mmol/L (ref 3.5–5.1)
Sodium: 137 mmol/L (ref 135–145)
Total Bilirubin: 1.3 mg/dL — ABNORMAL HIGH (ref 0.3–1.2)
Total Protein: 7.8 g/dL (ref 6.5–8.1)

## 2021-02-19 LAB — TROPONIN I (HIGH SENSITIVITY): Troponin I (High Sensitivity): 5 ng/L (ref <18)

## 2021-02-19 NOTE — Discharge Instructions (Addendum)
Follow-up with your cardiologist in the next couple weeks.  Return if any problems

## 2021-02-19 NOTE — ED Triage Notes (Signed)
Pt presents to ED, states feels like his heart started fluttering this am. Pt states he has been a little dizzy also and c/o indigestion. Pt with hx of a fib.

## 2021-02-19 NOTE — ED Provider Notes (Signed)
[facility] EMERGENCY DEPARTMENT Provider Note   CSN: [id number] Arrival date & time: 02/19/21  1146     History Chief Complaint  Patient presents with  . Palpitations    Hunter Edwards is a 65 y.o. male.  Patient complains of palpitations.  He had no chest pain no shortness of breath.  The palpitations have improved  The history is provided by the patient and medical records. No language interpreter was used.  Palpitations Palpitations quality:  Regular Onset quality:  Sudden Timing:  Sporadic Progression:  Resolved Context: anxiety   Relieved by:  Nothing Worsened by:  Nothing Ineffective treatments:  None tried Associated symptoms: no back pain, no chest pain and no cough        Past Medical History:  Diagnosis Date  . Anal fistula   . Anorectal fistula   . Atrial fibrillation ([place]) 2016  . Benign hypertension   . Cataract   . Chest pain   . Colitis   . Contact dermatitis   . Crohn's disease ([place]) 2011  . Disease of jaw   . Dysrhythmia 01/14/15   AFib  . Fatigue   . Folliculitis   . GERD (gastroesophageal reflux disease)   . History of Holter monitoring    Will be done on Friday, 01/17/15  . Hyperlipidemia   . IDA (iron deficiency anemia)   . Need for Tdap vaccination   . Neoplasm of prostate   . Palpitations   . Perirectal abscess   . Rash   . Rectal discharge     Patient Active Problem List   Diagnosis Date Noted  . Hyperlipidemia 06/18/2020  . ED (erectile dysfunction) 06/17/2020  . Prediabetes 06/16/2020  . Rash and nonspecific skin eruption 05/29/2018  . Muscle cramp 04/14/2018  . Essential hypertension 06/11/2015  . Paroxysmal atrial fibrillation ([place]) 05/07/2015  . GERD (gastroesophageal reflux disease) 05/07/2015  . Crohn disease ([place]) 05/07/2015    Past Surgical History:  Procedure Laterality Date  . ANAL FISTULECTOMY N/A 01/24/2015   Procedure: FISTULECTOMY ANAL;  Surgeon: [name], MD;  Location: [facility];  Service: General;  Laterality: N/A;  . RECTAL EXAM UNDER ANESTHESIA N/A 01/24/2015   Procedure: ANAL  EXAM UNDER ANESTHESIA;  Surgeon: [name], MD;  Location: [place];  Service: General;  Laterality: N/A;  . TONSILLECTOMY  1962       Family History  Problem Relation Age of Onset  . Stomach cancer Father        died in his 12's  . Diabetes Brother   . Heart disease Brother   . Colon cancer Neg Hx   . Colon polyps Neg Hx   . Kidney disease Neg Hx   . Esophageal cancer Neg Hx   . Gallbladder disease Neg Hx     Social History   Tobacco Use  . Smoking status: Former Smoker    Packs/day: 1.00    Types: Cigarettes    Quit date: 11/15/1996    Years since quitting: 24.2  . Smokeless tobacco: Never Used  Vaping Use  . Vaping Use: Never used  Substance Use Topics  . Alcohol use: No    Alcohol/week: 0.0 standard drinks    Comment: Rarely  . Drug use: No    Home Medications Prior to Admission medications   Medication Sig Start Date End Date Taking? Authorizing Provider  aspirin 81 MG tablet Take 81 mg by mouth daily.   Yes [provider]  Raelyn Ensign  Pollen 550 MG CAPS Take 2 capsules by mouth daily.   Yes [provider]  losartan (COZAAR) 25 MG tablet Take 1 tablet by mouth daily. 03/17/17  Yes [provider]  MAGNESIUM PO Take 1 tablet by mouth daily. Along with potassium   Yes [provider]  pantoprazole (PROTONIX) 40 MG tablet TAKE 1 TABLET BY MOUTH EVERY DAY Patient taking differently: Take 40 mg by mouth daily. 09/29/20  Yes Burns, Claudina Lick, MD  POTASSIUM PO Take 1 tablet by mouth daily. Along with magnesium   Yes [provider]  Adalimumab (HUMIRA PEN) 40 MG/0.4ML PNKT Inject 40 mg into the skin every 14 (fourteen) days. Patient not taking: Reported on 02/19/2021 07/09/19   Jerene Bears, MD  sildenafil (VIAGRA) 100 MG tablet Take 0.5-1 tablets (50-100 mg total) by mouth daily as needed for erectile  dysfunction. Patient not taking: Reported on 02/19/2021 06/17/20   Binnie Rail, MD    Allergies    Statins, Sulfa antibiotics, and Sulfamethoxazole  Review of Systems   Review of Systems  Constitutional: Negative for appetite change and fatigue.  HENT: Negative for congestion, ear discharge and sinus pressure.   Eyes: Negative for discharge.  Respiratory: Negative for cough.   Cardiovascular: Positive for palpitations. Negative for chest pain.  Gastrointestinal: Negative for abdominal pain and diarrhea.  Genitourinary: Negative for frequency and hematuria.  Musculoskeletal: Negative for back pain.  Skin: Negative for rash.  Neurological: Negative for seizures and headaches.  Psychiatric/Behavioral: Negative for hallucinations.    Physical Exam Updated Vital Signs BP 133/89   Pulse 70   Temp 97.9 F (36.6 C) (Oral)   Resp 15   Ht 5' 6"  (1.676 m)   Wt 83.9 kg   SpO2 98%   BMI 29.86 kg/m   Physical Exam Vitals and nursing note reviewed.  Constitutional:      Appearance: He is well-developed.  HENT:     Head: Normocephalic.  Eyes:     General: No scleral icterus.    Conjunctiva/sclera: Conjunctivae normal.  Neck:     Thyroid: No thyromegaly.  Cardiovascular:     Rate and Rhythm: Normal rate and regular rhythm.     Heart sounds: No murmur heard. No friction rub. No gallop.   Pulmonary:     Breath sounds: No stridor. No wheezing or rales.  Chest:     Chest wall: No tenderness.  Abdominal:     General: There is no distension.     Tenderness: There is no abdominal tenderness. There is no rebound.  Musculoskeletal:        General: Normal range of motion.     Cervical back: Neck supple.  Lymphadenopathy:     Cervical: No cervical adenopathy.  Skin:    Findings: No erythema or rash.  Neurological:     Mental Status: He is alert and oriented to person, place, and time.     Motor: No abnormal muscle tone.     Coordination: Coordination normal.  Psychiatric:         Behavior: Behavior normal.     ED Results / Procedures / Treatments   Labs (all labs ordered are listed, but only abnormal results are displayed) Labs Reviewed  COMPREHENSIVE METABOLIC PANEL - Abnormal; Notable for the following components:      Result Value   Creatinine, Ser 1.35 (*)    Total Bilirubin 1.3 (*)    GFR, Estimated 58 (*)    All other components within normal  limits  CBC WITH DIFFERENTIAL/PLATELET  TROPONIN I (HIGH SENSITIVITY)  TROPONIN I (HIGH SENSITIVITY)    EKG None  Radiology DG Chest Port 1 View  Result Date: 02/19/2021 CLINICAL DATA:  Chest pain with cardiac palpitations EXAM: PORTABLE CHEST 1 VIEW COMPARISON:  November 25, 2019 FINDINGS: There is minimal left base atelectasis. Lungs elsewhere are clear. Heart size and pulmonary vascularity are normal. No adenopathy. No pneumothorax. No bone lesions. IMPRESSION: Slight left base atelectasis. Lungs elsewhere clear. Heart size normal. Electronically Signed   By: Lowella Grip III M.D.   On: 02/19/2021 12:50    Procedures Procedures   Medications Ordered in ED Medications - No data to display  ED Course  I have reviewed the triage vital signs and the nursing notes.  Pertinent labs & imaging results that were available during my care of the patient were reviewed by me and considered in my medical decision making (see chart for details).    MDM Rules/Calculators/A&P                          Troponin lab test EKG chest x-ray all unremarkable.  Patient has had no palpitations while he is here.  He is discharged home to follow-up with cardiologist and return if problem Final Clinical Impression(s) / ED Diagnoses Final diagnoses:  Palpitations    Rx / DC Orders ED Discharge Orders    None       Milton Ferguson, MD 02/20/21 1407

## 2021-03-16 ENCOUNTER — Other Ambulatory Visit: Payer: Self-pay | Admitting: Internal Medicine

## 2021-03-16 ENCOUNTER — Ambulatory Visit: Payer: BC Managed Care – PPO | Admitting: Internal Medicine

## 2021-03-16 ENCOUNTER — Encounter: Payer: Self-pay | Admitting: Internal Medicine

## 2021-03-16 ENCOUNTER — Other Ambulatory Visit (INDEPENDENT_AMBULATORY_CARE_PROVIDER_SITE_OTHER): Payer: BC Managed Care – PPO

## 2021-03-16 VITALS — BP 144/84 | HR 78 | Ht 65.5 in | Wt 189.0 lb

## 2021-03-16 DIAGNOSIS — K219 Gastro-esophageal reflux disease without esophagitis: Secondary | ICD-10-CM

## 2021-03-16 DIAGNOSIS — K50119 Crohn's disease of large intestine with unspecified complications: Secondary | ICD-10-CM

## 2021-03-16 DIAGNOSIS — K5010 Crohn's disease of large intestine without complications: Secondary | ICD-10-CM | POA: Insufficient documentation

## 2021-03-16 LAB — CBC WITH DIFFERENTIAL/PLATELET
Basophils Absolute: 0.1 10*3/uL (ref 0.0–0.1)
Basophils Relative: 0.8 % (ref 0.0–3.0)
Eosinophils Absolute: 0.1 10*3/uL (ref 0.0–0.7)
Eosinophils Relative: 1.1 % (ref 0.0–5.0)
HCT: 41.8 % (ref 39.0–52.0)
Hemoglobin: 14.4 g/dL (ref 13.0–17.0)
Lymphocytes Relative: 19.6 % (ref 12.0–46.0)
Lymphs Abs: 2.1 10*3/uL (ref 0.7–4.0)
MCHC: 34.4 g/dL (ref 30.0–36.0)
MCV: 87.9 fl (ref 78.0–100.0)
Monocytes Absolute: 0.8 10*3/uL (ref 0.1–1.0)
Monocytes Relative: 8 % (ref 3.0–12.0)
Neutro Abs: 7.4 10*3/uL (ref 1.4–7.7)
Neutrophils Relative %: 70.5 % (ref 43.0–77.0)
Platelets: 240 10*3/uL (ref 150.0–400.0)
RBC: 4.75 Mil/uL (ref 4.22–5.81)
RDW: 13.7 % (ref 11.5–15.5)
WBC: 10.5 10*3/uL (ref 4.0–10.5)

## 2021-03-16 LAB — IBC + FERRITIN
Ferritin: 25.8 ng/mL (ref 22.0–322.0)
Iron: 58 ug/dL (ref 42–165)
Saturation Ratios: 16.9 % — ABNORMAL LOW (ref 20.0–50.0)
Transferrin: 245 mg/dL (ref 212.0–360.0)

## 2021-03-16 MED ORDER — PANTOPRAZOLE SODIUM 40 MG PO TBEC: 1.0000 | DELAYED_RELEASE_TABLET | Freq: Two times a day (BID) | ORAL | 0 refills | Status: DC

## 2021-03-16 NOTE — Progress Notes (Signed)
Subjective:    Patient ID: Hunter Edwards, male    DOB: 03-07-1956, 65 y.o.   MRN: [id number]  HPI The patient is here for an acute visit.   Skin irritation in the perineum area:  Noticed it about one week ago.  It was tender if he sits wrong.   The area is red.   No injury.  It does not itch.  No blisters.  He also has a line/ irritation on top of distal penile shaft that at times opens and bleeds.  He puts lotion on it and it heals, but opens again.    He does have an oral ulcer.    Lower back pain - he woke up yesterday with pain in his right lower back.  Intermittent depending on movement - feels like spasms.    Had to hold humira due to side effect after covid booster. To start entyvio next week.     Medications and allergies reviewed with patient and updated if appropriate.  Patient Active Problem List   Diagnosis Date Noted  . Crohn's disease of colon with complication ([place]) 59/93/5701  . Hyperlipidemia 06/18/2020  . ED (erectile dysfunction) 06/17/2020  . Prediabetes 06/16/2020  . Rash and nonspecific skin eruption 05/29/2018  . Muscle cramp 04/14/2018  . Essential hypertension 06/11/2015  . Paroxysmal atrial fibrillation ([place]) 05/07/2015  . GERD (gastroesophageal reflux disease) 05/07/2015  . Crohn disease ([place]) 05/07/2015    Current Outpatient Medications on File Prior to Visit  Medication Sig Dispense Refill  . aspirin 81 MG tablet Take 81 mg by mouth daily.    Raelyn Ensign Pollen 550 MG CAPS Take 2 capsules by mouth daily.    Marland Kitchen losartan (COZAAR) 25 MG tablet Take 1 tablet by mouth daily.    Marland Kitchen MAGNESIUM PO Take 1 tablet by mouth daily. Along with potassium    . pantoprazole (PROTONIX) 40 MG tablet Take 1 tablet (40 mg total) by mouth 2 (two) times daily before a meal. 60 tablet 0  . POTASSIUM PO Take 1 tablet by mouth daily. Along with magnesium    . sildenafil (VIAGRA) 100 MG tablet Take 0.5-1 tablets (50-100 mg total) by mouth daily as needed for erectile  dysfunction. 5 tablet 11   No current facility-administered medications on file prior to visit.    Past Medical History:  Diagnosis Date  . Anal fistula   . Anorectal fistula   . Atrial fibrillation ([place]) 2016  . Benign hypertension   . Cataract   . Chest pain   . Colitis   . Contact dermatitis   . Crohn's disease ([place]) 2011  . Disease of jaw   . Dysrhythmia 01/14/15   AFib  . Fatigue   . Folliculitis   . GERD (gastroesophageal reflux disease)   . History of Holter monitoring    Will be done on Friday, 01/17/15  . Hyperlipidemia   . IDA (iron deficiency anemia)   . Need for Tdap vaccination   . Neoplasm of prostate   . Palpitations   . Perirectal abscess   . Rash   . Rectal discharge     Past Surgical History:  Procedure Laterality Date  . ANAL FISTULECTOMY N/A 01/24/2015   Procedure: FISTULECTOMY ANAL;  Surgeon: [name], MD;  Location: [facility];  Service: General;  Laterality: N/A;  . RECTAL EXAM UNDER ANESTHESIA N/A 01/24/2015   Procedure: ANAL  EXAM UNDER ANESTHESIA;  Surgeon: [name], MD;  Location: [facility];  Service: General;  Laterality: N/A;  . TONSILLECTOMY  1962    Social History   Socioeconomic History  . Marital status: Married    Spouse name: Not on file  . Number of children: 1  . Years of education: Not on file  . Highest education level: Not on file  Occupational History  . Occupation: Truck Geophysicist/field seismologist  Tobacco Use  . Smoking status: Former Smoker    Packs/day: 1.00    Types: Cigarettes    Quit date: 11/15/1996    Years since quitting: 24.3  . Smokeless tobacco: Never Used  Vaping Use  . Vaping Use: Never used  Substance and Sexual Activity  . Alcohol use: No    Alcohol/week: 0.0 standard drinks    Comment: Rarely  . Drug use: No  . Sexual activity: Not on file  Other Topics Concern  . Not on file  Social History Narrative  . Not on file   Social Determinants of Health   Financial Resource  Strain: Not on file  Food Insecurity: Not on file  Transportation Needs: Not on file  Physical Activity: Not on file  Stress: Not on file  Social Connections: Not on file    Family History  Problem Relation Age of Onset  . Stomach cancer Father        died in his 44's  . Diabetes Brother   . Heart disease Brother   . Colon cancer Neg Hx   . Colon polyps Neg Hx   . Kidney disease Neg Hx   . Esophageal cancer Neg Hx   . Gallbladder disease Neg Hx     Review of Systems     Objective:   Vitals:   03/17/21 1402  BP: (!) 142/90  Pulse: 94  Temp: 98.4 F (36.9 C)  SpO2: 96%   BP Readings from Last 3 Encounters:  03/17/21 (!) 142/90  03/16/21 (!) 144/84  02/19/21 (!) 142/83   Wt Readings from Last 3 Encounters:  03/17/21 190 lb (86.2 kg)  03/16/21 189 lb (85.7 kg)  02/19/21 185 lb (83.9 kg)   Body mass index is 31.14 kg/m.   Physical Exam Constitutional:      General: He is not in acute distress.    Appearance: Normal appearance. He is not ill-appearing.  HENT:     Head: Normocephalic and atraumatic.     Nose:     Comments: Oral ulcer right lower anterior cheek. Skin:    General: Skin is warm and dry.     Comments: Perineum region - skin irritation with breaks in skin, no true ulcers.   Slight line across distal top of penile shaft with right side with slight skin opening w/o discharge, no other penile lesions  Neurological:     Mental Status: He is alert.            Assessment & Plan:    See Problem List for Assessment and Plan of chronic medical problems.    This visit occurred during the SARS-CoV-2 public health emergency.  Safety protocols were in place, including screening questions prior to the visit, additional usage of staff PPE, and extensive cleaning of exam room while observing appropriate contact time as indicated for disinfecting solutions.

## 2021-03-16 NOTE — Progress Notes (Signed)
Subjective:    Patient ID: Hunter Edwards, male    DOB: 1955/12/05, 65 y.o.   MRN: [id number]  HPI Hunter Edwards is a 65 year old male with a history of Crohn's colitis with perianal involvement and prior fistula (diagnosis 2011), GERD, history of steroid responsive dermatitis, A. fib with history of ablation, hyperlipidemia, hypertension who is here for follow-up.  He is here alone today and I last saw him for his surveillance colonoscopy in May 2021.  He had been maintained on Humira since his colonoscopy showed severe left-sided Crohn's disease and July 2020.  His last colonoscopy again was 1 year ago which revealed normal terminal ileum.  5 mm transverse polyp which was removed with cold snare and found to be an adenoma.  He had a single rectal ulceration of the distal rectum but otherwise no evidence of active Crohn's disease.  He had diverticulosis from the asceding to descending colon.  Internal hemorrhoids seen as well.  Pathology from the right colon was benign, left colon benign, rectal ulcer with mild active inflammation but no dysplasia.  He has been doing very well from a clinical standpoint on Humira until January 2022.  He reports he took [employer] booster vaccination on 11/13/2021 and thereafter he started noticing significant palpitations 15 minutes after Humira injection.  He reports that initially it started with a racing heartbeat reminiscent to prior atrial fibrillation which calm down about 15 minutes to 1 hour after injection.  This persisted and worsened with more significant palpitations lasting for a longer period of time with each subsequent Humira injection through January and February 2022.  He stopped the medication on March 1 after palpitations lasted nearly 3 days.  He has had no subsequent palpitation episodes since.  Currently he feels that his Crohn's is in control.  He has scant blood with wiping which he attributes to hemorrhoids.  He has 1 bowel movement a day  without mucus, abdominal pain or diarrhea.  From a reflux perspective despite pantoprazole which was previously working well he has noticed more frequent heartburn.  He also states this started after [employer] vaccination in December.  He is using Maalox or Mylanta on a regular basis.  He is also noticed more throat clearing.  He is not having dysphagia.  He has cardiology follow-up with one of the physicians assistants in June and he will see Hunter Edwards (his electrophysiologist) later in the year.   Review of Systems As per HPI, otherwise negative  Current Medications, Allergies, Past Medical History, Past Surgical History, Family History and Social History were reviewed in [employer] record.     Objective:   Physical Exam BP (!) 144/84   Pulse 78   Ht 5' 5.5" (1.664 m)   Wt 189 lb (85.7 kg)   BMI 30.97 kg/m  Gen: awake, alert, NAD HEENT: anicteric CV: RRR, no mrg Abd: soft, NT/ND, +BS throughout Ext: no c/c/e Neuro: nonfocal      Assessment & Plan:  65 year old male with a history of Crohn's colitis with perianal involvement and prior fistula (diagnosis 2011), GERD, history of steroid responsive dermatitis, A. fib with history of ablation, hyperlipidemia, hypertension who is here for follow-up.   1.  Crohn's colitis --he had responded very favorably to Humira but has developed an injection reaction.  He states this occurred after [employer] vaccination.  We discussed how I do not have any hard evidence that the 2 are related, it is hard to argue against as  well.  Either way I am not comfortable continuing with Humira given his persistent side effects immediately after injection.  He has been off of Crohn's therapy for about 2 months and I have recommended that we resume therapy soon with a new agent to avoid flare and further complication.  We spent time today discussing both Entyvio and Stelara, the risks, benefits of these medications.  After our discussion  we will begin Entyvio with standard induction -- Stop Humira -- Entyvio with standard induction; I will send this to the [facility] to start ASAP -- CBC, CMP, quantiferon gold, hepatitis B serology and iron studies -- I recommend colonoscopy probably in about a year for assessment of disease activity and surveillance -- I would like to see him in 3 months  2.  GERD --worsening recently despite once daily pantoprazole.  I recommended the following: -- Increase pantoprazole to 40 mg twice daily for 1 month; I asked him to notify me if symptoms fail to improve and if they do not improve and return to controlled on once daily therapy I recommend upper endoscopy

## 2021-03-16 NOTE — Patient Instructions (Addendum)
Discontinue Humira.  We will be sending orders to get you started on Entyvio.  We have sent the following medications to your pharmacy for you to pick up at your convenience: Pantoprazole 40 mg twice daily x 1 month (increase from previous dosage)  Call our office in 1 month to let us know if your reflux has gotten better on the increased dosage of pantoprazole.  Your provider has requested that you go to the basement level for lab work before leaving today. Press "B" on the elevator. The lab is located at the first door on the left as you exit the elevator.  Please follow up with Dr Hilarie Fredrickson in 3 months.  If you are age 66 or older, your body mass index should be between 23-30. Your Body mass index is 30.97 kg/m. If this is out of the aforementioned range listed, please consider follow up with your Primary Care Provider.  If you are age 10 or younger, your body mass index should be between 19-25. Your Body mass index is 30.97 kg/m. If this is out of the aformentioned range listed, please consider follow up with your Primary Care Provider.   Due to recent changes in healthcare laws, you may see the results of your imaging and laboratory studies on MyChart before your provider has had a chance to review them.  We understand that in some cases there may be results that are confusing or concerning to you. Not all laboratory results come back in the same time frame and the provider may be waiting for multiple results in order to interpret others.  Please give [place] 48 hours in order for your provider to thoroughly review all the results before contacting the office for clarification of your results.

## 2021-03-17 ENCOUNTER — Ambulatory Visit: Payer: BC Managed Care – PPO | Admitting: Internal Medicine

## 2021-03-17 ENCOUNTER — Other Ambulatory Visit: Payer: Self-pay

## 2021-03-17 ENCOUNTER — Encounter: Payer: Self-pay | Admitting: Internal Medicine

## 2021-03-17 ENCOUNTER — Telehealth: Payer: Self-pay | Admitting: Pharmacy Technician

## 2021-03-17 DIAGNOSIS — R238 Other skin changes: Secondary | ICD-10-CM | POA: Diagnosis not present

## 2021-03-17 DIAGNOSIS — K121 Other forms of stomatitis: Secondary | ICD-10-CM | POA: Diagnosis not present

## 2021-03-17 MED ORDER — TRIAMCINOLONE ACETONIDE 0.1 % MT PSTE: 1.0000 "application " | PASTE | Freq: Two times a day (BID) | OROMUCOSAL | 12 refills | Status: DC

## 2021-03-17 NOTE — Patient Instructions (Addendum)
Start zinc 11 mg - 40 mg daily    Apply the triamcinolone paste to your affected areas.       Please call if there is no improvement in your symptoms.

## 2021-03-17 NOTE — Assessment & Plan Note (Signed)
Acute ? Related to crohn's and other skin irritation Triamcinolone paste 0.1%

## 2021-03-17 NOTE — Assessment & Plan Note (Signed)
Acute  In perineum and top of distal penile shaft ? Related to crohn's, also with oral ulcer Trial of triamcinolone 0.1% paste to all areas Start oral zinc Call if no improvement

## 2021-03-17 NOTE — Telephone Encounter (Signed)
Auth Submission: NO AUTH REQUIRED Payer: [employer] ([employer]) Medication & CPT/J Code(s) submitted: Entyvio (Vedolizumab) [id number] Route of submission (phone, fax, portal): [phone] type of PA: Buy/Bill Units/visits requested: 9 Reference number: [id number]  No PA required. Pre-determination recommended. Clinical faxed: [phone]

## 2021-03-18 LAB — HEPATITIS B SURFACE ANTIGEN: Hepatitis B Surface Ag: NONREACTIVE

## 2021-03-18 LAB — QUANTIFERON-TB GOLD PLUS
Mitogen-NIL: >10 IU/mL
NIL: 0.01 IU/mL
QuantiFERON-TB Gold Plus: NEGATIVE
TB1-NIL: 0.01 IU/mL
TB2-NIL: 0.01 IU/mL

## 2021-03-20 NOTE — Telephone Encounter (Signed)
error 

## 2021-03-24 ENCOUNTER — Ambulatory Visit (INDEPENDENT_AMBULATORY_CARE_PROVIDER_SITE_OTHER): Payer: BC Managed Care – PPO

## 2021-03-24 ENCOUNTER — Other Ambulatory Visit: Payer: Self-pay

## 2021-03-24 VITALS — BP 139/87 | HR 72 | Temp 97.9°F | Resp 18

## 2021-03-24 DIAGNOSIS — K50119 Crohn's disease of large intestine with unspecified complications: Secondary | ICD-10-CM | POA: Diagnosis not present

## 2021-03-24 MED ORDER — VEDOLIZUMAB 300 MG IV SOLR
300.0000 mg | Freq: Once | INTRAVENOUS | Status: AC
  Administered 2021-03-24: 300 mg via INTRAVENOUS
  Filled 2021-03-24: qty 5

## 2021-03-24 NOTE — Progress Notes (Signed)
Diagnosis: Crohn's Disease  Provider:  Marshell Garfinkel, MD  Procedure: Infusion  IV Type: Peripheral, IV Location: R Forearm  Entyvio (Vedolizumab), Dose: 300 mg  Infusion Start Time: 6466  Infusion Stop Time: 0563  Post Infusion IV Care: Observation period completed and Peripheral IV Discontinued.post infusion observation 30 minutes no side effects noted.v/s obtained   Discharge: Condition: Good, Destination: Home . AVS provided to patient.   Performed by:  Arnoldo Morale, RN

## 2021-03-27 ENCOUNTER — Telehealth: Payer: Self-pay

## 2021-03-27 NOTE — Telephone Encounter (Signed)
Rash can happen with Entyvio Not sure about the eyes  Plan: -Please take Benadryl 50 mg every 8 hours x 24 hrs, then PRN -Can we set him up with an ophthalmologist to make sure he does not have any iritis/episcleritis. -Also needs to be seen in APP clinic next week -If still with allergic reaction, may need Solu-Medrol followed by Medrol Dosepak  RG

## 2021-04-03 ENCOUNTER — Ambulatory Visit: Payer: BC Managed Care – PPO | Admitting: Gastroenterology

## 2021-04-06 ENCOUNTER — Encounter: Payer: Self-pay | Admitting: Gastroenterology

## 2021-04-06 ENCOUNTER — Ambulatory Visit: Payer: BC Managed Care – PPO | Admitting: Gastroenterology

## 2021-04-06 VITALS — BP 150/90 | HR 80 | Ht 65.5 in | Wt 188.0 lb

## 2021-04-06 DIAGNOSIS — K50119 Crohn's disease of large intestine with unspecified complications: Secondary | ICD-10-CM | POA: Diagnosis not present

## 2021-04-06 NOTE — Patient Instructions (Signed)
If you are age 66 or older, your body mass index should be between 23-30. Your Body mass index is 30.81 kg/m. If this is out of the aforementioned range listed, please consider follow up with your Primary Care Provider.  If you are age 73 or younger, your body mass index should be between 19-25. Your Body mass index is 30.81 kg/m. If this is out of the aformentioned range listed, please consider follow up with your Primary Care Provider.   Get Entyvio infusion tomorrow.  Follow up with eye doctor.

## 2021-04-06 NOTE — Progress Notes (Signed)
04/06/2021 Hunter Edwards [id number] 04/05/1956   HISTORY OF PRESENT ILLNESS: This is a 65 year old male who is a patient of Dr. Vena Rua.  He has a history of Crohn's colitis with perianal involvement and prior fistula (diagnosis 2011), GERD, history of steroid responsive dermatitis, A. fib with history of ablation, hypertension, hyperlipidemia.  Please see Dr. Vena Rua note from Mar 16, 2021 in regards to his history leading up to this point.  Essentially he had done well on Humira, but then began having palpitations/reactions after his injection that began occurring after getting his [place] COVID-vaccine.  That was discontinued and he was just started on Entyvio, receiving his first infusion on May 12.  Then on May 13 he noticed an itchy skin rash began popping up on his arms, legs, chest, and back.  He also noted some blurry vision and has been having some drainage from his right eye.  He has experienced this rash in the past and has been worked up by dermatology and it had been thought to be from his Crohn's disease.  He says that the rash has been under control until receiving the Entyvio infusion.  He is due for his next infusion tomorrow.  He does have an ophthalmologist but he has not seen him in a few years.      Past Medical History:  Diagnosis Date  . Anal fistula   . Anorectal fistula   . Atrial fibrillation ([place]) 2016  . Benign hypertension   . Cataract   . Chest pain   . Colitis   . Contact dermatitis   . Crohn's disease ([place]) 2011  . Disease of jaw   . Dysrhythmia 01/14/15   AFib  . Fatigue   . Folliculitis   . GERD (gastroesophageal reflux disease)   . History of Holter monitoring    Will be done on Friday, 01/17/15  . Hyperlipidemia   . IDA (iron deficiency anemia)   . Need for Tdap vaccination   . Neoplasm of prostate   . Palpitations   . Perirectal abscess   . Rash   . Rectal discharge    Past Surgical History:  Procedure Laterality Date  . ANAL  FISTULECTOMY N/A 01/24/2015   Procedure: FISTULECTOMY ANAL;  Surgeon: [name], MD;  Location: [facility];  Service: General;  Laterality: N/A;  . RECTAL EXAM UNDER ANESTHESIA N/A 01/24/2015   Procedure: ANAL  EXAM UNDER ANESTHESIA;  Surgeon: [name], MD;  Location: [facility];  Service: General;  Laterality: N/A;  . TONSILLECTOMY  1962    reports that he quit smoking about 24 years ago. His smoking use included cigarettes. He smoked 1.00 pack per day. He has never used smokeless tobacco. He reports that he does not drink alcohol and does not use drugs. family history includes Diabetes in his brother; Heart disease in his brother; Stomach cancer in his father. Allergies  Allergen Reactions  . Statins Other (See Comments)    Muscle cramp/weakness/aches   . Sulfa Antibiotics Other (See Comments)    Unspecified  . Sulfamethoxazole Other (See Comments)    Unsure of reaction      Outpatient Encounter Medications as of 04/06/2021  Medication Sig  . aspirin 81 MG tablet Take 81 mg by mouth daily.  Raelyn Ensign Pollen 550 MG CAPS Take 2 capsules by mouth daily.  Marland Kitchen losartan (COZAAR) 25 MG tablet Take 1 tablet by mouth daily.  Marland Kitchen MAGNESIUM PO Take 1 tablet by mouth  daily. Along with potassium  . pantoprazole (PROTONIX) 40 MG tablet Take 1 tablet (40 mg total) by mouth 2 (two) times daily before a meal.  . POTASSIUM PO Take 1 tablet by mouth daily. Along with magnesium  . sildenafil (VIAGRA) 100 MG tablet Take 0.5-1 tablets (50-100 mg total) by mouth daily as needed for erectile dysfunction.  . triamcinolone (KENALOG) 0.1 % paste Use as directed 1 application in the mouth or throat 2 (two) times daily.   No facility-administered encounter medications on file as of 04/06/2021.     REVIEW OF SYSTEMS  : All other systems reviewed and negative except where noted in the History of Present Illness.   PHYSICAL EXAM: BP (!) 150/90   Pulse 80   Ht 5' 5.5" (1.664 m)    Wt 188 lb (85.3 kg)   SpO2 98%   BMI 30.81 kg/m  General: Well developed white male in no acute distress Head: Normocephalic and atraumatic Eyes:  Sclerae anicteric, conjunctiva pink. Ears: Normal auditory acuity Lungs: Clear throughout to auscultation; no W/R/R. Heart: Regular rate and rhythm; no M/R/G. Abdomen: Soft, non-distended.  BS present.  Non-tender. Musculoskeletal: Symmetrical with no gross deformities  Skin:  Rash/lesions noted on extensor surfaces of his arms, a few on his chest, and a couple on his back.  He reports some on the extensor surfaces of his legs as well. Extremities: No edema  Neurological: Alert oriented x 4, grossly non-focal Psychological:  Alert and cooperative. Normal mood and affect  ASSESSMENT AND PLAN: 65 year old male with a history of Crohn's colitis with perianal involvement and prior fistula (diagnosis 2011), GERD, history of steroid responsive dermatitis, A. fib with history of ablation, hyperlipidemia, hypertension who is here for follow-up.   *Crohn's colitis: Had responded well to Humira but developed an injection reaction after having received his [place] vaccination.  Started Weyman Rodney on 5/12 and on 5/13 started noting rash on the extensor surfaces of his arms and legs as well as his chest and back.  These are the same type of lesions/rash that he had experienced previously that were thought to be related to his Crohn's disease.  He is concerned that the Weyman Rodney has reactivated his Crohn's or caused some other type of disruption.  He also had some blurry vision and some discharge from his right eye.  The blurry vision has improved.  He does have an ophthalmologist, but has not seen him in a few years.  He will make an appointment there to be evaluated rule out iritis, etc.  I explained that a single dose of Entyvio would be very unlikely to have caused a reaction and infusion reactions tend to be very low with Entyvio.  He is reluctant, but  agreeable to receive his next Entyvio infusion as scheduled tomorrow.  This was discussed with Dr. Hilarie Fredrickson.  CC:  Binnie Rail, MD

## 2021-04-07 ENCOUNTER — Other Ambulatory Visit: Payer: Self-pay

## 2021-04-07 ENCOUNTER — Ambulatory Visit (INDEPENDENT_AMBULATORY_CARE_PROVIDER_SITE_OTHER): Payer: BC Managed Care – PPO

## 2021-04-07 VITALS — BP 156/95 | HR 67 | Temp 97.6°F | Resp 18 | Ht 66.5 in | Wt 188.0 lb

## 2021-04-07 DIAGNOSIS — K50119 Crohn's disease of large intestine with unspecified complications: Secondary | ICD-10-CM

## 2021-04-07 MED ORDER — VEDOLIZUMAB 300 MG IV SOLR
300.0000 mg | Freq: Once | INTRAVENOUS | Status: AC
  Administered 2021-04-07: 300 mg via INTRAVENOUS
  Filled 2021-04-07: qty 5

## 2021-04-07 NOTE — Progress Notes (Signed)
Diagnosis: Crohn's Disease  Provider:  Marshell Garfinkel, MD  Procedure: Infusion  IV Type: Peripheral, IV Location: L Antecubital  Entyvio (Vedolizumab), Dose: 300 mg  Infusion Start Time: 1212  Infusion Stop Time: 1310  Post Infusion IV Care: Peripheral IV Discontinued  Discharge: Condition: Good, Destination: Home . AVS provided to patient.   Performed by:  Arnoldo Morale, RN

## 2021-04-16 NOTE — Progress Notes (Signed)
Addendum: Reviewed and agree with assessment and management plan. Agree with continuing Entyvio and I think he needs to reestablish with dermatology if he has not done so. I think this rash is more related to IBD than resulting from therapy Hazley Dezeeuw, Lajuan Lines, MD

## 2021-04-17 ENCOUNTER — Telehealth: Payer: Self-pay

## 2021-04-17 NOTE — Telephone Encounter (Signed)
-----   Message from Loralie Champagne, PA-C sent at 04/17/2021  9:14 AM EDT ----- Please check on the patient and see how he did with his last Entyvio dose.  Also, in regards to the rash, Dr. Hilarie Fredrickson would like him to re-establish with dermatology if he has not been seen by them recently.  Thank you,  Jess  ----- Message ----- From: Jerene Bears, MD Sent: 04/16/2021   5:12 PM EDT To: Loralie Champagne, PA-C    ----- Message ----- From: Loralie Champagne, PA-C Sent: 04/06/2021   5:10 PM EDT To: Jerene Bears, MD

## 2021-04-17 NOTE — Telephone Encounter (Signed)
The pt states he still has the rash but it is getting better. He will follow up with derm.  He will call back if he has any further concerns.

## 2021-05-12 NOTE — Telephone Encounter (Signed)
Pt notified me he is stopping entvyio I need to see him in the office or by video visit JMP

## 2021-05-14 ENCOUNTER — Ambulatory Visit (INDEPENDENT_AMBULATORY_CARE_PROVIDER_SITE_OTHER): Payer: BC Managed Care – PPO | Admitting: Internal Medicine

## 2021-05-14 ENCOUNTER — Other Ambulatory Visit: Payer: Self-pay | Admitting: Internal Medicine

## 2021-05-14 ENCOUNTER — Encounter: Payer: Self-pay | Admitting: Internal Medicine

## 2021-05-14 VITALS — BP 128/70 | HR 91 | Ht 67.0 in | Wt 185.6 lb

## 2021-05-14 DIAGNOSIS — K5010 Crohn's disease of large intestine without complications: Secondary | ICD-10-CM

## 2021-05-14 DIAGNOSIS — L308 Other specified dermatitis: Secondary | ICD-10-CM | POA: Diagnosis not present

## 2021-05-14 DIAGNOSIS — K219 Gastro-esophageal reflux disease without esophagitis: Secondary | ICD-10-CM

## 2021-05-14 MED ORDER — PANTOPRAZOLE SODIUM 40 MG PO TBEC: 40.0000 mg | DELAYED_RELEASE_TABLET | Freq: Two times a day (BID) | ORAL | 2 refills | Status: DC

## 2021-05-14 NOTE — Patient Instructions (Addendum)
Discontinue Entyvio.  Please purchase the following medications over the counter and take as directed: Multivitamin once daily  Continue your pantoprazole.  Please follow up with Dr Hilarie Fredrickson on Monday, 08/24/21 at 2:10 pm.  Please send a note via mychart to [place] should you experience a crohns flare before your next appointment.  If you are age 66 or older, your body mass index should be between 23-30. Your Body mass index is 29.07 kg/m. If this is out of the aforementioned range listed, please consider follow up with your Primary Care Provider. __________________________________________________________  The [place] [facility] providers would like to encourage you to use [facility] to communicate with providers for non-urgent requests or questions.  Due to long hold times on the telephone, sending your provider a message by [facility] may be a faster and more efficient way to get a response.  Please allow 48 business hours for a response.  Please remember that this is for non-urgent requests.   Due to recent changes in healthcare laws, you may see the results of your imaging and laboratory studies on MyChart before your provider has had a chance to review them.  We understand that in some cases there may be results that are confusing or concerning to you. Not all laboratory results come back in the same time frame and the provider may be waiting for multiple results in order to interpret others.  Please give [place] 48 hours in order for your provider to thoroughly review all the results before contacting the office for clarification of your results.

## 2021-05-14 NOTE — Progress Notes (Signed)
   Subjective:    Patient ID: Hunter Edwards, male    DOB: 04-01-1956, 65 y.o.   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 fib with history of ablation, hypertension and hyperlipidemia who is here for follow-up.  He was last seen in the office on 04/06/2021.  He developed injection reaction to Humira earlier this year and we started [facility] in May 2022.  His first infusion was on 03/26/2021 and then on May 13 he noticed an itchy rash on his arms legs chest and back.  He had had prior rash treated with steroids some years ago.  The decision was made to get a second dose of Entyvio.  Today he reports that he had recurrence of his itchy pruritic rash after the first dose of Entyvio.  This worsened after the second dose of Entyvio.  Previously rash was on her arms, trunk and back but during these episodes he has had rash on bilateral lower extremities and tops of his feet.  It even involve his palms.  He has had this rash dating back to 2015 intermittently and it was found to be a spongiotic psoriasiform dermatitis.  He does not want to take any more Entyvio.  He also noticed to mouth blisters 1 with each dose of Entyvio  No bowel symptoms.  Normal bowel habits.  No abdominal pain.  No blood in stool.  No diarrhea   Review of Systems As per HPI, otherwise negative  Current Medications, Allergies, Past Medical History, Past Surgical History, Family History and Social History were reviewed in [employer] record.    Objective:   Physical Exam BP 128/70   Pulse 91   Ht 5' 7"  (1.702 m)   Wt 185 lb 9.6 oz (84.2 kg)   SpO2 95%   BMI 29.07 kg/m  Gen: awake, alert, NAD HEENT: anictericAbd: soft, NT/ND, +BS throughout Ext: no c/c/e, now scaly red with crusted papules on extensor  surfaces of bilateral upper extremity also present on the right palmar surface Neuro: nonfocal      Assessment & Plan:  65 year old male with a history of Crohn's colitis with perianal involvement and prior fistula (diagnosis 2011), GERD, history of steroid responsive dermatitis, A. fib with history of ablation, hypertension and hyperlipidemia who is here for follow-up.   Crohn's colitis --he done very well with Humira but developed injection reactions after his COVID-19 booster shot.  Unclear what caused this cross-reactivity but we have stopped Humira and he has had 2 doses of Entyvio.  With starting Entyvio he has had recurrence of his dermatitis previously known to be psoriasiform type dermatitis.  Due to the worsening rash I agree with stopping Entyvio.  I do think he would do better with therapy but he prefers a watch and wait approach over the next several months.  Other options would be Cimzia and Stelara. --Discontinue Entyvio --Follow-up with me in October, though he is asked to notify me by MyChart message or calling the office to discuss with Vaughan Basta any recurrent abdominal pain, loose stools blood in stool or colitis type symptoms --Daily multivitamin recommended  2.  GERD --continue pantoprazole once to twice daily  20 minutes total spent today including patient facing time, coordination of care, reviewing medical history/procedures/pertinent radiology studies, and documentation of the encounter.

## 2021-05-15 ENCOUNTER — Telehealth: Payer: Self-pay | Admitting: *Deleted

## 2021-05-15 NOTE — Telephone Encounter (Signed)
Date: 05/15/2021 [facility] [place], [place] [address] RE: Edwena Bunde approved your request for coverage of Pantoprazole Sodium 40MG OR TBEC. Dear Adon Lonia SkinnerMarya Amsler pleased to let you know that we've approved your or your doctor's request for coverage (sometimes called a prior authorization) for Pantoprazole Sodium 40MG OR TBEC. You can now fill your prescription, and it will be covered according to your plan. As long as you remain covered by your prescription drug plan and there are no changes to your plan benefits, this request is approved from 05/15/2021 to 05/14/2024. When this approval expires, please speak to your doctor about your treatment. Sincerely, [facility] cc: Dr. Zenovia Jarred

## 2021-05-19 ENCOUNTER — Other Ambulatory Visit: Payer: Self-pay | Admitting: Internal Medicine

## 2021-05-19 ENCOUNTER — Ambulatory Visit: Payer: BC Managed Care – PPO

## 2021-06-17 NOTE — Progress Notes (Signed)
Subjective:    Patient ID: Hunter Edwards, male    DOB: [birth year], 65 y.o.   MRN: [id number]   This visit occurred during the SARS-CoV-2 public health emergency.  Safety protocols were in place, including screening questions prior to the visit, additional usage of staff PPE, and extensive cleaning of exam room while observing appropriate contact time as indicated for disinfecting solutions.   HPI He is here for a physical exam.   He has a rash  - his chronic rash and rash from entyvio.  Both rashes are very itchy.   He has rash on his legs, arms, back, butt and abdomen.  He has been applying over-the-counter cortisone cream with minimal improvement.  He has not been able to get this controlled.  Medications and allergies reviewed with patient and updated if appropriate.  Patient Active Problem List   Diagnosis Date Noted   Skin irritation 03/17/2021   Oral ulcer 03/17/2021   Crohn's disease of colon with complication ([place]) 49/67/5916   Hyperlipidemia 06/18/2020   ED (erectile dysfunction) 06/17/2020   Prediabetes 06/16/2020   Rash and nonspecific skin eruption 05/29/2018   Muscle cramp 04/14/2018   Essential hypertension 06/11/2015   Paroxysmal atrial fibrillation ([place]) 05/07/2015   GERD (gastroesophageal reflux disease) 05/07/2015   Crohn disease ([place]) 05/07/2015    Current Outpatient Medications on File Prior to Visit  Medication Sig Dispense Refill   aspirin 81 MG tablet Take 81 mg by mouth daily.     Bee Pollen 550 MG CAPS Take 2 capsules by mouth daily.     losartan (COZAAR) 25 MG tablet Take 1 tablet by mouth daily.     MAGNESIUM PO Take 1 tablet by mouth daily. Along with potassium     pantoprazole (PROTONIX) 40 MG tablet Take 1 tablet (40 mg total) by mouth 2 (two) times daily before a meal. 60 tablet 2   POTASSIUM PO Take 1 tablet by mouth daily. Along with magnesium     sildenafil (VIAGRA) 100 MG tablet Take 0.5-1 tablets (50-100 mg total) by mouth daily as  needed for erectile dysfunction. 5 tablet 11   Zinc Sulfate (ZINC 15 PO) Take by mouth.     No current facility-administered medications on file prior to visit.    Past Medical History:  Diagnosis Date   Anal fistula    Anorectal fistula    Atrial fibrillation ([place]) 2016   Benign hypertension    Cataract    Chest pain    Colitis    Contact dermatitis    Crohn's disease ([place]) 2011   Disease of jaw    Dysrhythmia 01/14/15   AFib   Fatigue    Folliculitis    GERD (gastroesophageal reflux disease)    History of Holter monitoring    Will be done on Friday, 01/17/15   Hyperlipidemia    IDA (iron deficiency anemia)    Need for Tdap vaccination    Neoplasm of prostate    Palpitations    Perirectal abscess    Rash    Rectal discharge     Past Surgical History:  Procedure Laterality Date   ANAL FISTULECTOMY N/A 01/24/2015   Procedure: FISTULECTOMY ANAL;  Surgeon: [name], MD;  Location: [facility];  Service: General;  Laterality: N/A;   RECTAL EXAM UNDER ANESTHESIA N/A 01/24/2015   Procedure: ANAL  EXAM UNDER ANESTHESIA;  Surgeon: [name], MD;  Location: [place];  Service: General;  Laterality: N/A;  TONSILLECTOMY  1962    Social History   Socioeconomic History   Marital status: Married    Spouse name: Not on file   Number of children: 1   Years of education: Not on file   Highest education level: Not on file  Occupational History   Occupation: Truck Geophysicist/field seismologist  Tobacco Use   Smoking status: Former    Packs/day: 1.00    Types: Cigarettes    Quit date: 11/15/1996    Years since quitting: 24.6   Smokeless tobacco: Never  Vaping Use   Vaping Use: Never used  Substance and Sexual Activity   Alcohol use: No    Alcohol/week: 0.0 standard drinks    Comment: Rarely   Drug use: No   Sexual activity: Not Currently  Other Topics Concern   Not on file  Social History Narrative   Not on file   Social Determinants of Health    Financial Resource Strain: Not on file  Food Insecurity: Not on file  Transportation Needs: Not on file  Physical Activity: Not on file  Stress: Not on file  Social Connections: Not on file    Family History  Problem Relation Age of Onset   Stomach cancer Father        died in his 15's   Diabetes Brother    Heart disease Brother    Colon cancer Neg Hx    Colon polyps Neg Hx    Kidney disease Neg Hx    Esophageal cancer Neg Hx    Gallbladder disease Neg Hx     Review of Systems  Constitutional:  Negative for chills and fever.  HENT:  Positive for postnasal drip and voice change. Negative for congestion and sinus pain.   Eyes:  Negative for visual disturbance.  Respiratory:  Negative for cough, shortness of breath and wheezing.   Cardiovascular:  Negative for chest pain, palpitations and leg swelling.  Gastrointestinal:  Negative for abdominal pain, blood in stool, constipation, diarrhea and nausea.       No gerd  Genitourinary:  Negative for difficulty urinating and dysuria.  Musculoskeletal:  Positive for arthralgias (b/l knees - has OA) and back pain (chronic - longer he sits the worse he gets).  Skin:  Positive for rash.  Neurological:  Negative for light-headedness and headaches.  Psychiatric/Behavioral:  Negative for dysphoric mood. The patient is not nervous/anxious.       Objective:   Vitals:   06/18/21 1119  BP: 130/80  Pulse: 83  Temp: 98.1 F (36.7 C)  SpO2: 97%   Filed Weights   06/18/21 1119  Weight: 183 lb (83 kg)   Body mass index is 28.66 kg/m.  BP Readings from Last 3 Encounters:  06/18/21 130/80  05/14/21 128/70  04/07/21 (!) 156/95    Wt Readings from Last 3 Encounters:  06/18/21 183 lb (83 kg)  05/14/21 185 lb 9.6 oz (84.2 kg)  04/07/21 188 lb (85.3 kg)     Physical Exam Constitutional: He appears well-developed and well-nourished. No distress.  HENT:  Head: Normocephalic and atraumatic.  Right Ear: External ear normal.  Left  Ear: External ear normal.  Mouth/Throat: Oropharynx is clear and moist.  Normal ear canals and TM b/l  Eyes: Conjunctivae and EOM are normal.  Neck: Neck supple. No tracheal deviation present. No thyromegaly present.  No carotid bruit  Cardiovascular: Normal rate, regular rhythm, normal heart sounds and intact distal pulses.   No murmur heard. Pulmonary/Chest: Effort normal and breath sounds  normal. No respiratory distress. He has no wheezes. He has no rales.  Abdominal: Soft. He exhibits no distension. There is no tenderness.  Genitourinary: deferred  Musculoskeletal: He exhibits no edema.  Lymphadenopathy:   He has no cervical adenopathy.  Skin: Skin is warm and dry. He is not diaphoretic.  Macular papular rash abdomen, back, arms and legs-many of the lesions are scabbed Psychiatric: He has a normal mood and affect. His behavior is normal.         Assessment & Plan:   Physical exam: Screening blood work  ordered Exercise   active Weight  overweight Substance abuse   none   Reviewed recommended immunizations.   Health Maintenance  Topic Date Due   COVID-19 Vaccine (2 - Pfizer series) 12/04/2020   INFLUENZA VACCINE  06/15/2021   Zoster Vaccines- Shingrix (1 of 2) 09/18/2021 (Originally 06/14/2005)   PNA vac Low Risk Adult (1 of 2 - PCV13) 06/18/2022 (Originally 06/14/2020)   COLONOSCOPY (Pts 45-65yr Insurance coverage will need to be confirmed)  04/04/2022   TETANUS/TDAP  06/17/2026   Hepatitis C Screening  Completed   HPV VACCINES  Aged Out     See Problem List for Assessment and Plan of chronic medical problems.

## 2021-06-17 NOTE — Patient Instructions (Addendum)
Blood work was ordered.     Medications changes include :   prednisone taper  Your prescription(s) have been submitted to your pharmacy. Please take as directed and contact our office if you believe you are having problem(s) with the medication(s).    Please followup in 1 year    Health Maintenance, Male Adopting a healthy lifestyle and getting preventive care are important in promoting health and wellness. Ask your health care provider about: The right schedule for you to have regular tests and exams. Things you can do on your own to prevent diseases and keep yourself healthy. What should I know about diet, weight, and exercise? Eat a healthy diet  Eat a diet that includes plenty of vegetables, fruits, low-fat dairy products, and lean protein. Do not eat a lot of foods that are high in solid fats, added sugars, or sodium.  Maintain a healthy weight Body mass index (BMI) is a measurement that can be used to identify possible weight problems. It estimates body fat based on height and weight. Your health care provider can help determine your BMI and help you achieve or maintain ahealthy weight. Get regular exercise Get regular exercise. This is one of the most important things you can do for your health. Most adults should: Exercise for at least 150 minutes each week. The exercise should increase your heart rate and make you sweat (moderate-intensity exercise). Do strengthening exercises at least twice a week. This is in addition to the moderate-intensity exercise. Spend less time sitting. Even light physical activity can be beneficial. Watch cholesterol and blood lipids Have your blood tested for lipids and cholesterol at 66 years of age, then havethis test every 5 years. You may need to have your cholesterol levels checked more often if: Your lipid or cholesterol levels are high. You are older than 66 years of age. You are at high risk for heart disease. What should I know about  cancer screening? Many types of cancers can be detected early and may often be prevented. Depending on your health history and family history, you may need to have cancer screening at various ages. This may include screening for: Colorectal cancer. Prostate cancer. Skin cancer. Lung cancer. What should I know about heart disease, diabetes, and high blood pressure? Blood pressure and heart disease High blood pressure causes heart disease and increases the risk of stroke. This is more likely to develop in people who have high blood pressure readings, are of African descent, or are overweight. Talk with your health care provider about your target blood pressure readings. Have your blood pressure checked: Every 3-5 years if you are 3-94 years of age. Every year if you are 82 years old or older. If you are between the ages of 31 and 32 and are a current or former smoker, ask your health care provider if you should have a one-time screening for abdominal aortic aneurysm (AAA). Diabetes Have regular diabetes screenings. This checks your fasting blood sugar level. Have the screening done: Once every three years after age 56 if you are at a normal weight and have a low risk for diabetes. More often and at a younger age if you are overweight or have a high risk for diabetes. What should I know about preventing infection? Hepatitis B If you have a higher risk for hepatitis B, you should be screened for this virus. Talk with your health care provider to find out if you are at risk forhepatitis B infection. Hepatitis C Blood testing  is recommended for: Everyone born from [birth year] through 1965. Anyone with known risk factors for hepatitis C. Sexually transmitted infections (STIs) You should be screened each year for STIs, including gonorrhea and chlamydia, if: You are sexually active and are younger than 66 years of age. You are older than 66 years of age and your health care provider tells you that you  are at risk for this type of infection. Your sexual activity has changed since you were last screened, and you are at increased risk for chlamydia or gonorrhea. Ask your health care provider if you are at risk. Ask your health care provider about whether you are at high risk for HIV. Your health care provider may recommend a prescription medicine to help prevent HIV infection. If you choose to take medicine to prevent HIV, you should first get tested for HIV. You should then be tested every 3 months for as long as you are taking the medicine. Follow these instructions at home: Lifestyle Do not use any products that contain nicotine or tobacco, such as cigarettes, e-cigarettes, and chewing tobacco. If you need help quitting, ask your health care provider. Do not use street drugs. Do not share needles. Ask your health care provider for help if you need support or information about quitting drugs. Alcohol use Do not drink alcohol if your health care provider tells you not to drink. If you drink alcohol: Limit how much you have to 0-2 drinks a day. Be aware of how much alcohol is in your drink. In the [place] one drink equals one 12 oz bottle of beer (355 mL), one 5 oz glass of wine (148 mL), or one 1 oz glass of hard liquor (44 mL). General instructions Schedule regular health, dental, and eye exams. Stay current with your vaccines. Tell your health care provider if: You often feel depressed. You have ever been abused or do not feel safe at home. Summary Adopting a healthy lifestyle and getting preventive care are important in promoting health and wellness. Follow your health care provider's instructions about healthy diet, exercising, and getting tested or screened for diseases. Follow your health care provider's instructions on monitoring your cholesterol and blood pressure. This information is not intended to replace advice given to you by your health care provider. Make sure you discuss any  questions you have with your healthcare provider. Document Revised: 10/25/2018 Document Reviewed: 10/25/2018 Elsevier Patient Education  2022 [employer].

## 2021-06-18 ENCOUNTER — Encounter: Payer: Self-pay | Admitting: Internal Medicine

## 2021-06-18 ENCOUNTER — Other Ambulatory Visit: Payer: Self-pay

## 2021-06-18 ENCOUNTER — Ambulatory Visit (INDEPENDENT_AMBULATORY_CARE_PROVIDER_SITE_OTHER): Payer: BC Managed Care – PPO | Admitting: Internal Medicine

## 2021-06-18 VITALS — BP 130/80 | HR 83 | Temp 98.1°F | Ht 67.0 in | Wt 183.0 lb

## 2021-06-18 DIAGNOSIS — N529 Male erectile dysfunction, unspecified: Secondary | ICD-10-CM

## 2021-06-18 DIAGNOSIS — E782 Mixed hyperlipidemia: Secondary | ICD-10-CM

## 2021-06-18 DIAGNOSIS — Z0000 Encounter for general adult medical examination without abnormal findings: Secondary | ICD-10-CM

## 2021-06-18 DIAGNOSIS — I10 Essential (primary) hypertension: Secondary | ICD-10-CM | POA: Diagnosis not present

## 2021-06-18 DIAGNOSIS — Z125 Encounter for screening for malignant neoplasm of prostate: Secondary | ICD-10-CM

## 2021-06-18 DIAGNOSIS — R21 Rash and other nonspecific skin eruption: Secondary | ICD-10-CM | POA: Diagnosis not present

## 2021-06-18 DIAGNOSIS — R7303 Prediabetes: Secondary | ICD-10-CM

## 2021-06-18 DIAGNOSIS — I480 Paroxysmal atrial fibrillation: Secondary | ICD-10-CM

## 2021-06-18 DIAGNOSIS — K219 Gastro-esophageal reflux disease without esophagitis: Secondary | ICD-10-CM

## 2021-06-18 DIAGNOSIS — K50119 Crohn's disease of large intestine with unspecified complications: Secondary | ICD-10-CM

## 2021-06-18 LAB — COMPREHENSIVE METABOLIC PANEL
ALT: 19 U/L (ref 0–53)
AST: 23 U/L (ref 0–37)
Albumin: 4 g/dL (ref 3.5–5.2)
Alkaline Phosphatase: 85 U/L (ref 39–117)
BUN: 13 mg/dL (ref 6–23)
CO2: 29 mEq/L (ref 19–32)
Calcium: 9.5 mg/dL (ref 8.4–10.5)
Chloride: 98 mEq/L (ref 96–112)
Creatinine, Ser: 1.23 mg/dL (ref 0.40–1.50)
GFR: 61.39 mL/min (ref >60.00)
Glucose, Bld: 79 mg/dL (ref 70–99)
Potassium: 3.9 mEq/L (ref 3.5–5.1)
Sodium: 134 mEq/L — ABNORMAL LOW (ref 135–145)
Total Bilirubin: 1.5 mg/dL — ABNORMAL HIGH (ref 0.2–1.2)
Total Protein: 7.5 g/dL (ref 6.0–8.3)

## 2021-06-18 LAB — LIPID PANEL
Cholesterol: 164 mg/dL (ref 0–200)
HDL: 57.4 mg/dL (ref >39.00)
LDL Cholesterol: 95 mg/dL (ref 0–99)
NonHDL: 106.27
Total CHOL/HDL Ratio: 3
Triglycerides: 56 mg/dL (ref 0.0–149.0)
VLDL: 11.2 mg/dL (ref 0.0–40.0)

## 2021-06-18 LAB — HEMOGLOBIN A1C: Hgb A1c MFr Bld: 5.9 % (ref 4.6–6.5)

## 2021-06-18 LAB — TSH: TSH: 1.32 u[IU]/mL (ref 0.35–5.50)

## 2021-06-18 MED ORDER — PREDNISONE 10 MG PO TABS: ORAL_TABLET | ORAL | 0 refills | Status: DC

## 2021-06-18 NOTE — Assessment & Plan Note (Signed)
Chronic Well-controlled Managed by cardiology Continue losartan 25 mg daily

## 2021-06-18 NOTE — Assessment & Plan Note (Signed)
Chronic Following with cardiology Check lipid panel  The 10-year ASCVD risk score Hunter Bussing DC Jr., et al., 2013) is: 14.8%   Values used to calculate the score:     Age: 65 years     Sex: Male     Is Non-Hispanic African American: No     Diabetic: No     Tobacco smoker: No     Systolic Blood Pressure: 852 mmHg     Is BP treated: Yes     HDL Cholesterol: 67 mg/dL     Total Cholesterol: 220 mg/dL

## 2021-06-18 NOTE — Assessment & Plan Note (Signed)
Chronic Has had ablation Following with cardiology On aspirin 81 mg daily

## 2021-06-18 NOTE — Assessment & Plan Note (Signed)
Chronic Currently not on any medication and asymptomatic Following with GI

## 2021-06-18 NOTE — Assessment & Plan Note (Signed)
Chronic Controlled Managed by GI Continue pantoprazole 40 mg twice daily

## 2021-06-18 NOTE — Assessment & Plan Note (Signed)
Chronic Check a1c Low sugar / carb diet Stressed regular exercise  

## 2021-06-18 NOTE — Assessment & Plan Note (Signed)
Acute on chronic He has a chronic rash that has not been evaluated extensively in the past without identifying a cause He also has an acute rash related to the [facility] that has not gone away Rash on legs, arms, back, but, abdomen Rash is very itchy Over-the-counter cortisone cream is not effective Prednisone has helped in the past-prednisone taper 40 mg x 3 days, decrease by 10 days every 3 days Deferred seeing dermatology He will let me know if this does not improve-I can prescribe a stronger steroid cream such as clobetasol to see if that helps

## 2021-06-18 NOTE — Assessment & Plan Note (Signed)
Chronic Continue sildenafil daily as needed

## 2021-06-19 LAB — PSA, TOTAL AND FREE
PSA, % Free: 38 % (calc) (ref >25)
PSA, Free: 0.3 ng/mL
PSA, Total: 0.8 ng/mL (ref <=4.0)

## 2021-07-10 ENCOUNTER — Encounter: Payer: Self-pay | Admitting: Internal Medicine

## 2021-07-14 ENCOUNTER — Ambulatory Visit: Payer: BC Managed Care – PPO

## 2021-07-28 ENCOUNTER — Encounter: Payer: Self-pay | Admitting: Internal Medicine

## 2021-07-29 MED ORDER — LEVOCETIRIZINE DIHYDROCHLORIDE 5 MG PO TABS: 5.0000 mg | ORAL_TABLET | Freq: Every evening | ORAL | 3 refills | Status: DC

## 2021-07-29 NOTE — Addendum Note (Signed)
Addended by: Binnie Rail on: 07/29/2021 05:11 PM   Modules accepted: Orders

## 2021-08-14 ENCOUNTER — Other Ambulatory Visit: Payer: Self-pay | Admitting: Internal Medicine

## 2021-08-24 ENCOUNTER — Ambulatory Visit (INDEPENDENT_AMBULATORY_CARE_PROVIDER_SITE_OTHER): Payer: BC Managed Care – PPO | Admitting: Internal Medicine

## 2021-08-24 ENCOUNTER — Encounter: Payer: Self-pay | Admitting: Internal Medicine

## 2021-08-24 VITALS — BP 130/88 | HR 83 | Ht 66.5 in | Wt 185.0 lb

## 2021-08-24 DIAGNOSIS — L308 Other specified dermatitis: Secondary | ICD-10-CM | POA: Diagnosis not present

## 2021-08-24 DIAGNOSIS — K5010 Crohn's disease of large intestine without complications: Secondary | ICD-10-CM

## 2021-08-24 DIAGNOSIS — K219 Gastro-esophageal reflux disease without esophagitis: Secondary | ICD-10-CM | POA: Diagnosis not present

## 2021-08-24 NOTE — Progress Notes (Signed)
   Subjective:    Patient ID: Hunter Edwards, male    DOB: 1956/08/12, 65 y.o.   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 fib with history of ablation, hypertension and hyperlipidemia who is here for follow-up.  Last seen on 05/14/2021.  Here alone today.  He had been switched to [facility] in May 2022 after developing an injection reaction to Humira.  With the first 2 doses of Entyvio he had dramatic flare of his spongiotic dermatitis.  We made the decision at last office visit to discontinue Entyvio.  He reports he has been doing and feeling very well.  His rash is almost gone and it is much less pruritic.  He has been taking Xyzal.  He is having no colitis symptoms, no abdominal pain, no diarrhea, no blood in stool.  Good appetite.  No nausea vomiting or hepatobiliary complaint.   Review of Systems As per HPI, otherwise negative  Current Medications, Allergies, Past Medical History, Past Surgical History, Family History and Social History were reviewed in [employer] record.    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 fib with history of ablation, hypertension and hyperlipidemia who is here for follow-up.   Crohn's colitis/history of perianal involvement --he did well with Humira but developed injection reaction after his COVID-19 booster.  Entyvio caused flare of his spongiotic dermatitis.  Currently he is asymptomatic  from Crohn's perspective.  He prefers observation over initiation of another biologic such as Cimzia or Stelara.  He understands that Crohn's traditionally is chronic and certainly may come back if we do not treat. --Observation for now --He will touch base with me by MyChart message in January 2023  2.  GERD --continue pantoprazole once to twice daily  20 minutes total spent today including patient facing time, coordination of care, reviewing medical history/procedures/pertinent radiology studies, and documentation of the encounter.

## 2021-08-24 NOTE — Patient Instructions (Signed)
Please send [place] a MyChart message in January to let us know how you are doing. We will decide on follow up at that time.  If you are age 66 or older, your body mass index should be between 23-30. Your Body mass index is 29.41 kg/m. If this is out of the aforementioned range listed, please consider follow up with your Primary Care Provider.  If you are age 27 or younger, your body mass index should be between 19-25. Your Body mass index is 29.41 kg/m. If this is out of the aformentioned range listed, please consider follow up with your Primary Care Provider.   ________________________________________________________  The [facility] providers would like to encourage you to use [facility] to communicate with providers for non-urgent requests or questions.  Due to long hold times on the telephone, sending your provider a message by [facility] may be a faster and more efficient way to get a response.  Please allow 48 business hours for a response.  Please remember that this is for non-urgent requests.   Due to recent changes in healthcare laws, you may see the results of your imaging and laboratory studies on MyChart before your provider has had a chance to review them.  We understand that in some cases there may be results that are confusing or concerning to you. Not all laboratory results come back in the same time frame and the provider may be waiting for multiple results in order to interpret others.  Please give [place] 48 hours in order for your provider to thoroughly review all the results before contacting the office for clarification of your results.

## 2021-08-31 ENCOUNTER — Encounter: Payer: Self-pay | Admitting: Internal Medicine

## 2022-01-13 ENCOUNTER — Other Ambulatory Visit: Payer: Self-pay | Admitting: Internal Medicine

## 2022-02-02 NOTE — Progress Notes (Signed)
? ? ?  Subjective:  ? ? Patient ID: Hunter Edwards, male    DOB: 1956-06-01, 66 y.o.   MRN: [id number] ? ?This visit occurred during the SARS-CoV-2 public health emergency.  Safety protocols were in place, including screening questions prior to the visit, additional usage of staff PPE, and extensive cleaning of exam room while observing appropriate contact time as indicated for disinfecting solutions. ? ? ? ?HPI ?[name] is here for  ?Chief Complaint  ?Patient presents with  ? Follow-up  ? ? ? ?The xyzal makes him too drowsy even if he takes it at night.  He has taken claritin and allegra in the past.  He gets a lot of PND.  Has a steroid nasal spray and it burned.   ? ?Has rash on b/l hands. - it itches, no pain.  ? ?Medications and allergies reviewed with patient and updated if appropriate. ? ?Current Outpatient Medications on File Prior to Visit  ?Medication Sig Dispense Refill  ? aspirin 81 MG tablet Take 81 mg by mouth daily.    ? Bee Pollen 550 MG CAPS Take 2 capsules by mouth daily.    ? losartan (COZAAR) 25 MG tablet Take 1 tablet by mouth daily.    ? MAGNESIUM PO Take 1 tablet by mouth daily. Along with potassium    ? pantoprazole (PROTONIX) 40 MG tablet TAKE 1 TABLET TWICE A DAY 180 tablet 1  ? POTASSIUM PO Take 1 tablet by mouth daily. Along with magnesium    ? sildenafil (VIAGRA) 100 MG tablet Take 0.5-1 tablets (50-100 mg total) by mouth daily as needed for erectile dysfunction. 5 tablet 11  ? Zinc Sulfate (ZINC 15 PO) Take by mouth.    ? ?No current facility-administered medications on file prior to visit.  ? ? ?Review of Systems  ?Constitutional:  Negative for fever.  ?HENT:  Positive for postnasal drip and sinus pressure (intermittent). Negative for congestion.   ?Respiratory:  Positive for cough (related to PND), shortness of breath and wheezing.   ? ?   ?Objective:  ? ?Vitals:  ? 02/03/22 1301  ?BP: 138/80  ?Pulse: 71  ?Temp: 98.2 ?F (36.8 ?C)  ?SpO2: 99%  ? ?BP Readings from Last 3 Encounters:   ?02/03/22 138/80  ?08/24/21 130/88  ?06/18/21 130/80  ? ?Wt Readings from Last 3 Encounters:  ?02/03/22 188 lb (85.3 kg)  ?08/24/21 185 lb (83.9 kg)  ?06/18/21 183 lb (83 kg)  ? ?Body mass index is 29.89 kg/m?. ? ?  ?Physical Exam ?Constitutional:   ?   General: He is not in acute distress. ?   Appearance: Normal appearance.  ?HENT:  ?   Head: Normocephalic.  ?Eyes:  ?   Conjunctiva/sclera: Conjunctivae normal.  ?Cardiovascular:  ?   Rate and Rhythm: Normal rate and regular rhythm.  ?   Heart sounds: No murmur heard. ?Pulmonary:  ?   Effort: Pulmonary effort is normal. No respiratory distress.  ?   Breath sounds: No wheezing or rales.  ?Musculoskeletal:  ?   Cervical back: No tenderness.  ?Skin: ?   General: Skin is warm and dry.  ?   Findings: Rash (b/l palm - erythematous with irregular borders with areas of dryness/blisters) present.  ?Neurological:  ?   Mental Status: He is alert.  ? ?   ? ? ? ? ? ?Assessment & Plan:  ? ? ?See Problem List for Assessment and Plan of chronic medical problems.  ? ? ? ? ?

## 2022-02-03 ENCOUNTER — Encounter: Payer: Self-pay | Admitting: Internal Medicine

## 2022-02-03 ENCOUNTER — Ambulatory Visit: Payer: BC Managed Care – PPO | Admitting: Internal Medicine

## 2022-02-03 ENCOUNTER — Other Ambulatory Visit: Payer: Self-pay

## 2022-02-03 VITALS — BP 138/80 | HR 71 | Temp 98.2°F | Ht 66.5 in | Wt 188.0 lb

## 2022-02-03 DIAGNOSIS — L309 Dermatitis, unspecified: Secondary | ICD-10-CM

## 2022-02-03 DIAGNOSIS — R0982 Postnasal drip: Secondary | ICD-10-CM

## 2022-02-03 DIAGNOSIS — Z23 Encounter for immunization: Secondary | ICD-10-CM

## 2022-02-03 MED ORDER — TRIAMCINOLONE ACETONIDE 0.5 % EX OINT: 1.0000 "application " | TOPICAL_OINTMENT | Freq: Two times a day (BID) | CUTANEOUS | 0 refills | Status: DC

## 2022-02-03 NOTE — Addendum Note (Signed)
Addended by: Marcina Millard on: 02/03/2022 04:02 PM ? ? Modules accepted: Orders ? ?

## 2022-02-03 NOTE — Assessment & Plan Note (Signed)
Chronic ?Taking xyzal - causing drowsiness - will stop ?Retry claritin  ?Did not tolerate steroid nasal spray ?Could try Astelin nasal spray ?

## 2022-02-03 NOTE — Patient Instructions (Addendum)
? ? ? ?  Medications changes include :   triamcinolone ointment - steroid, try claritin ? ? ?Your prescription(s) have been sent to your pharmacy.  ? ? ?

## 2022-02-03 NOTE — Assessment & Plan Note (Signed)
B/l hands ?cortisone not helping ?Trial of triamcinolone ointment ? ? ?

## 2022-05-10 ENCOUNTER — Encounter: Payer: Self-pay | Admitting: Internal Medicine

## 2022-06-29 ENCOUNTER — Other Ambulatory Visit: Payer: Self-pay | Admitting: Internal Medicine

## 2022-07-13 NOTE — Patient Instructions (Addendum)
Blood work was ordered.  Have a chest xray downstairs.    Medications changes include :   prednisone 40 mg daily with breakfast x 5 days   Your prescription(s) have been sent to your pharmacy.    A referral was ordered for pulmonary.     Someone from that office will call you to schedule an appointment.    Return in about 1 year (around 07/15/2023) for Physical Exam.    Health Maintenance, Male Adopting a healthy lifestyle and getting preventive care are important in promoting health and wellness. Ask your health care provider about: The right schedule for you to have regular tests and exams. Things you can do on your own to prevent diseases and keep yourself healthy. What should I know about diet, weight, and exercise? Eat a healthy diet  Eat a diet that includes plenty of vegetables, fruits, low-fat dairy products, and lean protein. Do not eat a lot of foods that are high in solid fats, added sugars, or sodium. Maintain a healthy weight Body mass index (BMI) is a measurement that can be used to identify possible weight problems. It estimates body fat based on height and weight. Your health care provider can help determine your BMI and help you achieve or maintain a healthy weight. Get regular exercise Get regular exercise. This is one of the most important things you can do for your health. Most adults should: Exercise for at least 150 minutes each week. The exercise should increase your heart rate and make you sweat (moderate-intensity exercise). Do strengthening exercises at least twice a week. This is in addition to the moderate-intensity exercise. Spend less time sitting. Even light physical activity can be beneficial. Watch cholesterol and blood lipids Have your blood tested for lipids and cholesterol at 67 years of age, then have this test every 5 years. You may need to have your cholesterol levels checked more often if: Your lipid or cholesterol levels are  high. You are older than 67 years of age. You are at high risk for heart disease. What should I know about cancer screening? Many types of cancers can be detected early and may often be prevented. Depending on your health history and family history, you may need to have cancer screening at various ages. This may include screening for: Colorectal cancer. Prostate cancer. Skin cancer. Lung cancer. What should I know about heart disease, diabetes, and high blood pressure? Blood pressure and heart disease High blood pressure causes heart disease and increases the risk of stroke. This is more likely to develop in people who have high blood pressure readings or are overweight. Talk with your health care provider about your target blood pressure readings. Have your blood pressure checked: Every 3-5 years if you are 45-67 years of age. Every year if you are 67 years of age or older. If you are between the ages of 40 and 5 and are a current or former smoker, ask your health care provider if you should have a one-time screening for abdominal aortic aneurysm (AAA). Diabetes Have regular diabetes screenings. This checks your fasting blood sugar level. Have the screening done: Once every three years after age 67 if you are at a normal weight and have a low risk for diabetes. More often and at a younger age if you are overweight or have a high risk for diabetes. What should I know about preventing infection? Hepatitis B If you have a higher risk for hepatitis B, you should be screened  for this virus. Talk with your health care provider to find out if you are at risk for hepatitis B infection. Hepatitis C Blood testing is recommended for: Everyone born from [birth year] through 1965. Anyone with known risk factors for hepatitis C. Sexually transmitted infections (STIs) You should be screened each year for STIs, including gonorrhea and chlamydia, if: You are sexually active and are younger than 67 years of  age. You are older than 67 years of age and your health care provider tells you that you are at risk for this type of infection. Your sexual activity has changed since you were last screened, and you are at increased risk for chlamydia or gonorrhea. Ask your health care provider if you are at risk. Ask your health care provider about whether you are at high risk for HIV. Your health care provider may recommend a prescription medicine to help prevent HIV infection. If you choose to take medicine to prevent HIV, you should first get tested for HIV. You should then be tested every 3 months for as long as you are taking the medicine. Follow these instructions at home: Alcohol use Do not drink alcohol if your health care provider tells you not to drink. If you drink alcohol: Limit how much you have to 0-2 drinks a day. Know how much alcohol is in your drink. In the [place] one drink equals one 12 oz bottle of beer (355 mL), one 5 oz glass of wine (148 mL), or one 1 oz glass of hard liquor (44 mL). Lifestyle Do not use any products that contain nicotine or tobacco. These products include cigarettes, chewing tobacco, and vaping devices, such as e-cigarettes. If you need help quitting, ask your health care provider. Do not use street drugs. Do not share needles. Ask your health care provider for help if you need support or information about quitting drugs. General instructions Schedule regular health, dental, and eye exams. Stay current with your vaccines. Tell your health care provider if: You often feel depressed. You have ever been abused or do not feel safe at home. Summary Adopting a healthy lifestyle and getting preventive care are important in promoting health and wellness. Follow your health care provider's instructions about healthy diet, exercising, and getting tested or screened for diseases. Follow your health care provider's instructions on monitoring your cholesterol and blood  pressure. This information is not intended to replace advice given to you by your health care provider. Make sure you discuss any questions you have with your health care provider. Document Revised: 03/23/2021 Document Reviewed: 03/23/2021 Elsevier Patient Education  [employer].

## 2022-07-13 NOTE — Progress Notes (Unsigned)
Subjective:    Patient ID: Hunter Edwards, male    DOB: 1956-10-12, 66 y.o.   MRN: [id number]     HPI Vibhav is here for a physical exam.   cough, wheeze x 3 weeks, SOB with "stale air" or exertion.  Uses albuterol twice a day.  He started using the albuterol recently-at his wife's.  He has never used an inhaler previously.  DOE x 1 year - not getting worse.     Medications and allergies reviewed with patient and updated if appropriate.  Current Outpatient Medications on File Prior to Visit  Medication Sig Dispense Refill   aspirin 81 MG tablet Take 81 mg by mouth daily.     Bee Pollen 550 MG CAPS Take 2 capsules by mouth daily.     losartan (COZAAR) 25 MG tablet Take 1 tablet by mouth daily.     Multiple Vitamins-Minerals (MULTIVITAMIN MEN PO) Take by mouth.     pantoprazole (PROTONIX) 40 MG tablet TAKE 1 TABLET TWICE A DAY 180 tablet 1   sildenafil (VIAGRA) 100 MG tablet Take 0.5-1 tablets (50-100 mg total) by mouth daily as needed for erectile dysfunction. 5 tablet 11   triamcinolone ointment (KENALOG) 0.5 % APPLY 1 APPLICATION. TOPICALLY 2 (TWO) TIMES DAILY. 30 g 0   No current facility-administered medications on file prior to visit.    Review of Systems  Constitutional:  Negative for fever.  HENT:  Positive for nosebleeds (occ) and postnasal drip. Negative for congestion, ear pain, sinus pain and sore throat.   Eyes:  Negative for visual disturbance.  Respiratory:  Positive for cough, shortness of breath (if he exerts himself) and wheezing.   Cardiovascular:  Positive for palpitations (occ). Negative for chest pain and leg swelling.  Gastrointestinal:  Negative for abdominal pain, blood in stool, constipation, diarrhea and nausea.  Genitourinary:  Negative for difficulty urinating, dysuria and hematuria.  Musculoskeletal:  Positive for arthralgias (knees) and back pain.  Skin:  Positive for rash (hands).  Neurological:  Positive for headaches (occ). Negative for  light-headedness.  Psychiatric/Behavioral:  Negative for dysphoric mood. The patient is not nervous/anxious.        Objective:   Vitals:   07/14/22 1331  BP: 130/84  Pulse: 71  Temp: 98.3 F (36.8 C)  SpO2: 95%   Filed Weights   07/14/22 1331  Weight: 182 lb (82.6 kg)   Body mass index is 28.94 kg/m.  BP Readings from Last 3 Encounters:  07/14/22 130/84  02/03/22 138/80  08/24/21 130/88    Wt Readings from Last 3 Encounters:  07/14/22 182 lb (82.6 kg)  02/03/22 188 lb (85.3 kg)  08/24/21 185 lb (83.9 kg)      Physical Exam Constitutional: He appears well-developed and well-nourished. No distress.  HENT:  Head: Normocephalic and atraumatic.  Right Ear: External ear normal.  Left Ear: External ear normal.  Mouth/Throat: Oropharynx is clear and moist.  Normal ear canals and TM b/l  Eyes: Conjunctivae and EOM are normal.  Neck: Neck supple. No tracheal deviation present. No thyromegaly present.  No carotid bruit  Cardiovascular: Normal rate, regular rhythm, normal heart sounds and intact distal pulses.   No murmur heard. Pulmonary/Chest: Effort normal and breath sounds normal. No respiratory distress. He has no wheezes. He has no rales.  Abdominal: Soft. He exhibits no distension. There is no tenderness.  Genitourinary: deferred  Musculoskeletal: He exhibits no edema.  Lymphadenopathy:   He has no cervical adenopathy.  Skin: Skin  is warm and dry. He is not diaphoretic. Rash on b/l hands - dry, mildly erythematous areas - looks like eczema Psychiatric: He has a normal mood and affect. His behavior is normal.         Assessment & Plan:   Physical exam: Screening blood work  ordered Exercise   none, active-encouraged regular exercise Weight  overweight Substance abuse   none   Reviewed recommended immunizations.   Health Maintenance  Topic Date Due   COLONOSCOPY (Pts 45-62yr Insurance coverage will need to be confirmed)  04/04/2022   COVID-19  Vaccine (2 - Pfizer series) 07/30/2022 (Originally 01/08/2021)   Zoster Vaccines- Shingrix (1 of 2) 10/14/2022 (Originally 06/14/2005)   INFLUENZA VACCINE  02/13/2023 (Originally 06/15/2022)   TETANUS/TDAP  06/17/2026   Pneumonia Vaccine 66 Years old  Completed   Hepatitis C Screening  Completed   HPV VACCINES  Aged Out   Encouraged him to schedule his colonoscopy.  See Problem List for Assessment and Plan of chronic medical problems.

## 2022-07-14 ENCOUNTER — Ambulatory Visit (INDEPENDENT_AMBULATORY_CARE_PROVIDER_SITE_OTHER): Payer: BC Managed Care – PPO | Admitting: Internal Medicine

## 2022-07-14 ENCOUNTER — Ambulatory Visit (INDEPENDENT_AMBULATORY_CARE_PROVIDER_SITE_OTHER): Payer: BC Managed Care – PPO

## 2022-07-14 ENCOUNTER — Encounter: Payer: Self-pay | Admitting: Internal Medicine

## 2022-07-14 VITALS — BP 130/84 | HR 71 | Temp 98.3°F | Ht 66.5 in | Wt 182.0 lb

## 2022-07-14 DIAGNOSIS — Z125 Encounter for screening for malignant neoplasm of prostate: Secondary | ICD-10-CM | POA: Diagnosis not present

## 2022-07-14 DIAGNOSIS — I480 Paroxysmal atrial fibrillation: Secondary | ICD-10-CM | POA: Diagnosis not present

## 2022-07-14 DIAGNOSIS — R0609 Other forms of dyspnea: Secondary | ICD-10-CM | POA: Diagnosis not present

## 2022-07-14 DIAGNOSIS — R062 Wheezing: Secondary | ICD-10-CM

## 2022-07-14 DIAGNOSIS — I10 Essential (primary) hypertension: Secondary | ICD-10-CM | POA: Diagnosis not present

## 2022-07-14 DIAGNOSIS — K50119 Crohn's disease of large intestine with unspecified complications: Secondary | ICD-10-CM

## 2022-07-14 DIAGNOSIS — R7303 Prediabetes: Secondary | ICD-10-CM | POA: Diagnosis not present

## 2022-07-14 DIAGNOSIS — Z0000 Encounter for general adult medical examination without abnormal findings: Secondary | ICD-10-CM | POA: Diagnosis not present

## 2022-07-14 DIAGNOSIS — E782 Mixed hyperlipidemia: Secondary | ICD-10-CM | POA: Diagnosis not present

## 2022-07-14 DIAGNOSIS — K219 Gastro-esophageal reflux disease without esophagitis: Secondary | ICD-10-CM | POA: Diagnosis not present

## 2022-07-14 LAB — COMPREHENSIVE METABOLIC PANEL
ALT: 18 U/L (ref 0–53)
AST: 20 U/L (ref 0–37)
Albumin: 4.2 g/dL (ref 3.5–5.2)
Alkaline Phosphatase: 82 U/L (ref 39–117)
BUN: 17 mg/dL (ref 6–23)
CO2: 28 mEq/L (ref 19–32)
Calcium: 9.7 mg/dL (ref 8.4–10.5)
Chloride: 100 mEq/L (ref 96–112)
Creatinine, Ser: 1.22 mg/dL (ref 0.40–1.50)
GFR: 61.53 mL/min (ref >60.00)
Glucose, Bld: 91 mg/dL (ref 70–99)
Potassium: 4.4 mEq/L (ref 3.5–5.1)
Sodium: 137 mEq/L (ref 135–145)
Total Bilirubin: 1.2 mg/dL (ref 0.2–1.2)
Total Protein: 7.4 g/dL (ref 6.0–8.3)

## 2022-07-14 LAB — CBC WITH DIFFERENTIAL/PLATELET
Basophils Absolute: 0.1 10*3/uL (ref 0.0–0.1)
Basophils Relative: 0.6 % (ref 0.0–3.0)
Eosinophils Absolute: 0.3 10*3/uL (ref 0.0–0.7)
Eosinophils Relative: 2.9 % (ref 0.0–5.0)
HCT: 42.8 % (ref 39.0–52.0)
Hemoglobin: 14.7 g/dL (ref 13.0–17.0)
Lymphocytes Relative: 20.6 % (ref 12.0–46.0)
Lymphs Abs: 2.5 10*3/uL (ref 0.7–4.0)
MCHC: 34.3 g/dL (ref 30.0–36.0)
MCV: 88.5 fl (ref 78.0–100.0)
Monocytes Absolute: 1 10*3/uL (ref 0.1–1.0)
Monocytes Relative: 8.1 % (ref 3.0–12.0)
Neutro Abs: 8.1 10*3/uL — ABNORMAL HIGH (ref 1.4–7.7)
Neutrophils Relative %: 67.8 % (ref 43.0–77.0)
Platelets: 261 10*3/uL (ref 150.0–400.0)
RBC: 4.84 Mil/uL (ref 4.22–5.81)
RDW: 13.1 % (ref 11.5–15.5)
WBC: 11.9 10*3/uL — ABNORMAL HIGH (ref 4.0–10.5)

## 2022-07-14 LAB — LIPID PANEL
Cholesterol: 208 mg/dL — ABNORMAL HIGH (ref 0–200)
HDL: 58.2 mg/dL (ref >39.00)
LDL Cholesterol: 125 mg/dL — ABNORMAL HIGH (ref 0–99)
NonHDL: 149.34
Total CHOL/HDL Ratio: 4
Triglycerides: 121 mg/dL (ref 0.0–149.0)
VLDL: 24.2 mg/dL (ref 0.0–40.0)

## 2022-07-14 LAB — PSA: PSA: 0.98 ng/mL (ref 0.10–4.00)

## 2022-07-14 LAB — HEMOGLOBIN A1C: Hgb A1c MFr Bld: 5.9 % (ref 4.6–6.5)

## 2022-07-14 MED ORDER — PREDNISONE 20 MG PO TABS: 40.0000 mg | ORAL_TABLET | Freq: Every day | ORAL | 0 refills | Status: AC

## 2022-07-14 NOTE — Assessment & Plan Note (Signed)
New For the past couple of weeks has had coughing, wheezing.  Has also had some dyspnea on exertion over the past year or so Has been using albuterol a couple of times a day which helps Chest x-ray today ?  COPD versus reactive airway disease due to allergies asthma We will refer to pulmonary for further evaluation

## 2022-07-14 NOTE — Assessment & Plan Note (Addendum)
H/o afib s/p ablation Follows with cardio Does have an occasional feeling of A-fib-cardiology wants to do another Holter and most likely that would be the plan at his upcoming appointment On ASA 81 mg daily

## 2022-07-14 NOTE — Assessment & Plan Note (Signed)
Chronic Check lipid panel  Continue lifestyle controlled Regular exercise and healthy diet encouraged

## 2022-07-14 NOTE — Assessment & Plan Note (Addendum)
Chronic Following with GI Due for colonoscopy - he will schedule Controlled Not on any medication

## 2022-07-14 NOTE — Assessment & Plan Note (Signed)
Chronic Check a1c Low sugar / carb diet Stressed regular exercise  

## 2022-07-14 NOTE — Assessment & Plan Note (Signed)
New For the past year he has been experiencing some dyspnea on exertion.  For the past couple weeks he has had some coughing and wheezing and has been using his wife's albuterol inhaler a couple of times a day which does help Advised to discuss his dyspnea with his cardiologist ?  COPD, asthma, reactive airway disease given his probable allergies Chest x-ray today Basic labs today Referral to pulmonary For now continue albuterol as needed

## 2022-07-14 NOTE — Assessment & Plan Note (Signed)
Chronic BP well controlled Continue losartan 25 mg daily Cmp, cbc

## 2022-07-14 NOTE — Assessment & Plan Note (Signed)
Chronic GERD controlled Continue protonix 40 mg daily

## 2022-07-15 MED ORDER — AMOXICILLIN-POT CLAVULANATE 875-125 MG PO TABS
1.0000 | ORAL_TABLET | Freq: Two times a day (BID) | ORAL | 0 refills | Status: DC
Start: 2022-07-15 — End: 2023-03-14

## 2023-01-23 ENCOUNTER — Other Ambulatory Visit: Payer: Self-pay | Admitting: Internal Medicine

## 2023-02-01 ENCOUNTER — Telehealth: Payer: Self-pay | Admitting: Internal Medicine

## 2023-02-01 MED ORDER — PANTOPRAZOLE SODIUM 40 MG PO TBEC: 40.0000 mg | DELAYED_RELEASE_TABLET | Freq: Two times a day (BID) | ORAL | 1 refills | Status: DC

## 2023-02-01 NOTE — Telephone Encounter (Signed)
Patient last seen in 2022.  Will send pantoprazole to get patient to appointment but he must keep appointment for any further refills.

## 2023-02-01 NOTE — Telephone Encounter (Signed)
PT is calling to get pantoprazole refilled. He has scheduled a follow up appointment but it isnt until April 29th and he will run out before then. Please advise

## 2023-03-11 NOTE — Progress Notes (Unsigned)
03/14/2023 Hunter Edwards [id number] 1956-05-18  Referring provider: Pincus Sanes, MD Primary GI doctor: Dr. Rhea Belton  ASSESSMENT AND PLAN:   Crohn's disease of colon without complication (HCC) -04/04/2020 last colonoscopy on Humira 5 mm polyp, improved to colitis in remission left colon though still active inflammation lower rectum just above anal canal.  Recall 03/2022 but patient missed. -Patient is been on Remicade, Humira, most recently Entyvio however had subsequent skin reactions, currently is not on any biologic therapy and symptom wise appears to be doing well. -Will get CRP, sed rate, fecal calprotectin as well as colonoscopy to evaluate for objective evidence of remission. -Will get hepatitis B surface antigen and TB Gold since patient is due in case we do need to initiate biologic therapy. -Vaccinations discussed with the patient though he is slightly concerned about repeating COVID vaccinations or any vaccination for that matter with recent reactions.  Discussed with PCP, can consider referring to allergist.  Pending the colonoscopy can discuss potentially Stelara/Skyrizi We have discussed the risks of bleeding, infection, perforation, medication reactions, and remote risk of death associated with colonoscopy. All questions were answered and the patient acknowledges these risk and wishes to proceed.   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  Gastroesophageal reflux disease without esophagitis Controlled at this time with medications. Continue to avoid NSAIDs, alcohol. Continue once a day pantoprazole.  Patient Care Team: Pincus Sanes, MD as PCP - General (Internal Medicine)  HISTORY OF PRESENT ILLNESS: 67 y.o. male presents for evaluation of Crohn's colitis with perianal involvement and prior fistula diagnosed 2011. Last seen in the office on 08/24/2021.   IBD history  2011 diagnosed after colonoscopy. Lialda however developed  abscess/fistula 2016 MRI pelvis with and without contrast showed perirectal abscess posterior distal rectum status post fistulotomy and antibiotics 2016 Remicade initiated but thinks he had to stop for insurance Was on humira however developed injection reaction after COVID-19 booster 04/03/2021 switched to [facility] however had dramatic flare of his spongiotic dermatitis  05/14/2021 Entyvio stopped No reinitiation of Biologics, potentially Cimizia or Stelara  Last colonoscopy: 04/04/2020 good preparation, patient was on Humira 40 mg every 2 weeks.  Colonoscopy showed perianal skin tags, TI normal, 5 mm polyp transverse colon, much improved Crohn's colitis remission left colon, ulceration tissue edema and distal rectum, diverticulosis, internal hemorrhoids. Pathology showed Crohn's disease in remission throughout the colon, active inflammation lower rectum just above the anal canal.   Adenomatous polyp recall 03/2022 Last small bowel imaging: 2016 MRI pelvis perirectal abscess treated antibiotics Extraintestinal manifestations: none Surgical history: 2016 status post fistulotomy with Dr. Maisie Fus Other significant medical history: steroid responsive dermatitis (spongiotic psoriasiform dermatitis)  Atrial fibrillation status post ablation, on low dose ASA GERD on Protonix once daily Arthralgias, knees, hips, lower back- better with lying down, diagnosed OA  Current History The patient has 1 bowel movements per day which are  well formed, occ constipation if not having enough water .   The patient currently denies significant abdominal pain or discomfort.   The patient denies fever.   The patient has has purposeful weight loss.   Has some mild SOB with exertion, no CP, no cough, wheezing.   Recent labs: No recent sed rate or CRP last time was 2020 and was normal range patient was on Humira at that time.  No recent fecal calprotectin. 07/14/2022 WBC 11.9 HGB 14.7 MCV 88.5 Platelets 261.0 03/16/2021  Iron 58 Ferritin 25.8  07/14/2022 AST 20 ALT  18 Alkphos 82 TBili 1.2  TB GOLD 03/16/2021 NEGATIVE or TB skin if indeterminate.  HepBsAG 03/16/2021 NON-REACTIVE   IBD Health Care Maintenance: Annual Flu Vaccine - gets yearly Pneumococcal Vaccine if receiving immunosuppression: -  had 2023 TB testing if on anti-TNF, yearly - getting this visit Vitamin D screening - get next OV COVID- vaccine done Shingrix suggest getting  Immunization History  Administered Date(s) Administered   Hepatitis B, PED/ADOLESCENT 01/22/2015, 02/24/2015, 07/30/2015   Hepb-cpg 06/20/2019, 07/24/2019   Influenza Inj Mdck Quad Pf 11/15/2017, 11/14/2019, 12/29/2020   Influenza,inj,Quad PF,6+ Mos 07/30/2015, 08/28/2016, 11/17/2018   Influenza-Unspecified 08/28/2016   PFIZER(Purple Top)SARS-COV-2 Vaccination 11/13/2020   PNEUMOCOCCAL CONJUGATE-20 02/03/2022   Tdap 06/17/2016   He denies blood thinner use.  He denies NSAID use.  He denies ETOH use.   He denies tobacco use.  He denies drug use.    He  reports that he quit smoking about 26 years ago. His smoking use included cigarettes. He smoked an average of 1 pack per day. He has never used smokeless tobacco. He reports that he does not drink alcohol and does not use drugs.  RELEVANT LABS AND IMAGING: CBC    Component Value Date/Time   WBC 11.9 (H) 07/14/2022 1427   RBC 4.84 07/14/2022 1427   HGB 14.7 07/14/2022 1427   HCT 42.8 07/14/2022 1427   PLT 261.0 07/14/2022 1427   MCV 88.5 07/14/2022 1427   MCH 30.6 02/19/2021 1218   MCHC 34.3 07/14/2022 1427   RDW 13.1 07/14/2022 1427   LYMPHSABS 2.5 07/14/2022 1427   MONOABS 1.0 07/14/2022 1427   EOSABS 0.3 07/14/2022 1427   BASOSABS 0.1 07/14/2022 1427   Recent Labs    07/14/22 1427  HGB 14.7    CMP     Component Value Date/Time   NA 137 07/14/2022 1427   NA 141 11/04/2016 0000   K 4.4 07/14/2022 1427   CL 100 07/14/2022 1427   CO2 28 07/14/2022 1427   GLUCOSE 91 07/14/2022 1427   BUN 17  07/14/2022 1427   BUN 10 11/04/2016 0000   CREATININE 1.22 07/14/2022 1427   CREATININE 1.22 06/17/2020 1413   CALCIUM 9.7 07/14/2022 1427   PROT 7.4 07/14/2022 1427   ALBUMIN 4.2 07/14/2022 1427   AST 20 07/14/2022 1427   ALT 18 07/14/2022 1427   ALKPHOS 82 07/14/2022 1427   BILITOT 1.2 07/14/2022 1427   GFRNONAA 58 (L) 02/19/2021 1218   GFRAA >60 11/25/2019 1408      Latest Ref Rng & Units 07/14/2022    2:27 PM 06/18/2021   12:27 PM 02/19/2021   12:18 PM  Hepatic Function  Total Protein 6.0 - 8.3 g/dL 7.4  7.5  7.8   Albumin 3.5 - 5.2 g/dL 4.2  4.0  4.2   AST 0 - 37 U/L 20  23  27    ALT 0 - 53 U/L 18  19  24    Alk Phosphatase 39 - 117 U/L 82  85  72   Total Bilirubin 0.2 - 1.2 mg/dL 1.2  1.5  1.3       Current Medications:    Current Outpatient Medications (Cardiovascular):    amLODipine (NORVASC) 2.5 MG tablet, Take 2.5 mg by mouth daily.   losartan (COZAAR) 25 MG tablet, Take 1 tablet by mouth daily.   sildenafil (VIAGRA) 100 MG tablet, Take 0.5-1 tablets (50-100 mg total) by mouth daily as needed for erectile dysfunction.   Current Outpatient Medications (Analgesics):  aspirin 81 MG tablet, Take 81 mg by mouth daily.   Current Outpatient Medications (Other):    Bee Pollen 550 MG CAPS, Take 2 capsules by mouth daily.   Multiple Vitamins-Minerals (MULTIVITAMIN MEN PO), Take by mouth.   pantoprazole (PROTONIX) 40 MG tablet, Take 1 tablet (40 mg total) by mouth 2 (two) times daily. Must keep April appointment for any further refills   triamcinolone ointment (KENALOG) 0.5 %, APPLY 1 APPLICATION. TOPICALLY 2 (TWO) TIMES DAILY.  Medical History:  Past Medical History:  Diagnosis Date   Anal fistula    Anorectal fistula    Atrial fibrillation (HCC) 2016   Benign hypertension    Cataract    Chest pain    Colitis    Contact dermatitis    Crohn's disease (HCC) 2011   Disease of jaw    Dysrhythmia 01/14/15   AFib   Fatigue    Folliculitis    GERD  (gastroesophageal reflux disease)    History of Holter monitoring    Will be done on Friday, 01/17/15   Hyperlipidemia    IDA (iron deficiency anemia)    Need for Tdap vaccination    Neoplasm of prostate    Palpitations    Perirectal abscess    Rash    Rectal discharge    Allergies:  Allergies  Allergen Reactions   Statins Other (See Comments)    Muscle cramp/weakness/aches    Sulfa Antibiotics Other (See Comments)    Unspecified   Sulfamethoxazole Other (See Comments)    Unsure of reaction   Entyvio [Vedolizumab] Rash     Surgical History:  He  has a past surgical history that includes Tonsillectomy (1962); Rectal exam under anesthesia (N/A, 01/24/2015); and Anal fistulectomy (N/A, 01/24/2015). Family History:  His family history includes Diabetes in his brother; Heart disease in his brother; Stomach cancer in his father.  REVIEW OF SYSTEMS  : All other systems reviewed and negative except where noted in the History of Present Illness.  PHYSICAL EXAM: BP (!) 142/84   Pulse 80   Ht 5' 6.5" (1.689 m)   Wt 184 lb (83.5 kg)   SpO2 97%   BMI 29.25 kg/m  General Appearance: Well nourished, in no apparent distress. Head:   Normocephalic and atraumatic. Eyes:  sclerae anicteric,conjunctive pink  Respiratory: Respiratory effort normal, BS equal bilaterally without rales, rhonchi, wheezing. Cardio: RRR with no MRGs. Peripheral pulses intact.  Abdomen: Soft,  Obese ,active bowel sounds. No tenderness . Without guarding and Without rebound. No masses. Rectal: Not evaluated Musculoskeletal: Full ROM, Normal gait. Without edema. Skin:  Dry and intact without significant lesions or rashes Neuro: Alert and  oriented x4;  No focal deficits. Psych:  Cooperative. Normal mood and affect.    Doree Albee, PA-C 10:31 AM

## 2023-03-14 ENCOUNTER — Encounter: Payer: Self-pay | Admitting: Physician Assistant

## 2023-03-14 ENCOUNTER — Other Ambulatory Visit (INDEPENDENT_AMBULATORY_CARE_PROVIDER_SITE_OTHER): Payer: BC Managed Care – PPO

## 2023-03-14 ENCOUNTER — Ambulatory Visit: Payer: BC Managed Care – PPO | Admitting: Physician Assistant

## 2023-03-14 VITALS — BP 142/84 | HR 80 | Ht 66.5 in | Wt 184.0 lb

## 2023-03-14 DIAGNOSIS — Z1159 Encounter for screening for other viral diseases: Secondary | ICD-10-CM

## 2023-03-14 DIAGNOSIS — K5010 Crohn's disease of large intestine without complications: Secondary | ICD-10-CM | POA: Diagnosis not present

## 2023-03-14 DIAGNOSIS — K219 Gastro-esophageal reflux disease without esophagitis: Secondary | ICD-10-CM | POA: Diagnosis not present

## 2023-03-14 DIAGNOSIS — L308 Other specified dermatitis: Secondary | ICD-10-CM

## 2023-03-14 LAB — SEDIMENTATION RATE: Sed Rate: 16 mm/hr (ref 0–20)

## 2023-03-14 LAB — CBC WITH DIFFERENTIAL/PLATELET
Basophils Absolute: 0.1 10*3/uL (ref 0.0–0.1)
Basophils Relative: 0.7 % (ref 0.0–3.0)
Eosinophils Absolute: 0.3 10*3/uL (ref 0.0–0.7)
Eosinophils Relative: 2.9 % (ref 0.0–5.0)
HCT: 39.7 % (ref 39.0–52.0)
Hemoglobin: 14.1 g/dL (ref 13.0–17.0)
Lymphocytes Relative: 21.8 % (ref 12.0–46.0)
Lymphs Abs: 2 10*3/uL (ref 0.7–4.0)
MCHC: 35.5 g/dL (ref 30.0–36.0)
MCV: 88.5 fl (ref 78.0–100.0)
Monocytes Absolute: 0.8 10*3/uL (ref 0.1–1.0)
Monocytes Relative: 8.8 % (ref 3.0–12.0)
Neutro Abs: 6.2 10*3/uL (ref 1.4–7.7)
Neutrophils Relative %: 65.8 % (ref 43.0–77.0)
Platelets: 250 10*3/uL (ref 150.0–400.0)
RBC: 4.48 Mil/uL (ref 4.22–5.81)
RDW: 12.9 % (ref 11.5–15.5)
WBC: 9.3 10*3/uL (ref 4.0–10.5)

## 2023-03-14 LAB — BASIC METABOLIC PANEL
BUN: 18 mg/dL (ref 6–23)
CO2: 29 mEq/L (ref 19–32)
Calcium: 9.5 mg/dL (ref 8.4–10.5)
Chloride: 102 mEq/L (ref 96–112)
Creatinine, Ser: 1.17 mg/dL (ref 0.40–1.50)
GFR: 64.4 mL/min (ref >60.00)
Glucose, Bld: 92 mg/dL (ref 70–99)
Potassium: 4.2 mEq/L (ref 3.5–5.1)
Sodium: 138 mEq/L (ref 135–145)

## 2023-03-14 LAB — HEPATIC FUNCTION PANEL
ALT: 17 U/L (ref 0–53)
AST: 23 U/L (ref 0–37)
Albumin: 4.1 g/dL (ref 3.5–5.2)
Alkaline Phosphatase: 67 U/L (ref 39–117)
Bilirubin, Direct: 0.2 mg/dL (ref 0.0–0.3)
Total Bilirubin: 1.3 mg/dL — ABNORMAL HIGH (ref 0.2–1.2)
Total Protein: 7 g/dL (ref 6.0–8.3)

## 2023-03-14 LAB — HIGH SENSITIVITY CRP: CRP, High Sensitivity: 1.82 mg/L (ref 0.000–5.000)

## 2023-03-14 MED ORDER — NA SULFATE-K SULFATE-MG SULF 17.5-3.13-1.6 GM/177ML PO SOLN: 1.0000 | Freq: Once | ORAL | 0 refills | Status: AC

## 2023-03-14 NOTE — Progress Notes (Signed)
Addendum: Reviewed and agree with assessment and management plan. Kamrin Sibley M, MD  

## 2023-03-14 NOTE — Patient Instructions (Addendum)
Your provider has requested that you go to the basement level for lab work before leaving today. Press "B" on the elevator. The lab is located at the first door on the left as you exit the elevator.   You have been scheduled for a colonoscopy. Please follow written instructions given to you at your visit today.  Please pick up your prep supplies at the pharmacy within the next 1-3 days. If you use inhalers (even only as needed), please bring them with you on the day of your procedure.  _______________________________________________________  If your blood pressure at your visit was 140/90 or greater, please contact your primary care physician to follow up on this.  _______________________________________________________  If you are age 68 or older, your body mass index should be between 23-30. Your Body mass index is 29.25 kg/m. If this is out of the aforementioned range listed, please consider follow up with your Primary Care Provider.  If you are age 63 or younger, your body mass index should be between 19-25. Your Body mass index is 29.25 kg/m. If this is out of the aformentioned range listed, please consider follow up with your Primary Care Provider.   ________________________________________________________  The [facility] providers would like to encourage you to use [facility] to communicate with providers for non-urgent requests or questions.  Due to long hold times on the telephone, sending your provider a message by [facility] may be a faster and more efficient way to get a response.  Please allow 48 business hours for a response.  Please remember that this is for non-urgent requests.  _______________________________________________________ It was a pleasure to see you today!  Thank you for trusting me with your gastrointestinal care!

## 2023-03-15 LAB — HEPATITIS B SURFACE ANTIGEN: Hepatitis B Surface Ag: NONREACTIVE

## 2023-03-16 ENCOUNTER — Other Ambulatory Visit: Payer: BC Managed Care – PPO

## 2023-03-16 DIAGNOSIS — Z1159 Encounter for screening for other viral diseases: Secondary | ICD-10-CM

## 2023-03-16 DIAGNOSIS — K5010 Crohn's disease of large intestine without complications: Secondary | ICD-10-CM

## 2023-03-16 LAB — QUANTIFERON-TB GOLD PLUS
Mitogen-NIL: 7.5 IU/mL
NIL: 0.02 IU/mL
QuantiFERON-TB Gold Plus: NEGATIVE
TB1-NIL: 0 IU/mL
TB2-NIL: 0 IU/mL

## 2023-03-21 LAB — CALPROTECTIN: Calprotectin: 226 mcg/g — ABNORMAL HIGH

## 2023-05-02 ENCOUNTER — Other Ambulatory Visit: Payer: Self-pay | Admitting: Internal Medicine

## 2023-05-11 ENCOUNTER — Telehealth: Payer: Self-pay | Admitting: Physician Assistant

## 2023-05-11 ENCOUNTER — Other Ambulatory Visit: Payer: Self-pay

## 2023-05-11 ENCOUNTER — Encounter: Payer: Self-pay | Admitting: Internal Medicine

## 2023-05-11 DIAGNOSIS — R197 Diarrhea, unspecified: Secondary | ICD-10-CM

## 2023-05-11 DIAGNOSIS — K5010 Crohn's disease of large intestine without complications: Secondary | ICD-10-CM

## 2023-05-11 NOTE — Telephone Encounter (Signed)
See my chart message

## 2023-05-11 NOTE — Telephone Encounter (Signed)
Inbound call from patient calling in regards to MyChart message. Please advise.

## 2023-05-11 NOTE — Telephone Encounter (Signed)
Fecal cal C diff pcr Stool culture  Possible IBD flare, rule out infection and then can make treatment decisions

## 2023-05-12 ENCOUNTER — Other Ambulatory Visit: Payer: BC Managed Care – PPO

## 2023-05-13 ENCOUNTER — Other Ambulatory Visit: Payer: BC Managed Care – PPO

## 2023-05-13 DIAGNOSIS — K5010 Crohn's disease of large intestine without complications: Secondary | ICD-10-CM

## 2023-05-13 DIAGNOSIS — R197 Diarrhea, unspecified: Secondary | ICD-10-CM

## 2023-05-14 LAB — CLOSTRIDIUM DIFFICILE BY PCR: Toxigenic C. Difficile by PCR: NEGATIVE

## 2023-05-15 LAB — STOOL CULTURE: E coli, Shiga toxin Assay: NEGATIVE

## 2023-05-16 ENCOUNTER — Other Ambulatory Visit: Payer: Self-pay

## 2023-05-16 MED ORDER — BUDESONIDE ER 9 MG PO TB24: 9.0000 mg | ORAL_TABLET | Freq: Every day | ORAL | 0 refills | Status: DC

## 2023-05-17 ENCOUNTER — Other Ambulatory Visit: Payer: Self-pay

## 2023-05-17 MED ORDER — AZITHROMYCIN 500 MG PO TABS: 500.0000 mg | ORAL_TABLET | Freq: Every day | ORAL | 0 refills | Status: DC

## 2023-05-18 ENCOUNTER — Other Ambulatory Visit: Payer: Self-pay

## 2023-05-18 DIAGNOSIS — K5010 Crohn's disease of large intestine without complications: Secondary | ICD-10-CM

## 2023-05-18 LAB — CALPROTECTIN, FECAL: Calprotectin, Fecal: 2750 ug/g — ABNORMAL HIGH (ref 0–120)

## 2023-05-25 ENCOUNTER — Telehealth: Payer: Self-pay | Admitting: Physician Assistant

## 2023-05-25 NOTE — Telephone Encounter (Signed)
Pts appt for the 17th was an old appt. Discussed with pt that we cancel the appt. Appt cancelled and pt aware.

## 2023-05-25 NOTE — Telephone Encounter (Signed)
Patient called stated he received a MyChart message to confirm an appt however he was not aware of the appt scheduled for 06/01/23- He also has an appt for banding on 07/11/23 and a colonoscopy for 06/06/23 the patient would like to discuss these appts with you and requested a call back.

## 2023-06-01 ENCOUNTER — Ambulatory Visit: Payer: BC Managed Care – PPO | Admitting: Internal Medicine

## 2023-06-01 NOTE — Telephone Encounter (Signed)
Spoke with pt and he thinks he has covid, being tested tomorrow. His wife has it now. He wanted to let you know and that he has rescheduled his colon to Sept.

## 2023-06-02 ENCOUNTER — Ambulatory Visit
Admission: EM | Admit: 2023-06-02 | Discharge: 2023-06-02 | Disposition: A | Discharge status: Home / Self Care | Payer: BC Managed Care – PPO | Attending: Family Medicine | Admitting: Family Medicine

## 2023-06-02 DIAGNOSIS — J069 Acute upper respiratory infection, unspecified: Secondary | ICD-10-CM | POA: Insufficient documentation

## 2023-06-02 DIAGNOSIS — Z20822 Contact with and (suspected) exposure to covid-19: Secondary | ICD-10-CM | POA: Insufficient documentation

## 2023-06-02 LAB — POCT RAPID STREP A (OFFICE): Rapid Strep A Screen: NEGATIVE

## 2023-06-02 MED ORDER — ALBUTEROL SULFATE HFA 108 (90 BASE) MCG/ACT IN AERS: 2.0000 | INHALATION_SPRAY | RESPIRATORY_TRACT | 0 refills | Status: DC | PRN

## 2023-06-02 MED ORDER — PAXLOVID (300/100) 20 X 150 MG & 10 X 100MG PO TBPK: 3.0000 | ORAL_TABLET | Freq: Two times a day (BID) | ORAL | 0 refills | Status: AC

## 2023-06-02 NOTE — ED Triage Notes (Signed)
Pt reports he has a sore throat and body aches x 3 days   Took tylenol.  Wife has coivd

## 2023-06-02 NOTE — ED Provider Notes (Signed)
[facility]    CSN: [id number] Arrival date & time: 06/02/23  1051      History   Chief Complaint Chief Complaint  Patient presents with   Sore Throat    HPI Hunter Edwards is a 67 y.o. male.   Patient presenting today with 3-day history of sore throat, sinus drainage, mild cough, chest tightness, chills, body aches.  Denies chest pain, shortness of breath, abdominal pain, nausea vomiting or diarrhea.  So far taking Tylenol with minimal relief.  Wife has COVID currently.    Past Medical History:  Diagnosis Date   Anal fistula    Anorectal fistula    Atrial fibrillation (HCC) 2016   Benign hypertension    Cataract    Chest pain    Colitis    Contact dermatitis    Crohn's disease (HCC) 2011   Disease of jaw    Dysrhythmia 01/14/15   AFib   Fatigue    Folliculitis    GERD (gastroesophageal reflux disease)    History of Holter monitoring    Will be done on Friday, 01/17/15   Hyperlipidemia    IDA (iron deficiency anemia)    Need for Tdap vaccination    Neoplasm of prostate    Palpitations    Perirectal abscess    Rash    Rectal discharge     Patient Active Problem List   Diagnosis Date Noted   Wheezing 07/14/2022   DOE (dyspnea on exertion) 07/14/2022   PND (post-nasal drip) 02/03/2022   Eczema 02/03/2022   Skin irritation 03/17/2021   Crohn's disease of colon with complication (HCC) - Dr Rhea Belton 03/16/2021   Hyperlipidemia 06/18/2020   ED (erectile dysfunction) 06/17/2020   Prediabetes 06/16/2020   Rash and nonspecific skin eruption 05/29/2018   Muscle cramp 04/14/2018   Essential hypertension 06/11/2015   Paroxysmal atrial fibrillation (HCC) 05/07/2015   GERD (gastroesophageal reflux disease) 05/07/2015    Past Surgical History:  Procedure Laterality Date   ANAL FISTULECTOMY N/A 01/24/2015   Procedure: FISTULECTOMY ANAL;  Surgeon: Romie Levee, MD;  Location: [facility];  Service: General;  Laterality: N/A;   RECTAL  EXAM UNDER ANESTHESIA N/A 01/24/2015   Procedure: ANAL  EXAM UNDER ANESTHESIA;  Surgeon: Romie Levee, MD;  Location: [facility];  Service: General;  Laterality: N/A;   TONSILLECTOMY  1962       Home Medications    Prior to Admission medications   Medication Sig Start Date End Date Taking? Authorizing Provider  albuterol (VENTOLIN HFA) 108 (90 Base) MCG/ACT inhaler Inhale 2 puffs into the lungs every 4 (four) hours as needed for wheezing or shortness of breath. 06/02/23  Yes Particia Nearing, PA-C  nirmatrelvir & ritonavir (PAXLOVID, 300/100,) 20 x 150 MG & 10 x 100MG  TBPK Take 3 tablets by mouth 2 (two) times daily for 5 days. 06/02/23 06/07/23 Yes Particia Nearing, PA-C  amLODipine (NORVASC) 2.5 MG tablet Take 2.5 mg by mouth daily.    [provider]  aspirin 81 MG tablet Take 81 mg by mouth daily.    [provider]  azithromycin (ZITHROMAX) 500 MG tablet Take 1 tablet (500 mg total) by mouth daily. 05/17/23   Pyrtle, Carie Caddy, MD  Bee Pollen 550 MG CAPS Take 2 capsules by mouth daily.    [provider]  Budesonide ER (UCERIS) 9 MG TB24 Take 1 tablet (9 mg total) by mouth daily. 05/16/23   Pyrtle, Carie Caddy, MD  losartan (  COZAAR) 25 MG tablet Take 1 tablet by mouth daily. 03/17/17   [provider]  Multiple Vitamins-Minerals (MULTIVITAMIN MEN PO) Take by mouth.    [provider]  pantoprazole (PROTONIX) 40 MG tablet TAKE 1 TABLET BY MOUTH TWICE A DAY 05/02/23   Pyrtle, Carie Caddy, MD  sildenafil (VIAGRA) 100 MG tablet Take 0.5-1 tablets (50-100 mg total) by mouth daily as needed for erectile dysfunction. 06/17/20   Pincus Sanes, MD  triamcinolone ointment (KENALOG) 0.5 % APPLY 1 APPLICATION. TOPICALLY 2 (TWO) TIMES DAILY. 06/30/22   Pincus Sanes, MD    Family History Family History  Problem Relation Age of Onset   Stomach cancer Father        died in his 70's   Diabetes Brother    Heart disease Brother    Colon cancer Neg Hx     Colon polyps Neg Hx    Kidney disease Neg Hx    Esophageal cancer Neg Hx    Gallbladder disease Neg Hx     Social History Social History   Tobacco Use   Smoking status: Former    Current packs/day: 0.00    Types: Cigarettes    Quit date: 11/15/1996    Years since quitting: 26.5   Smokeless tobacco: Never  Vaping Use   Vaping status: Never Used  Substance Use Topics   Alcohol use: No    Alcohol/week: 0.0 standard drinks of alcohol    Comment: Rarely   Drug use: No     Allergies   Statins, Sulfa antibiotics, Sulfamethoxazole, and Entyvio [vedolizumab]   Review of Systems Review of Systems Per HPI  Physical Exam Triage Vital Signs ED Triage Vitals  Encounter Vitals Group     BP 06/02/23 1131 137/87     Systolic BP Percentile --      Diastolic BP Percentile --      Pulse Rate 06/02/23 1131 73     Resp 06/02/23 1131 18     Temp 06/02/23 1131 98.1 F (36.7 C)     Temp Source 06/02/23 1131 Oral     SpO2 06/02/23 1131 98 %     Weight --      Height --      Head Circumference --      Peak Flow --      Pain Score 06/02/23 1133 8     Pain Loc --      Pain Education --      Exclude from Growth Chart --    No data found.  Updated Vital Signs BP 137/87 (BP Location: Right Arm)   Pulse 73   Temp 98.1 F (36.7 C) (Oral)   Resp 18   SpO2 98%   Visual Acuity Right Eye Distance:   Left Eye Distance:   Bilateral Distance:    Right Eye Near:   Left Eye Near:    Bilateral Near:     Physical Exam Vitals and nursing note reviewed.  Constitutional:      Appearance: He is well-developed.  HENT:     Head: Atraumatic.     Right Ear: External ear normal.     Left Ear: External ear normal.     Nose: Rhinorrhea present.     Mouth/Throat:     Pharynx: Posterior oropharyngeal erythema present. No oropharyngeal exudate.  Eyes:     Conjunctiva/sclera: Conjunctivae normal.     Pupils: Pupils are equal, round, and reactive to light.  Cardiovascular:     Rate  and  Rhythm: Normal rate and regular rhythm.  Pulmonary:     Effort: Pulmonary effort is normal. No respiratory distress.     Breath sounds: No wheezing or rales.  Musculoskeletal:        General: Normal range of motion.     Cervical back: Normal range of motion and neck supple.  Lymphadenopathy:     Cervical: No cervical adenopathy.  Skin:    General: Skin is warm and dry.  Neurological:     Mental Status: He is alert and oriented to person, place, and time.  Psychiatric:        Behavior: Behavior normal.      UC Treatments / Results  Labs (all labs ordered are listed, but only abnormal results are displayed) Labs Reviewed  SARS CORONAVIRUS 2 (TAT 6-24 HRS)  POCT RAPID STREP A (OFFICE)    EKG   Radiology No results found.  Procedures Procedures (including critical care time)  Medications Ordered in UC Medications - No data to display  Initial Impression / Assessment and Plan / UC Course  I have reviewed the triage vital signs and the nursing notes.  Pertinent labs & imaging results that were available during my care of the patient were reviewed by me and considered in my medical decision making (see chart for details).     Vitals and exam overall reassuring and indicative of a viral upper respiratory infection.  Suspect COVID given home exposure.  Rapid strep negative, COVID testing pending.  Treat with Paxlovid, supportive over-the-counter medications and home care.  Return for worsening symptoms.  Final Clinical Impressions(s) / UC Diagnoses   Final diagnoses:  Viral URI with cough  Exposure to COVID-19 virus   Discharge Instructions   None    ED Prescriptions     Medication Sig Dispense Auth. Provider   nirmatrelvir & ritonavir (PAXLOVID, 300/100,) 20 x 150 MG & 10 x 100MG  TBPK Take 3 tablets by mouth 2 (two) times daily for 5 days. 30 tablet Particia Nearing, [place]   albuterol (VENTOLIN HFA) 108 (90 Base) MCG/ACT inhaler Inhale 2 puffs into the lungs  every 4 (four) hours as needed for wheezing or shortness of breath. 18 g Particia Nearing, [place]      PDMP not reviewed this encounter.   Particia Nearing, [place] 06/02/23 1209

## 2023-06-03 LAB — SARS CORONAVIRUS 2 (TAT 6-24 HRS): SARS Coronavirus 2: POSITIVE — AB

## 2023-06-06 ENCOUNTER — Encounter: Payer: BC Managed Care – PPO | Admitting: Internal Medicine

## 2023-06-20 ENCOUNTER — Telehealth: Payer: Self-pay | Admitting: *Deleted

## 2023-06-20 ENCOUNTER — Telehealth: Payer: Self-pay

## 2023-06-20 NOTE — Telephone Encounter (Signed)
Left detailed message on patient's voicemail explaining patient needs to call and reschedule his appointment until after colonoscopy.

## 2023-06-20 NOTE — Telephone Encounter (Signed)
-----   Message from Nurse Kerrie Buffalo sent at 06/20/2023  4:03 PM EDT ----- Regarding: FW: Lab  ----- Message ----- From: Chrystie Nose, RN Sent: 06/18/2023  12:00 AM EDT To: Chrystie Nose, RN Subject: Lab                                            Pt needs repeat fecal calprotectin, order in epic.

## 2023-06-20 NOTE — Telephone Encounter (Signed)
-----   Message from Carie Caddy Pyrtle sent at 06/20/2023  4:06 PM EDT ----- Yes, and with his hx of Crohn's we should see what colonoscopy reveals before considering banding JMP  Thanks ----- Message ----- From: Illene Bolus, CMA Sent: 06/20/2023   3:38 PM EDT To: Beverley Fiedler, MD  Patient is scheduled for an appt with you to discuss possible hemorrhoid banding on 8/26. Patient rescheduled his colon to September due to covid. Do you want me to reschedule patient's banding until after the colonoscopy?

## 2023-06-20 NOTE — Telephone Encounter (Signed)
Called patient and nurse was informed that patient had already had the Calprotectin Fecal stool test and states "we already know that I have Crohn's Disease." Called patient back to question whether or not he was still having symptoms. Patient states he is not having any symptoms and no longer having diarrhea.

## 2023-06-21 NOTE — Telephone Encounter (Signed)
Left message for patient to return my call to confirm he received my detailed message to reschedule.

## 2023-06-21 NOTE — Telephone Encounter (Signed)
Patient rescheduled office appt until 10/20/23.

## 2023-06-22 ENCOUNTER — Other Ambulatory Visit: Payer: Self-pay | Admitting: Internal Medicine

## 2023-06-24 ENCOUNTER — Other Ambulatory Visit: Payer: Self-pay | Admitting: Family Medicine

## 2023-06-24 NOTE — Telephone Encounter (Signed)
Urgent Care patient Requested Prescriptions  Pending Prescriptions Disp Refills   albuterol (VENTOLIN HFA) 108 (90 Base) MCG/ACT inhaler [Pharmacy Med Name: ALBUTEROL HFA (PROVENTIL) INH] 6.7 each     Sig: INHALE 2 PUFFS INTO THE LUNGS EVERY 4 HOURS AS NEEDED FOR WHEEZING OR SHORTNESS OF BREATH.     There is no refill protocol information for this order

## 2023-06-30 ENCOUNTER — Encounter (INDEPENDENT_AMBULATORY_CARE_PROVIDER_SITE_OTHER): Payer: Self-pay

## 2023-07-01 ENCOUNTER — Encounter: Payer: Self-pay | Admitting: Internal Medicine

## 2023-07-11 ENCOUNTER — Ambulatory Visit: Payer: BC Managed Care – PPO | Admitting: Internal Medicine

## 2023-07-13 NOTE — Telephone Encounter (Signed)
Patient came by to do labs, but orders were not in, he asked if they could be put in so he can do them before his appointment on 07/20/23. He says we can call or send a MyChart message when the lab orders are in. Best callback is [phone].

## 2023-07-14 ENCOUNTER — Other Ambulatory Visit: Payer: Self-pay

## 2023-07-14 DIAGNOSIS — Z125 Encounter for screening for malignant neoplasm of prostate: Secondary | ICD-10-CM

## 2023-07-14 DIAGNOSIS — Z0000 Encounter for general adult medical examination without abnormal findings: Secondary | ICD-10-CM

## 2023-07-14 DIAGNOSIS — R7303 Prediabetes: Secondary | ICD-10-CM

## 2023-07-14 DIAGNOSIS — I10 Essential (primary) hypertension: Secondary | ICD-10-CM

## 2023-07-14 DIAGNOSIS — E782 Mixed hyperlipidemia: Secondary | ICD-10-CM

## 2023-07-19 NOTE — Progress Notes (Signed)
Subjective:    Patient ID: Hunter Edwards, male    DOB: 1956/09/15, 67 y.o.   MRN: [id number]     HPI Hunter Edwards is here for a physical exam and his chronic medical problems.   Doing well - no concerns.  Had labs done.     Medications and allergies reviewed with patient and updated if appropriate.  Current Outpatient Medications on File Prior to Visit  Medication Sig Dispense Refill   albuterol (VENTOLIN HFA) 108 (90 Base) MCG/ACT inhaler Inhale 2 puffs into the lungs every 4 (four) hours as needed for wheezing or shortness of breath. 18 g 0   aspirin 81 MG tablet Take 81 mg by mouth daily.     Bee Pollen 550 MG CAPS Take 2 capsules by mouth daily.     Budesonide ER (UCERIS) 9 MG TB24 Take 1 tablet (9 mg total) by mouth daily. 56 tablet 0   loratadine (CLARITIN) 10 MG tablet Take by mouth.     losartan (COZAAR) 25 MG tablet Take 1 tablet by mouth daily.     Multiple Vitamins-Minerals (MULTIVITAMIN MEN PO) Take by mouth.     pantoprazole (PROTONIX) 40 MG tablet TAKE 1 TABLET BY MOUTH TWICE A DAY 180 tablet 1   triamcinolone ointment (KENALOG) 0.5 % APPLY 1 APPLICATION TOPICALLY TWICE A DAY 30 g 0   No current facility-administered medications on file prior to visit.    Review of Systems  Constitutional:  Negative for fever.  HENT:  Positive for postnasal drip (allergy related).   Eyes:  Positive for visual disturbance (one episode left eye - once).  Respiratory:  Negative for cough, shortness of breath and wheezing.   Cardiovascular:  Negative for chest pain, palpitations and leg swelling.  Gastrointestinal:  Negative for abdominal pain, blood in stool, constipation and diarrhea.       No gerd  Genitourinary:  Negative for difficulty urinating, dysuria and hematuria.  Musculoskeletal:  Positive for arthralgias (left shoulder, knees) and back pain.  Skin:  Positive for rash (eczema).  Neurological:  Negative for light-headedness and headaches.  Psychiatric/Behavioral:   Negative for dysphoric mood. The patient is not nervous/anxious.        Objective:   Vitals:   07/20/23 1427  BP: 130/76  Pulse: 74  Temp: 98.1 F (36.7 C)  SpO2: 97%   Filed Weights   07/20/23 1427  Weight: 176 lb (79.8 kg)   Body mass index is 27.98 kg/m.  BP Readings from Last 3 Encounters:  07/20/23 130/76  06/02/23 137/87  03/14/23 (!) 142/84    Wt Readings from Last 3 Encounters:  07/20/23 176 lb (79.8 kg)  03/14/23 184 lb (83.5 kg)  07/14/22 182 lb (82.6 kg)      Physical Exam Constitutional: He appears well-developed and well-nourished. No distress.  HENT:  Head: Normocephalic and atraumatic.  Right Ear: External ear normal.  Left Ear: External ear normal.  Normal ear canals and TM b/l  Mouth/Throat: Oropharynx is clear and moist. Eyes: Conjunctivae and EOM are normal.  Neck: Neck supple. No tracheal deviation present. No thyromegaly present.  No carotid bruit  Cardiovascular: Normal rate, regular rhythm, normal heart sounds and intact distal pulses.   No murmur heard.  No lower extremity edema. Pulmonary/Chest: Effort normal and breath sounds normal. No respiratory distress. He has no wheezes. He has no rales.  Abdominal: Soft. He exhibits no distension. There is no tenderness.  Genitourinary: deferred  Lymphadenopathy:   He has no  cervical adenopathy.  Skin: Skin is warm and dry. He is not diaphoretic.  Patches of dry mild erythematous  Psychiatric: He has a normal mood and affect. His behavior is normal.         Assessment & Plan:   Physical exam: Screening blood work  ordered Exercise   yard work, work [employer]  is good Substance abuse   none   Reviewed recommended immunizations.   Health Maintenance  Topic Date Due   Zoster Vaccines- Shingrix (1 of 2) Never done   Colonoscopy  04/04/2022   INFLUENZA VACCINE  06/16/2023   COVID-19 Vaccine (2 - 2023-24 season) 07/17/2023   DTaP/Tdap/Td (2 - Td or Tdap) 06/17/2026   Pneumonia  Vaccine 69+ Years old  Completed   Hepatitis C Screening  Completed   HPV VACCINES  Aged Out    Colonoscopy scheduled.   See Problem List for Assessment and Plan of chronic medical problems.

## 2023-07-19 NOTE — Patient Instructions (Addendum)
Medications changes include :   none     Return in about 1 year (around 07/19/2024) for Physical Exam.   Health Maintenance, Male Adopting a healthy lifestyle and getting preventive care are important in promoting health and wellness. Ask your health care provider about: The right schedule for you to have regular tests and exams. Things you can do on your own to prevent diseases and keep yourself healthy. What should I know about diet, weight, and exercise? Eat a healthy diet  Eat a diet that includes plenty of vegetables, fruits, low-fat dairy products, and lean protein. Do not eat a lot of foods that are high in solid fats, added sugars, or sodium. Maintain a healthy weight Body mass index (BMI) is a measurement that can be used to identify possible weight problems. It estimates body fat based on height and weight. Your health care provider can help determine your BMI and help you achieve or maintain a healthy weight. Get regular exercise Get regular exercise. This is one of the most important things you can do for your health. Most adults should: Exercise for at least 150 minutes each week. The exercise should increase your heart rate and make you sweat (moderate-intensity exercise). Do strengthening exercises at least twice a week. This is in addition to the moderate-intensity exercise. Spend less time sitting. Even light physical activity can be beneficial. Watch cholesterol and blood lipids Have your blood tested for lipids and cholesterol at 68 years of age, then have this test every 5 years. You may need to have your cholesterol levels checked more often if: Your lipid or cholesterol levels are high. You are older than 68 years of age. You are at high risk for heart disease. What should I know about cancer screening? Many types of cancers can be detected early and may often be prevented. Depending on your health history and family history, you may need to have cancer  screening at various ages. This may include screening for: Colorectal cancer. Prostate cancer. Skin cancer. Lung cancer. What should I know about heart disease, diabetes, and high blood pressure? Blood pressure and heart disease High blood pressure causes heart disease and increases the risk of stroke. This is more likely to develop in people who have high blood pressure readings or are overweight. Talk with your health care provider about your target blood pressure readings. Have your blood pressure checked: Every 3-5 years if you are 42-62 years of age. Every year if you are 62 years old or older. If you are between the ages of 69 and 2 and are a current or former smoker, ask your health care provider if you should have a one-time screening for abdominal aortic aneurysm (AAA). Diabetes Have regular diabetes screenings. This checks your fasting blood sugar level. Have the screening done: Once every three years after age 54 if you are at a normal weight and have a low risk for diabetes. More often and at a younger age if you are overweight or have a high risk for diabetes. What should I know about preventing infection? Hepatitis B If you have a higher risk for hepatitis B, you should be screened for this virus. Talk with your health care provider to find out if you are at risk for hepatitis B infection. Hepatitis C Blood testing is recommended for: Everyone born from [birth year] through 1966. Anyone with known risk factors for hepatitis C. Sexually transmitted infections (STIs) You should be screened each year for STIs,  including gonorrhea and chlamydia, if: You are sexually active and are younger than 68 years of age. You are older than 68 years of age and your health care provider tells you that you are at risk for this type of infection. Your sexual activity has changed since you were last screened, and you are at increased risk for chlamydia or gonorrhea. Ask your health care provider if  you are at risk. Ask your health care provider about whether you are at high risk for HIV. Your health care provider may recommend a prescription medicine to help prevent HIV infection. If you choose to take medicine to prevent HIV, you should first get tested for HIV. You should then be tested every 3 months for as long as you are taking the medicine. Follow these instructions at home: Alcohol use Do not drink alcohol if your health care provider tells you not to drink. If you drink alcohol: Limit how much you have to 0-2 drinks a day. Know how much alcohol is in your drink. In the [place] one drink equals one 12 oz bottle of beer (355 mL), one 5 oz glass of wine (148 mL), or one 1 oz glass of hard liquor (44 mL). Lifestyle Do not use any products that contain nicotine or tobacco. These products include cigarettes, chewing tobacco, and vaping devices, such as e-cigarettes. If you need help quitting, ask your health care provider. Do not use street drugs. Do not share needles. Ask your health care provider for help if you need support or information about quitting drugs. General instructions Schedule regular health, dental, and eye exams. Stay current with your vaccines. Tell your health care provider if: You often feel depressed. You have ever been abused or do not feel safe at home. Summary Adopting a healthy lifestyle and getting preventive care are important in promoting health and wellness. Follow your health care provider's instructions about healthy diet, exercising, and getting tested or screened for diseases. Follow your health care provider's instructions on monitoring your cholesterol and blood pressure. This information is not intended to replace advice given to you by your health care provider. Make sure you discuss any questions you have with your health care provider. Document Revised: 03/23/2021 Document Reviewed: 03/23/2021 Elsevier Patient Education  2024 [employer].

## 2023-07-20 ENCOUNTER — Ambulatory Visit (INDEPENDENT_AMBULATORY_CARE_PROVIDER_SITE_OTHER): Payer: BC Managed Care – PPO | Admitting: Internal Medicine

## 2023-07-20 ENCOUNTER — Other Ambulatory Visit (INDEPENDENT_AMBULATORY_CARE_PROVIDER_SITE_OTHER): Payer: BC Managed Care – PPO

## 2023-07-20 ENCOUNTER — Encounter: Payer: Self-pay | Admitting: Internal Medicine

## 2023-07-20 ENCOUNTER — Other Ambulatory Visit: Payer: Self-pay

## 2023-07-20 VITALS — BP 130/76 | HR 74 | Temp 98.1°F | Ht 66.5 in | Wt 176.0 lb

## 2023-07-20 DIAGNOSIS — Z0000 Encounter for general adult medical examination without abnormal findings: Secondary | ICD-10-CM | POA: Diagnosis not present

## 2023-07-20 DIAGNOSIS — R7303 Prediabetes: Secondary | ICD-10-CM

## 2023-07-20 DIAGNOSIS — Z125 Encounter for screening for malignant neoplasm of prostate: Secondary | ICD-10-CM | POA: Diagnosis not present

## 2023-07-20 DIAGNOSIS — K219 Gastro-esophageal reflux disease without esophagitis: Secondary | ICD-10-CM

## 2023-07-20 DIAGNOSIS — I480 Paroxysmal atrial fibrillation: Secondary | ICD-10-CM | POA: Diagnosis not present

## 2023-07-20 DIAGNOSIS — I10 Essential (primary) hypertension: Secondary | ICD-10-CM

## 2023-07-20 DIAGNOSIS — E782 Mixed hyperlipidemia: Secondary | ICD-10-CM

## 2023-07-20 DIAGNOSIS — K50119 Crohn's disease of large intestine with unspecified complications: Secondary | ICD-10-CM | POA: Diagnosis not present

## 2023-07-20 LAB — COMPREHENSIVE METABOLIC PANEL
ALT: 15 U/L (ref 0–53)
AST: 20 U/L (ref 0–37)
Albumin: 3.9 g/dL (ref 3.5–5.2)
Alkaline Phosphatase: 78 U/L (ref 39–117)
BUN: 15 mg/dL (ref 6–23)
CO2: 31 meq/L (ref 19–32)
Calcium: 9.5 mg/dL (ref 8.4–10.5)
Chloride: 101 meq/L (ref 96–112)
Creatinine, Ser: 1.19 mg/dL (ref 0.40–1.50)
GFR: 62.95 mL/min (ref >60.00)
Glucose, Bld: 84 mg/dL (ref 70–99)
Potassium: 4.3 meq/L (ref 3.5–5.1)
Sodium: 138 meq/L (ref 135–145)
Total Bilirubin: 1.1 mg/dL (ref 0.2–1.2)
Total Protein: 6.9 g/dL (ref 6.0–8.3)

## 2023-07-20 LAB — PSA: PSA: 1.06 ng/mL (ref 0.10–4.00)

## 2023-07-20 LAB — LIPID PANEL
Cholesterol: 197 mg/dL (ref 0–200)
HDL: 52.4 mg/dL (ref >39.00)
LDL Cholesterol: 127 mg/dL — ABNORMAL HIGH (ref 0–99)
NonHDL: 144.87
Total CHOL/HDL Ratio: 4
Triglycerides: 88 mg/dL (ref 0.0–149.0)
VLDL: 17.6 mg/dL (ref 0.0–40.0)

## 2023-07-20 LAB — CBC WITH DIFFERENTIAL/PLATELET
Basophils Absolute: 0.1 10*3/uL (ref 0.0–0.1)
Basophils Relative: 0.7 % (ref 0.0–3.0)
Eosinophils Absolute: 0.3 10*3/uL (ref 0.0–0.7)
Eosinophils Relative: 2.9 % (ref 0.0–5.0)
HCT: 40.1 % (ref 39.0–52.0)
Hemoglobin: 13.4 g/dL (ref 13.0–17.0)
Lymphocytes Relative: 20.7 % (ref 12.0–46.0)
Lymphs Abs: 2.1 10*3/uL (ref 0.7–4.0)
MCHC: 33.5 g/dL (ref 30.0–36.0)
MCV: 90.2 fl (ref 78.0–100.0)
Monocytes Absolute: 0.8 10*3/uL (ref 0.1–1.0)
Monocytes Relative: 7.7 % (ref 3.0–12.0)
Neutro Abs: 6.8 10*3/uL (ref 1.4–7.7)
Neutrophils Relative %: 68 % (ref 43.0–77.0)
Platelets: 274 10*3/uL (ref 150.0–400.0)
RBC: 4.45 Mil/uL (ref 4.22–5.81)
RDW: 13.7 % (ref 11.5–15.5)
WBC: 10 10*3/uL (ref 4.0–10.5)

## 2023-07-20 LAB — TSH: TSH: 1.74 u[IU]/mL (ref 0.35–5.50)

## 2023-07-20 NOTE — Assessment & Plan Note (Signed)
Chronic Following with GI Colonoscopy scheduled Controlled On budesonide ER 9 mg daily

## 2023-07-20 NOTE — Assessment & Plan Note (Signed)
Chronic Check a1c Low sugar / carb diet Stressed regular exercise  

## 2023-07-20 NOTE — Assessment & Plan Note (Signed)
Chronic BP well controlled Continue losartan 25 mg daily Cmp, cbc

## 2023-07-20 NOTE — Assessment & Plan Note (Signed)
H/o afib s/p ablation Follows with cardio On ASA 81 mg daily

## 2023-07-20 NOTE — Assessment & Plan Note (Signed)
Chronic GERD controlled Continue protonix 40 mg daily  

## 2023-07-20 NOTE — Assessment & Plan Note (Signed)
Chronic Check lipid panel  Lab Results  Component Value Date   LDLCALC 125 (H) 07/14/2022   Continue lifestyle controlled Regular exercise and healthy diet encouraged

## 2023-07-21 ENCOUNTER — Other Ambulatory Visit (INDEPENDENT_AMBULATORY_CARE_PROVIDER_SITE_OTHER): Payer: BC Managed Care – PPO

## 2023-07-21 ENCOUNTER — Other Ambulatory Visit: Payer: Self-pay

## 2023-07-21 DIAGNOSIS — R7303 Prediabetes: Secondary | ICD-10-CM | POA: Diagnosis not present

## 2023-07-21 DIAGNOSIS — K50119 Crohn's disease of large intestine with unspecified complications: Secondary | ICD-10-CM

## 2023-07-21 LAB — HEMOGLOBIN A1C: Hgb A1c MFr Bld: 5.6 % (ref 4.6–6.5)

## 2023-07-21 LAB — IBC PANEL
Iron: 61 ug/dL (ref 42–165)
Saturation Ratios: 19.5 % — ABNORMAL LOW (ref 20.0–50.0)
TIBC: 312.2 ug/dL (ref 250.0–450.0)
Transferrin: 223 mg/dL (ref 212.0–360.0)

## 2023-07-21 LAB — FERRITIN: Ferritin: 33.8 ng/mL (ref 22.0–322.0)

## 2023-07-30 ENCOUNTER — Encounter: Payer: Self-pay | Admitting: Certified Registered Nurse Anesthetist

## 2023-08-08 ENCOUNTER — Ambulatory Visit: Payer: BC Managed Care – PPO | Admitting: Internal Medicine

## 2023-08-08 ENCOUNTER — Encounter: Payer: Self-pay | Admitting: Internal Medicine

## 2023-08-08 VITALS — BP 110/69 | HR 58 | Temp 98.7°F | Resp 12 | Ht 66.0 in | Wt 184.0 lb

## 2023-08-08 DIAGNOSIS — K5010 Crohn's disease of large intestine without complications: Secondary | ICD-10-CM

## 2023-08-08 DIAGNOSIS — D122 Benign neoplasm of ascending colon: Secondary | ICD-10-CM | POA: Diagnosis not present

## 2023-08-08 DIAGNOSIS — D120 Benign neoplasm of cecum: Secondary | ICD-10-CM

## 2023-08-08 MED ORDER — SODIUM CHLORIDE 0.9 % IV SOLN: 500.0000 mL | Freq: Once | INTRAVENOUS | Status: DC

## 2023-08-08 NOTE — Progress Notes (Signed)
Report given to PACU, vss 

## 2023-08-08 NOTE — Op Note (Signed)
[facility] Patient Name: Onecimo Purchase Procedure Date: 08/08/2023 2:32 PM MRN: [id number] Endoscopist: Beverley Fiedler , MD, [id number] Age: 67 Referring MD:  Date of Birth: 03-Feb-1956 Gender: Male Account #: [id number] Procedure:                Colonoscopy Indications:              Crohn's disease of the colon with perianal                            involvement (hx of fistula); diagnosis 2011;                            current therapy: None, fecal cal 2750 in June but                            during acute illness (food poisoning); last exam                            May 2021 Medicines:                Monitored Anesthesia Care Procedure:                Pre-Anesthesia Assessment:                           - Prior to the procedure, a History and Physical                            was performed, and patient medications and                            allergies were reviewed. The patient's tolerance of                            previous anesthesia was also reviewed. The risks                            and benefits of the procedure and the sedation                            options and risks were discussed with the patient.                            All questions were answered, and informed consent                            was obtained. Prior Anticoagulants: The patient has                            taken no anticoagulant or antiplatelet agents. ASA                            Grade Assessment: II - A patient with mild systemic  disease. After reviewing the risks and benefits,                            the patient was deemed in satisfactory condition to                            undergo the procedure.                           After obtaining informed consent, the colonoscope                            was passed under direct vision. Throughout the                            procedure, the patient's blood pressure, pulse, and                             oxygen saturations were monitored continuously. The                            Olympus CF-HQ190L ([id number]) Colonoscope was                            introduced through the anus and advanced to the                            terminal ileum. Scope In: 2:39:08 PM Scope Out: 2:54:15 PM Scope Withdrawal Time: 0 hours 12 minutes 40 seconds  Total Procedure Duration: 0 hours 15 minutes 7 seconds  Findings:                 The digital rectal exam was normal.                           The terminal ileum appeared normal.                           A 2 mm polyp was found in the ileocecal valve. The                            polyp was sessile. The polyp was removed with a                            cold biopsy forceps. Resection and retrieval were                            complete.                           A 5 mm polyp was found in the ascending colon. The                            polyp was sessile. The polyp was removed with a  cold snare. Resection and retrieval were complete.                           The Simple Endoscopic Score for Crohn's Disease was                            determined based on the endoscopic appearance of                            the mucosa in the following segments:                           - Ileum: Findings include no ulcers present, no                            ulcerated surfaces, no affected surfaces and no                            narrowings. Segment score: 0.                           - Right Colon: Findings include no ulcers present,                            no ulcerated surfaces, no affected surfaces and no                            narrowings. Segment score: 0.                           - Transverse Colon: Findings include no ulcers                            present, no ulcerated surfaces, no affected                            surfaces and no narrowings. Segment score: 0.                           - Left Colon:  Findings include no ulcers present,                            no ulcerated surfaces, no affected surfaces and no                            narrowings. Segment score: 0. Pseudopolyps present                            with mucosal scarring but without active                            inflammation                           - Rectum: Findings  include no ulcers present, no                            ulcerated surfaces, no affected surfaces and no                            narrowings. Segment score: 0.                           - Total SES-CD aggregate score: 0. Biopsies were                            taken with a cold forceps from the right colon,                            left colon and rectum for Crohn's disease                            surveillance and dysplasia surveillance. These                            biopsy specimens from the right colon and left                            colon were sent to Pathology.                           Internal hemorrhoids were found during                            retroflexion. The hemorrhoids were medium-sized. Complications:            No immediate complications. Estimated Blood Loss:     Estimated blood loss was minimal. Impression:               - The examined portion of the ileum was normal.                           - One 2 mm polyp at the ileocecal valve, removed                            with a cold biopsy forceps. Resected and retrieved.                           - One 5 mm polyp in the ascending colon, removed                            with a cold snare. Resected and retrieved.                           - Simple Endoscopic Score for Crohn's Disease: 0,                            no mucosal inflammatory changes secondary to  Crohn's disease, in remission. Biopsied.                           - Internal hemorrhoids. Recommendation:           - Patient has a contact number available for                             emergencies. The signs and symptoms of potential                            delayed complications were discussed with the                            patient. Return to normal activities tomorrow.                            Written discharge instructions were provided to the                            patient.                           - Resume previous diet.                           - Continue present medications.                           - Await pathology results.                           - Repeat colonoscopy is recommended for                            surveillance. The colonoscopy date will be                            determined after pathology results from today's                            exam become available for review. Beverley Fiedler, MD 08/08/2023 3:05:15 PM This report has been signed electronically.

## 2023-08-08 NOTE — Progress Notes (Signed)
Called to room to assist during endoscopic procedure.  Patient ID and intended procedure confirmed with present staff. Received instructions for my participation in the procedure from the performing physician.  

## 2023-08-08 NOTE — Progress Notes (Signed)
GASTROENTEROLOGY PROCEDURE H&P NOTE   Primary Care Physician: Pincus Sanes, MD    Reason for Procedure:  Crohn's disease  Plan:    Colonoscopy  Patient is appropriate for endoscopic procedure(s) in the ambulatory ([facility]) setting.  The nature of the procedure, as well as the risks, benefits, and alternatives were carefully and thoroughly reviewed with the patient. Ample time for discussion and questions allowed. The patient understood, was satisfied, and agreed to proceed.     HPI: Hunter Edwards is a 67 y.o. male who presents for colonoscopy.  Medical history as below.  Tolerated the prep.  No recent chest pain or shortness of breath.  No abdominal pain today.  Past Medical History:  Diagnosis Date   Allergy    Anal fistula    Anorectal fistula    Arthritis    Atrial fibrillation (HCC) 11/15/2014   Benign hypertension    Cataract    Chest pain    Colitis    Contact dermatitis    Crohn's disease (HCC) 11/15/2009   Disease of jaw    Dysrhythmia 01/14/2015   AFib   Fatigue    Folliculitis    GERD (gastroesophageal reflux disease)    History of Holter monitoring    Will be done on Friday, 01/17/15   Hyperlipidemia    IDA (iron deficiency anemia)    Need for Tdap vaccination    Neoplasm of prostate    Palpitations    Perirectal abscess    Rash    Rectal discharge     Past Surgical History:  Procedure Laterality Date   ANAL FISTULECTOMY N/A 01/24/2015   Procedure: FISTULECTOMY ANAL;  Surgeon: Romie Levee, MD;  Location: [facility];  Service: General;  Laterality: N/A;   COLONOSCOPY     RECTAL EXAM UNDER ANESTHESIA N/A 01/24/2015   Procedure: ANAL  EXAM UNDER ANESTHESIA;  Surgeon: Romie Levee, MD;  Location: [facility];  Service: General;  Laterality: N/A;   TONSILLECTOMY  11/15/1960   UPPER GASTROINTESTINAL ENDOSCOPY      Prior to Admission medications   Medication Sig Start Date End Date Taking? Authorizing Provider   aspirin 81 MG tablet Take 81 mg by mouth daily.   Yes [provider]  Bee Pollen 550 MG CAPS Take 2 capsules by mouth daily.   Yes [provider]  loratadine (CLARITIN) 10 MG tablet Take by mouth. 03/03/22  Yes [provider]  losartan (COZAAR) 25 MG tablet Take 1 tablet by mouth daily. 03/17/17  Yes [provider]  Multiple Vitamins-Minerals (MULTIVITAMIN MEN PO) Take by mouth.   Yes [provider]  pantoprazole (PROTONIX) 40 MG tablet TAKE 1 TABLET BY MOUTH TWICE A DAY 05/02/23  Yes Colene Mines, Carie Caddy, MD  triamcinolone ointment (KENALOG) 0.5 % APPLY 1 APPLICATION TOPICALLY TWICE A DAY 06/22/23  Yes Burns, Bobette Mo, MD  albuterol (VENTOLIN HFA) 108 (90 Base) MCG/ACT inhaler Inhale 2 puffs into the lungs every 4 (four) hours as needed for wheezing or shortness of breath. 06/02/23   Particia Nearing, PA-C  Budesonide ER (UCERIS) 9 MG TB24 Take 1 tablet (9 mg total) by mouth daily. Patient not taking: Reported on 07/20/2023 05/16/23   Beverley Fiedler, MD    Current Outpatient Medications  Medication Sig Dispense Refill   aspirin 81 MG tablet Take 81 mg by mouth daily.     Bee Pollen 550 MG CAPS Take 2 capsules by mouth daily.  loratadine (CLARITIN) 10 MG tablet Take by mouth.     losartan (COZAAR) 25 MG tablet Take 1 tablet by mouth daily.     Multiple Vitamins-Minerals (MULTIVITAMIN MEN PO) Take by mouth.     pantoprazole (PROTONIX) 40 MG tablet TAKE 1 TABLET BY MOUTH TWICE A DAY 180 tablet 1   triamcinolone ointment (KENALOG) 0.5 % APPLY 1 APPLICATION TOPICALLY TWICE A DAY 30 g 0   albuterol (VENTOLIN HFA) 108 (90 Base) MCG/ACT inhaler Inhale 2 puffs into the lungs every 4 (four) hours as needed for wheezing or shortness of breath. 18 g 0   Budesonide ER (UCERIS) 9 MG TB24 Take 1 tablet (9 mg total) by mouth daily. (Patient not taking: Reported on 07/20/2023) 56 tablet 0   Current Facility-Administered Medications  Medication Dose Route Frequency  Provider Last Rate Last Admin   0.9 %  sodium chloride infusion  500 mL Intravenous Once Denny Lave, Carie Caddy, MD        Allergies as of 08/08/2023 - Review Complete 08/08/2023  Allergen Reaction Noted   Statins Other (See Comments) 04/09/2016   Sulfa antibiotics Other (See Comments) 01/15/2015   Sulfamethoxazole Other (See Comments) 04/09/2016   Entyvio [vedolizumab] Rash 05/14/2021    Family History  Problem Relation Age of Onset   Stomach cancer Father        died in his 45's   Diabetes Brother    Heart disease Brother    Colon cancer Neg Hx    Colon polyps Neg Hx    Kidney disease Neg Hx    Esophageal cancer Neg Hx    Gallbladder disease Neg Hx    Rectal cancer Neg Hx     Social History   Socioeconomic History   Marital status: Married    Spouse name: Not on file   Number of children: 1   Years of education: Not on file   Highest education level: 12th grade  Occupational History   Occupation: [employer] doctor  Tobacco Use   Smoking status: Former    Current packs/day: 0.00    Types: Cigarettes    Quit date: 11/15/1996    Years since quitting: 26.7   Smokeless tobacco: Never  Vaping Use   Vaping status: Never Used  Substance and Sexual Activity   Alcohol use: No    Alcohol/week: 0.0 standard drinks of alcohol    Comment: Rarely   Drug use: No   Sexual activity: Not Currently  Other Topics Concern   Not on file  Social History Narrative   Not on file   Social Determinants of Health   Financial Resource Strain: Low Risk  (07/16/2023)   Overall Financial Resource Strain (CARDIA)    Difficulty of Paying Living Expenses: Not hard at all  Food Insecurity: No Food Insecurity (07/16/2023)   Hunger Vital Sign    Worried About Running Out of Food in the Last Year: Never true    Ran Out of Food in the Last Year: Never true  Transportation Needs: No Transportation Needs (07/16/2023)   PRAPARE - Administrator, Civil Service (Medical): No    Lack of Transportation  (Non-Medical): No  Physical Activity: Insufficiently Active (07/16/2023)   Exercise Vital Sign    Days of Exercise per Week: 2 days    Minutes of Exercise per Session: 20 min  Stress: No Stress Concern Present (07/16/2023)   [employer] of Occupational Health - Occupational Stress Questionnaire    Feeling of Stress : Not at  all  Social Connections: Unknown (07/16/2023)   Social Connection and Isolation Panel [NHANES]    Frequency of Communication with Friends and Family: More than three times a week    Frequency of Social Gatherings with Friends and Family: Once a week    Attends Religious Services: Patient declined    Database administrator or Organizations: No    Attends Engineer, structural: Not on file    Marital Status: Married  Catering manager Violence: Not on file    Physical Exam: Vital signs in last 24 hours: @BP  121/84   Pulse 71   Temp 98.7 F (37.1 C) (Skin)   Ht 5\' 6"  (1.676 m)   Wt 184 lb (83.5 kg)   SpO2 97%   BMI 29.70 kg/m  GEN: NAD EYE: Sclerae anicteric ENT: MMM CV: Non-tachycardic Pulm: CTA b/l GI: Soft, NT/ND NEURO:  Alert & Oriented x 3   Erick Blinks, MD [facility]  08/08/2023 2:30 PM

## 2023-08-08 NOTE — Patient Instructions (Signed)
Please read handouts provided. Continue present medications. Await pathology results.   YOU HAD AN ENDOSCOPIC PROCEDURE TODAY AT THE [facility]:   Refer to the procedure report that was given to you for any specific questions about what was found during the examination.  If the procedure report does not answer your questions, please call your gastroenterologist to clarify.  If you requested that your care partner not be given the details of your procedure findings, then the procedure report has been included in a sealed envelope for you to review at your convenience later.  YOU SHOULD EXPECT: Some feelings of bloating in the abdomen. Passage of more gas than usual.  Walking can help get rid of the air that was put into your GI tract during the procedure and reduce the bloating. If you had a lower endoscopy (such as a colonoscopy or flexible sigmoidoscopy) you may notice spotting of blood in your stool or on the toilet paper. If you underwent a bowel prep for your procedure, you may not have a normal bowel movement for a few days.  Please Note:  You might notice some irritation and congestion in your nose or some drainage.  This is from the oxygen used during your procedure.  There is no need for concern and it should clear up in a day or so.  SYMPTOMS TO REPORT IMMEDIATELY:  Following lower endoscopy (colonoscopy or flexible sigmoidoscopy):  Excessive amounts of blood in the stool  Significant tenderness or worsening of abdominal pains  Swelling of the abdomen that is new, acute  Fever of 100F or higher  For urgent or emergent issues, a gastroenterologist can be reached at any hour by calling [phone]. Do not use MyChart messaging for urgent concerns.    DIET:  We do recommend a small meal at first, but then you may proceed to your regular diet.  Drink plenty of fluids but you should avoid alcoholic beverages for 24 hours.  ACTIVITY:  You should plan to take it easy for  the rest of today and you should NOT DRIVE or use heavy machinery until tomorrow (because of the sedation medicines used during the test).    FOLLOW UP: Our staff will call the number listed on your records the next business day following your procedure.  We will call around 7:15- 8:00 am to check on you and address any questions or concerns that you may have regarding the information given to you following your procedure. If we do not reach you, we will leave a message.     If any biopsies were taken you will be contacted by phone or by letter within the next 1-3 weeks.  Please call us at [phone] if you have not heard about the biopsies in 3 weeks.    SIGNATURES/CONFIDENTIALITY: You and/or your care partner have signed paperwork which will be entered into your electronic medical record.  These signatures attest to the fact that that the information above on your After Visit Summary has been reviewed and is understood.  Full responsibility of the confidentiality of this discharge information lies with you and/or your care-partner. 

## 2023-08-09 ENCOUNTER — Telehealth: Payer: Self-pay | Admitting: *Deleted

## 2023-08-09 NOTE — Telephone Encounter (Signed)
  Follow up Call-     08/08/2023    1:41 PM  Call back number  Post procedure Call Back phone  # [phone]  Permission to leave phone message Yes   [facility]

## 2023-08-11 LAB — SURGICAL PATHOLOGY

## 2023-08-12 ENCOUNTER — Encounter: Payer: Self-pay | Admitting: Internal Medicine

## 2023-09-25 ENCOUNTER — Other Ambulatory Visit: Payer: Self-pay | Admitting: Internal Medicine

## 2023-10-20 ENCOUNTER — Ambulatory Visit: Payer: BC Managed Care – PPO | Admitting: Internal Medicine

## 2023-10-20 ENCOUNTER — Encounter: Payer: Self-pay | Admitting: Internal Medicine

## 2023-10-20 VITALS — BP 136/80 | HR 84 | Ht 66.0 in | Wt 184.0 lb

## 2023-10-20 DIAGNOSIS — K648 Other hemorrhoids: Secondary | ICD-10-CM

## 2023-10-20 DIAGNOSIS — K642 Third degree hemorrhoids: Secondary | ICD-10-CM | POA: Diagnosis not present

## 2023-10-20 NOTE — Patient Instructions (Signed)
HEMORRHOID BANDING PROCEDURE    FOLLOW-UP CARE   The procedure you have had should have been relatively painless since the banding of the area involved does not have nerve endings and there is no pain sensation.  The rubber band cuts off the blood supply to the hemorrhoid and the band may fall off as soon as 48 hours after the banding (the band may occasionally be seen in the toilet bowl following a bowel movement). You may notice a temporary feeling of fullness in the rectum which should respond adequately to plain Tylenol or Motrin.  Following the banding, avoid strenuous exercise that evening and resume full activity the next day.  A sitz bath (soaking in a warm tub) or bidet is soothing, and can be useful for cleansing the area after bowel movements.     To avoid constipation, take two tablespoons of natural wheat bran, natural oat bran, flax, Benefiber or any over the counter fiber supplement and increase your water intake to 7-8 glasses daily.    Unless you have been prescribed anorectal medication, do not put anything inside your rectum for two weeks: No suppositories, enemas, fingers, etc.  Occasionally, you may have more bleeding than usual after the banding procedure.  This is often from the untreated hemorrhoids rather than the treated one.  Don't be concerned if there is a tablespoon or so of blood.  If there is more blood than this, lie flat with your bottom higher than your head and apply an ice pack to the area. If the bleeding does not stop within a half an hour or if you feel faint, call our office at [phone] or go to the emergency room.  Problems are not common; however, if there is a substantial amount of bleeding, severe pain, chills, fever or difficulty passing urine (very rare) or other problems, you should call us at [phone] or report to the nearest emergency room.  Do not stay seated continuously for more than 2-3 hours for a day or two after the procedure.   Tighten your buttock muscles 10-15 times every two hours and take 10-15 deep breaths every 1-2 hours.  Do not spend more than a few minutes on the toilet if you cannot empty your bowel; instead re-visit the toilet at a later time.    

## 2023-10-20 NOTE — Progress Notes (Signed)
Hunter Edwards is a 67 year old male with a history of Crohn's colitis with prior fistula and perianal involvement diagnosed in 2011, GERD, steroid responsive dermatitis, A-fib with prior ablation and symptomatic hemorrhoids who is here to discuss hemorrhoidal banding  Symptomatic hemorrhoids for many years predominantly for him and his prolapse, grade 3.  He manually reduces them.  Rarely bleeds.  Does have some fecal smearing Bowel movements regular with the occasional need to strain but are solid to semisolid once daily in the morning  Symptoms prior to banding: Frequent hemorrhoidal prolapse   PROCEDURE NOTE:  The patient presents with symptomatic grade 3 internal hemorrhoids, requesting rubber band ligation of his hemorrhoidal disease.  All risks, benefits and alternative forms of therapy were described and informed consent was obtained.   The anorectum was pre-medicated with 0.125% nitroglycerin ointment The decision was made to band the RA internal hemorrhoid, and the [facility] O'Regan System was used to perform band ligation without complication.   Digital anorectal examination was then performed to assure proper positioning of the band, and to adjust the banded tissue as required.  The patient was discharged home without pain or other issues.  Dietary and behavioral recommendations were given and along with follow-up instructions.    The patient will return as scheduled for follow-up and possible additional banding as required. No complications were encountered and the patient tolerated the procedure well.

## 2023-11-22 ENCOUNTER — Encounter: Payer: Self-pay | Admitting: Internal Medicine

## 2023-11-22 ENCOUNTER — Encounter: Payer: BC Managed Care – PPO | Admitting: Internal Medicine

## 2023-11-22 ENCOUNTER — Ambulatory Visit: Payer: BC Managed Care – PPO | Admitting: Internal Medicine

## 2023-11-22 VITALS — BP 120/84 | HR 81 | Ht 66.0 in | Wt 187.2 lb

## 2023-11-22 DIAGNOSIS — K648 Other hemorrhoids: Secondary | ICD-10-CM

## 2023-11-22 NOTE — Progress Notes (Signed)
 Hunter Edwards is a 68 year old with a history of Crohn's colitis with prior fistula and perianal involvement diagnosed in 2011 but recently in remission, GERD, steroid responsive dermatitis, A-fib with prior ablation and symptomatic hemorrhoids here for repeat banding  Longstanding symptomatic hemorrhoids predominantly prolapse, grade 3 Rare bleeding  Symptoms prior to banding: Frequent hemorrhoidal prolapse  50% better after banding #1 per patient; no bleeding; he saw the rubber band fall off a day #9   PROCEDURE NOTE:  The patient presents with symptomatic grade 3 internal hemorrhoids, requesting rubber band ligation of his hemorrhoidal disease.  All risks, benefits and alternative forms of therapy were described and informed consent was obtained.   The anorectum was pre-medicated with 0.125% nitroglycerin ointment The decision was made to band the LL (RA banded at visit #1) internal hemorrhoid, and the [facility] O'Regan System was used to perform band ligation without complication.   Digital anorectal examination was then performed to assure proper positioning of the band, and to adjust the banded tissue as required.  The patient was discharged home without pain or other issues.  Dietary and behavioral recommendations were given and along with follow-up instructions.     The patient will return as scheduled for follow-up and possible additional banding as required. No complications were encountered and the patient tolerated the procedure well.

## 2023-11-22 NOTE — Patient Instructions (Signed)

## 2024-02-02 ENCOUNTER — Encounter: Payer: BC Managed Care – PPO | Admitting: Internal Medicine

## 2024-04-26 ENCOUNTER — Encounter: Payer: Self-pay | Admitting: Internal Medicine

## 2024-04-26 ENCOUNTER — Ambulatory Visit: Admitting: Internal Medicine

## 2024-04-26 VITALS — BP 150/88 | HR 82 | Ht 66.0 in | Wt 186.4 lb

## 2024-04-26 DIAGNOSIS — K648 Other hemorrhoids: Secondary | ICD-10-CM

## 2024-04-26 DIAGNOSIS — K642 Third degree hemorrhoids: Secondary | ICD-10-CM | POA: Diagnosis not present

## 2024-04-26 NOTE — Patient Instructions (Signed)
 HEMORRHOID BANDING PROCEDURE    FOLLOW-UP CARE   The procedure you have had should have been relatively painless since the banding of the area involved does not have nerve endings and there is no pain sensation.  The rubber band cuts off the blood supply to the hemorrhoid and the band may fall off as soon as 48 hours after the banding (the band may occasionally be seen in the toilet bowl following a bowel movement). You may notice a temporary feeling of fullness in the rectum which should respond adequately to plain Tylenol or Motrin.  Following the banding, avoid strenuous exercise that evening and resume full activity the next day.  A sitz bath (soaking in a warm tub) or bidet is soothing, and can be useful for cleansing the area after bowel movements.     To avoid constipation, take two tablespoons of natural wheat bran, natural oat bran, flax, Benefiber or any over the counter fiber supplement and increase your water intake to 7-8 glasses daily.    Unless you have been prescribed anorectal medication, do not put anything inside your rectum for two weeks: No suppositories, enemas, fingers, etc.  Occasionally, you may have more bleeding than usual after the banding procedure.  This is often from the untreated hemorrhoids rather than the treated one.  Dont be concerned if there is a tablespoon or so of blood.  If there is more blood than this, lie flat with your bottom higher than your head and apply an ice pack to the area. If the bleeding does not stop within a half an hour or if you feel faint, call our office at [phone] or go to the emergency room.  Problems are not common; however, if there is a substantial amount of bleeding, severe pain, chills, fever or difficulty passing urine (very rare) or other problems, you should call us at [phone] or report to the nearest emergency room.  Do not stay seated continuously for more than 2-3 hours for a day or two after the procedure.   Tighten your buttock muscles 10-15 times every two hours and take 10-15 deep breaths every 1-2 hours.  Do not spend more than a few minutes on the toilet if you cannot empty your bowel; instead re-visit the toilet at a later time.   Thank you for choosing me and [facility].  Dr.Jay Pyrtle

## 2024-04-27 NOTE — Progress Notes (Signed)
 Hunter Edwards is a 68 year old with a history of Crohn's colitis with prior fistula and perianal involvement diagnosed in 2011 but recently in remission, GERD, steroid responsive dermatitis, A-fib with prior ablation and symptomatic hemorrhoids here for repeat banding   Longstanding symptomatic hemorrhoids predominantly prolapse, grade 3 Rare bleeding   Symptoms prior to banding: Frequent hemorrhoidal prolapse   50% better after banding #1 per patient; no bleeding; not much benefit after second banding to the left lateral hemorrhoid   He is hopeful for further improvement as prolapse is always been on the right   PROCEDURE NOTE:  The patient presents with symptomatic grade 3 internal hemorrhoids, requesting rubber band ligation of his hemorrhoidal disease.  All risks, benefits and alternative forms of therapy were described and informed consent was obtained.   The anorectum was pre-medicated with 0.125% nitroglycerin ointment The decision was made to band the RP (RA at visit #1 and LL at visit #2) internal hemorrhoid, and the [facility] System was used to perform band ligation without complication.   Digital anorectal examination was then performed to assure proper positioning of the band, and to adjust the banded tissue as required.  The patient was discharged home without pain or other issues.  Dietary and behavioral recommendations were given and along with follow-up instructions.    The patient will return as needed for follow-up and possible additional banding as required. No complications were encountered and the patient tolerated the procedure well. We did discuss if prolapse persist despite hemorrhoidal banding that we could consider colorectal surgery referral for hemorrhoidectomy

## 2024-05-17 ENCOUNTER — Other Ambulatory Visit: Payer: Self-pay | Admitting: Internal Medicine

## 2024-06-06 NOTE — Progress Notes (Addendum)
 Hunter Edwards, male    DOB: February 14, 1956    MRN: [id number]   Brief patient profile:  89  yowm  quit smoking   1998  referred to pulmonary clinic in [place]  06/07/2024 by Dr Geofm  for wheezing and sob in setting of chrohn's dz since ? 2008   Onset of skin rashes / dx skin allergy to [place] (pts dx) around early 2000s [place]    [place] to [place]  noted w/in a few years onset rhinitis spring time better with clariton and allegra s lower resp symptoms until second covid shot  10/2020 and breathing down hill since then some better p albuterol  but rarely needing until early July 2025 up to 3 x daily   Pt not previously seen by PCCM service.     History of Present Illness  06/07/2024  Pulmonary/ 1st office eval/ Ethan Kasperski / [facility] Office  Chief Complaint  Patient presents with   Consult    Referred by Dr. Glade Geofm.  Pt c/o SOB and cough off and on over the past year- started when he was dx with PNA. He has had increased symptoms over the past 2 months. His cough is non prod. He gets winded sometimes at night and has to prop himself up.    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    Also denies any obvious fluctuation of symptoms with weather or environmental changes or other aggravating or alleviating factors except as outlined above   No unusual exposure hx or h/o childhood pna/ asthma or knowledge of premature birth.  Current Allergies, Complete Past Medical History, Past Surgical History, Family History,  and Social History were reviewed in [employer] record.  ROS  The following are not active complaints unless bolded Hoarseness, sore throat, dysphagia, dental problems, itching, sneezing,  nasal congestion or discharge of excess mucus or purulent secretions, ear ache,   fever, chills, sweats, unintended wt loss or wt gain, classically pleuritic or exertional cp,  orthopnea pnd or arm/hand swelling  or leg swelling, presyncope, palpitations, abdominal pain, anorexia, nausea, vomiting, diarrhea  or change in bowel habits or change in bladder habits, change in stools or change in urine, dysuria, hematuria,  rash, arthralgias, visual complaints, headache, numbness, weakness or ataxia or problems with walking or coordination,  change in mood or  memory.            Outpatient Medications Prior to Visit  Medication Sig Dispense Refill   albuterol  (VENTOLIN  HFA) 108 (90 Base) MCG/ACT inhaler Inhale 2 puffs into the lungs every 4 (four) hours as needed for wheezing or shortness of breath. 18 g 0   aspirin 81 MG tablet Take 81 mg by mouth daily.     Bee Pollen 550 MG CAPS Take 2 capsules by mouth daily.     loratadine (CLARITIN) 10 MG tablet Take by mouth.     losartan (  COZAAR) 25 MG tablet Take 1 tablet by mouth daily.     Multiple Vitamins-Minerals (MULTIVITAMIN MEN PO) Take by mouth.     pantoprazole  (PROTONIX ) 40 MG tablet TAKE 1 TABLET BY MOUTH TWICE A DAY (Patient taking differently: Take 40 mg by mouth daily.) 180 tablet 1   triamcinolone  ointment (KENALOG ) 0.5 % APPLY 1 APPLICATION TOPICALLY TWICE A DAY 30 g 0   No facility-administered medications prior to visit.    Past Medical History:  Diagnosis Date   Allergy    Anal fistula    Anorectal fistula    Arthritis    Atrial fibrillation (HCC) 11/15/2014   Benign hypertension    Cataract    Chest pain    Colitis    Contact dermatitis    Crohn's disease (HCC) 11/15/2009   Disease of jaw    Dysrhythmia 01/14/2015    AFib   Fatigue    Folliculitis    GERD (gastroesophageal reflux disease)    History of Holter monitoring    Will be done on Friday, 01/17/15   Hyperlipidemia    IDA (iron deficiency anemia)    Need for Tdap vaccination    Neoplasm of prostate    Palpitations    Perirectal abscess    Rash    Rectal discharge       Objective:     BP (!) 140/80   Pulse 82   Ht 5' 6 (1.676 m)   Wt 186 lb (84.4 kg)   SpO2 97%   BMI 30.02 kg/m   SpO2: 97 %  RA  Somber amb wm nad    HEENT : Oropharynx  nl      Nasal turbinates nl    NECK :  without  apparent JVD/ palpable Nodes/TM    LUNGS: no acc muscle use,  Nl contour chest with mild insp/exp rhonchi in bases    CV:  RRR  no s3 or murmur or increase in P2, and no edema   ABD:  soft and nontender   MS:  Gait nl   ext warm without deformities Or obvious joint restrictions  calf tenderness, cyanosis or clubbing    SKIN: warm and dry with eczematous rash both wrists /forearms   NEURO:  alert, approp, nl sensorium with  no motor or cerebellar deficits apparent.      CXR PA and Lateral:   06/07/2024 :    I personally reviewed images and impression is as follows:     Slt increase markings L base noted previously note  07/14/22 and not seen on prior or present cxr Assessment   Asthmatic bronchitis , chronic (HCC) Quit smoking 1998 with Onset of symptoms 2022 p 2nd covid shot in setting of chron's dz/immuno suppressives with persistent nonspecific densities in the L lung base on PA not seen on lateral - 06/07/2024  After extensive coaching inhaler device,  effectiveness =    80% (short ti for saba ) rec dulera 100 2bid and f/u in 6 weeks  - 06/07/2024   Walked on RA  x  3  lap(s) =  approx 450  ft  @ mod pace, stopped due to end study  with lowest 02 sats 95%   Over using saba chronically for wheezing represents difficult airways management  with DDX almost all start with A and  include Adherence, Ace Inhibitors, Acid Reflux, Active  Sinus Disease, Alpha 1 Antitripsin deficiency, Anxiety masquerading as Airways dz,  ABPA,  Allergy(esp in young), Aspiration (esp in  elderly), Adverse effects of meds,  Active smoking or vaping, A bunch of PE's (a small clot burden can't cause this syndrome unless there is already severe underlying pulm or vascular dz with poor reserve) plus two Bs  = Bronchiectasis and Beta blocker use..and one C= CHF   Main concerns are bolded:  Adherence is always the initial prime suspect and is a multilayered concern that requires a trust but verify approach in every patient - starting with knowing how to use medications, especially inhalers, correctly, keeping up with refills and understanding the fundamental difference between maintenance and prns vs those medications only taken for a very short course and then stopped and not refilled.  - see hfa teaching  Allergy/asthma > suggested by assoc with longstanding  eczema so  try duler 100 2bid and approp saba: Re SABA :  I spent extra time with pt today reviewing appropriate use of albuterol  for prn use on exertion with the following points: 1) saba is for relief of sob that does not improve by walking a slower pace or resting but rather if the pt does not improve after trying this first. 2) If the pt is convinced, as many are, that saba helps recover from activity faster then it's easy to tell if this is the case by re-challenging : ie stop, take the inhaler, then p 5 minutes try the exact same activity (intensity of workload) that just caused the symptoms and see if they are substantially diminished or not after saba 3) if there is an activity that reproducibly causes the symptoms, try the saba 15 min before the activity on alternate days   If in fact the saba really does help, then fine to continue to use it prn but advised may need to look closer at the maintenance regimen being used to achieve better control of airways disease with exertion.   ? Acid (or  non-acid) GERD > always difficult to exclude as up to 75% of pts in some series report no assoc GI/ Heartburn symptoms> rec max (24h)  acid suppression and diet restrictions/ reviewed and instructions given in writing.   ? Alpha one at > check phenotype next ov   ? Bronchiectasis suggested by cxr which would be common on Chron's on immunosuprresives> consider HRCT next ov    Discussed in detail all the  indications, usual  risks and alternatives  relative to the benefits with patient who agrees to proceed with Rx as outlined.      F/u 6 weeks with all meds in hand using a trust but verify approach to confirm accurate Medication  Reconciliation The principal here is that until we are certain that the  patients are doing what we've asked, it makes no sense to ask them to do more.  Each maintenance medication was reviewed in detail including emphasizing most importantly the difference between maintenance and prns and under what circumstances the prns are to be triggered using an action plan format where appropriate.  Total time for H and P, chart review, counseling, reviewing hfa device(s) , directly observing portions of ambulatory 02 saturation study/ and generating customized AVS unique to this office visit / same day charting = 48 min complex new pt eval                   Ozell America, MD 06/07/2024

## 2024-06-07 ENCOUNTER — Encounter: Payer: Self-pay | Admitting: Internal Medicine

## 2024-06-07 ENCOUNTER — Ambulatory Visit: Admitting: Internal Medicine

## 2024-06-07 ENCOUNTER — Ambulatory Visit (HOSPITAL_COMMUNITY)
Admission: RE | Admit: 2024-06-07 | Discharge: 2024-06-07 | Disposition: A | Discharge status: Home / Self Care | Source: Ambulatory Visit | Attending: Internal Medicine | Admitting: Internal Medicine

## 2024-06-07 VITALS — BP 140/80 | HR 82 | Ht 66.0 in | Wt 186.0 lb

## 2024-06-07 DIAGNOSIS — J45991 Cough variant asthma: Secondary | ICD-10-CM | POA: Diagnosis present

## 2024-06-07 DIAGNOSIS — Z87891 Personal history of nicotine dependence: Secondary | ICD-10-CM

## 2024-06-07 DIAGNOSIS — J4489 Other specified chronic obstructive pulmonary disease: Secondary | ICD-10-CM | POA: Insufficient documentation

## 2024-06-07 MED ORDER — MOMETASONE FURO-FORMOTEROL FUM 100-5 MCG/ACT IN AERO: INHALATION_SPRAY | RESPIRATORY_TRACT | 0 refills | Status: AC

## 2024-06-07 MED ORDER — FAMOTIDINE 20 MG PO TABS: ORAL_TABLET | ORAL | 11 refills | Status: DC

## 2024-06-07 NOTE — Patient Instructions (Signed)
 Plan A = Automatic = Always=    dulera 100 Take 2 puffs first thing in am and then another 2 puffs about 12 hours later.    Work on inhaler technique:  relax and gently blow all the way out then take a nice smooth full deep breath back in, triggering the inhaler at same time you start breathing in.  Hold breath in for at least  5 seconds if you can. Blow out [employer]  thru nose. Rinse and gargle with water when done.  If mouth or throat bother you at all,  try brushing teeth/gums/tongue with arm and hammer toothpaste/ make a slurry and gargle and spit out.       Plan B = Backup (to supplement plan A, not to replace it) Use your albuterol  inhaler as a rescue medication to be used if you can't catch your breath by resting or slowing your pace  or doing a relaxed purse lip breathing pattern.  - The less you use it, the better it will work when you need it. - Ok to use the inhaler up to 2 puffs  every 4 hours if you must but call for appointment if use goes up over your usual need - Don't leave home without it !!  (think of it like the spare tire or starter fluid for your car)      Pantoprazole  (protonix ) 40 mg   Take  30-60 min before first meal of the day and Pepcid  (famotidine )  20 mg after supper until return to office - this is the best way to tell whether stomach acid is contributing to your problem.   GERD (REFLUX)  is an extremely common cause of respiratory symptoms just like yours , many times with no obvious heartburn at all.    It can be treated with medication, but also with lifestyle changes including elevation of the head of your bed (ideally with 6 -8inch blocks under the headboard of your bed),  Smoking cessation, avoidance of late meals, excessive alcohol, and avoid fatty foods, chocolate, peppermint, colas, red wine, and acidic juices such as orange juice.  NO MINT OR MENTHOL PRODUCTS SO NO COUGH DROPS  USE SUGARLESS CANDY INSTEAD (Jolley ranchers or Stover's or Life Savers) or even  ice chips will also do - the key is to swallow to prevent all throat clearing. NO OIL BASED VITAMINS - use powdered substitutes.  Avoid fish oil when coughing.   Please remember to go to the  x-ray department  @  [facility] for your tests - we will call you with the results when they are available     Please schedule a follow up office visit in 6 weeks, call sooner if needed with all respiratory  medications /inhalers/ solutions in hand   .

## 2024-06-07 NOTE — Assessment & Plan Note (Addendum)
 Quit smoking 1998 with Onset of symptoms 2022 p 2nd covid shot in setting of chron's dz/immuno suppressives with persistent nonspecific densities in the L lung base on PA not seen on lateral - 06/07/2024  After extensive coaching inhaler device,  effectiveness =    80% (short ti for saba ) rec dulera 100 2bid and f/u in 6 weeks  - 06/07/2024   Walked on RA  x  3  lap(s) =  approx 450  ft  @ mod pace, stopped due to end study  with lowest 02 sats 95%   Over using saba chronically for wheezing represents difficult airways management  with DDX almost all start with A and  include Adherence, Ace Inhibitors, Acid Reflux, Active Sinus Disease, Alpha 1 Antitripsin deficiency, Anxiety masquerading as Airways dz,  ABPA,  Allergy(esp in young), Aspiration (esp in elderly), Adverse effects of meds,  Active smoking or vaping, A bunch of PE's (a small clot burden can't cause this syndrome unless there is already severe underlying pulm or vascular dz with poor reserve) plus two Bs  = Bronchiectasis and Beta blocker use..and one C= CHF   Main concerns are bolded:  Adherence is always the initial prime suspect and is a multilayered concern that requires a trust but verify approach in every patient - starting with knowing how to use medications, especially inhalers, correctly, keeping up with refills and understanding the fundamental difference between maintenance and prns vs those medications only taken for a very short course and then stopped and not refilled.  - see hfa teaching  Allergy/asthma > suggested by assoc with longstanding  eczema so  try duler 100 2bid and approp saba: Re SABA :  I spent extra time with pt today reviewing appropriate use of albuterol  for prn use on exertion with the following points: 1) saba is for relief of sob that does not improve by walking a slower pace or resting but rather if the pt does not improve after trying this first. 2) If the pt is convinced, as many are, that saba helps  recover from activity faster then it's easy to tell if this is the case by re-challenging : ie stop, take the inhaler, then p 5 minutes try the exact same activity (intensity of workload) that just caused the symptoms and see if they are substantially diminished or not after saba 3) if there is an activity that reproducibly causes the symptoms, try the saba 15 min before the activity on alternate days   If in fact the saba really does help, then fine to continue to use it prn but advised may need to look closer at the maintenance regimen being used to achieve better control of airways disease with exertion.   ? Acid (or non-acid) GERD > always difficult to exclude as up to 75% of pts in some series report no assoc GI/ Heartburn symptoms> rec max (24h)  acid suppression and diet restrictions/ reviewed and instructions given in writing.   ? Alpha one at > check phenotype next ov   ? Bronchiectasis suggested by cxr which would be common on Chron's on immunosuprresives> consider HRCT next ov    Discussed in detail all the  indications, usual  risks and alternatives  relative to the benefits with patient who agrees to proceed with Rx as outlined.      F/u 6 weeks with all meds in hand using a trust but verify approach to confirm accurate Medication  Reconciliation The principal here is that until  we are certain that the  patients are doing what we've asked, it makes no sense to ask them to do more.  Each maintenance medication was reviewed in detail including emphasizing most importantly the difference between maintenance and prns and under what circumstances the prns are to be triggered using an action plan format where appropriate.  Total time for H and P, chart review, counseling, reviewing hfa device(s) , directly observing portions of ambulatory 02 saturation study/ and generating customized AVS unique to this office visit / same day charting = 48 min complex new pt eval

## 2024-06-08 ENCOUNTER — Other Ambulatory Visit (HOSPITAL_COMMUNITY): Payer: Self-pay

## 2024-06-08 ENCOUNTER — Telehealth: Payer: Self-pay

## 2024-06-08 ENCOUNTER — Telehealth: Payer: Self-pay | Admitting: Internal Medicine

## 2024-06-08 MED ORDER — ALBUTEROL SULFATE HFA 108 (90 BASE) MCG/ACT IN AERS: 2.0000 | INHALATION_SPRAY | RESPIRATORY_TRACT | 0 refills | Status: DC | PRN

## 2024-06-08 NOTE — Telephone Encounter (Signed)
 Copied from CRM #[id number]. Topic: Clinical - Medication Refill >> Jun 08, 2024 12:34 PM Celestine F wrote: Medication: albuterol  (VENTOLIN  HFA) 108 (90 Base) MCG/ACT inhaler   Has the patient contacted their pharmacy? Yes; no refills left on the prescription. (Agent: If no, request that the patient contact the pharmacy for the refill. If patient does not wish to contact the pharmacy document the reason why and proceed with request.) (Agent: If yes, when and what did the pharmacy advise?)  This is the patient's preferred pharmacy:  [id number] - [place], [place] - [address] AT [facility] [address] [place] [address] Phone: [phone] Fax: [phone]  Is this the correct pharmacy for this prescription? Yes If no, delete pharmacy and type the correct one.   Has the prescription been filled recently? No  Is the patient out of the medication? Yes  Has the patient been seen for an appointment in the last year OR does the patient have an upcoming appointment? Yes  Can we respond through MyChart? Yes  Agent: Please be advised that Rx refills may take up to 3 business days. We ask that you follow-up with your pharmacy.

## 2024-06-08 NOTE — Telephone Encounter (Signed)
*  Pulm  Pharmacy Patient Advocate Encounter   Received notification from Pt Calls Messages that prior authorization for Dulera is required/requested.   Insurance verification completed.   The patient is insured through [facility] .   Per test claim:  Generic Advair Diskus, Generic Symbicort or Brand Ranell Beck is preferred by the insurance.  If suggested medication is appropriate, Please send in a new RX and discontinue this one. If not, please advise as to why it's not appropriate so that we may request a Prior Authorization. Please note, some preferred medications may still require a PA.  If the suggested medications have not been trialed and there are no contraindications to their use, the PA will not be submitted, as it will not be approved.   Generic Advair Diskus: $10.00 Generic Symbicort: $10.00 Brand Breo Ellipta: $50.00

## 2024-06-08 NOTE — Telephone Encounter (Signed)
 Copied from CRM [id number]. Topic: Clinical - Prescription Issue >> Jun 08, 2024 12:36 PM Celestine FALCON wrote: Reason for CRM: Pt stated he attempted to get mometasone -formoterol  (DULERA) 100-5 MCG/ACT AERO filled, but his insurance is not willing to cover the medication. Pt stated there were alternatives that they would cover: 1) Fluticasone, 2) Budesonide , 3) Breyna 160, 4) Wixela 250, or 5) Breo ellipta 100 as an alternative. Please assist and call the pt back at [phone] ok to leave a vm.

## 2024-06-08 NOTE — Telephone Encounter (Signed)
 Rx sent to preferred pharmacy and pt is aware. Nfn

## 2024-06-09 ENCOUNTER — Encounter: Payer: Self-pay | Admitting: Internal Medicine

## 2024-06-12 ENCOUNTER — Other Ambulatory Visit: Payer: Self-pay

## 2024-06-12 ENCOUNTER — Telehealth: Payer: Self-pay

## 2024-06-12 MED ORDER — BUDESONIDE-FORMOTEROL FUMARATE 80-4.5 MCG/ACT IN AERO: 2.0000 | INHALATION_SPRAY | Freq: Two times a day (BID) | RESPIRATORY_TRACT | 7 refills | Status: AC

## 2024-06-12 NOTE — Telephone Encounter (Signed)
 Dr Darlean insurance will not cover Dulera are any of these an option for this pt.  Generic Advair Diskus Generic Symbicort  Brand Breo Ellipta

## 2024-06-12 NOTE — Telephone Encounter (Signed)
 Change dulera to generic symbicort  80 Take 2 puffs first thing in am and then another 2 puffs about 12 hours later.

## 2024-06-12 NOTE — Telephone Encounter (Signed)
 Spoke with pt regarding the alternative inhaler, verbalized understanding. I sent rx to pharm. NFN

## 2024-06-12 NOTE — Telephone Encounter (Signed)
 Copied from CRM [id number]. Topic: Clinical - Prescription Issue >> Jun 11, 2024  2:02 PM Isabell A wrote: Reason for CRM: Patient states [name] is not covered by his insurance and is requesting an alternative.   Callback number: [phone]  Routed to Dr Darlean see phone note from 7-25

## 2024-06-12 NOTE — Telephone Encounter (Signed)
 See phone note

## 2024-06-14 ENCOUNTER — Ambulatory Visit: Payer: Self-pay | Admitting: Internal Medicine

## 2024-06-14 NOTE — Progress Notes (Signed)
 Spoke with pt's spouse, Vernelle, [place] per DPR and notified of results per Dr. Darlean. She verbalized understanding and denied any questions.

## 2024-07-12 ENCOUNTER — Ambulatory Visit: Admitting: Internal Medicine

## 2024-07-19 ENCOUNTER — Encounter: Payer: Self-pay | Admitting: Internal Medicine

## 2024-07-19 NOTE — Progress Notes (Unsigned)
 Subjective:    Patient ID: Hunter Edwards, male    DOB: 09/29/1956, 68 y.o.   MRN: [id number]     HPI Harry is here for a physical exam and his chronic medical problems.   Right hand first 4 fingers are numbness- started 2-3 weeks - he went to pick up something and had a sharp in his thenar process and the numbness started with first two fingers and then progressed to fingers 3- medial aspect of 4 finger.  It is constant.  No pain.     Medications and allergies reviewed with patient and updated if appropriate.  Current Outpatient Medications on File Prior to Visit  Medication Sig Dispense Refill   albuterol  (VENTOLIN  HFA) 108 (90 Base) MCG/ACT inhaler Inhale 2 puffs into the lungs every 4 (four) hours as needed for wheezing or shortness of breath. 18 g 0   aspirin 81 MG tablet Take 81 mg by mouth daily.     Bee Pollen 550 MG CAPS Take 2 capsules by mouth daily.     budesonide -formoterol  (SYMBICORT ) 80-4.5 MCG/ACT inhaler Inhale 2 puffs into the lungs 2 (two) times daily. 2 puffs first thing in am and then another 2 puffs 12 hours later 10.2 g 7   famotidine  (PEPCID ) 20 MG tablet One after supper 30 tablet 11   loratadine (CLARITIN) 10 MG tablet Take by mouth.     losartan (COZAAR) 25 MG tablet Take 1 tablet by mouth daily.     mometasone -formoterol  (DULERA) 100-5 MCG/ACT AERO Take 2 puffs first thing in am and then another 2 puffs about 12 hours later. 1 each 0   Multiple Vitamins-Minerals (MULTIVITAMIN MEN PO) Take by mouth.     pantoprazole  (PROTONIX ) 40 MG tablet TAKE 1 TABLET BY MOUTH TWICE A DAY (Patient taking differently: Take 40 mg by mouth daily.) 180 tablet 1   triamcinolone  ointment (KENALOG ) 0.5 % APPLY 1 APPLICATION TOPICALLY TWICE A DAY 30 g 0   No current facility-administered medications on file prior to visit.    Review of Systems  Constitutional:  Negative for fever.  Eyes:  Negative for visual disturbance.  Respiratory:  Positive for cough (occasional -  occ brings up phlegm), shortness of breath (chronic) and wheezing.   Cardiovascular:  Positive for palpitations (when he rolls onto his left side). Negative for chest pain and leg swelling.  Gastrointestinal:  Negative for abdominal pain, blood in stool, constipation and diarrhea.       No gerd  Genitourinary:  Negative for difficulty urinating, dysuria and hematuria.  Musculoskeletal:  Positive for arthralgias and back pain.  Skin:  Positive for rash (intermittent).  Neurological:  Negative for light-headedness and headaches.  Psychiatric/Behavioral:  Negative for dysphoric mood. The patient is not nervous/anxious.        Objective:   Vitals:   07/20/24 1320  BP: 132/70  Pulse: 75  Temp: 98 F (36.7 C)  SpO2: 94%   Filed Weights   07/20/24 1320  Weight: 188 lb (85.3 kg)   Body mass index is 30.34 kg/m.  BP Readings from Last 3 Encounters:  07/20/24 132/70  06/07/24 (!) 140/80  04/26/24 (!) 150/88    Wt Readings from Last 3 Encounters:  07/20/24 188 lb (85.3 kg)  06/07/24 186 lb (84.4 kg)  04/26/24 186 lb 6 oz (84.5 kg)      Physical Exam Constitutional: He appears well-developed and well-nourished. No distress.  HENT:  Head: Normocephalic and atraumatic.  Right Ear: External ear  normal.  Left Ear: External ear normal.  Normal ear canals and TM b/l  Mouth/Throat: Oropharynx is clear and moist. Eyes: Conjunctivae and EOM are normal.  Neck: Neck supple. No tracheal deviation present. No thyromegaly present.  No carotid bruit  Cardiovascular: Normal rate, regular rhythm, normal heart sounds and intact distal pulses.   No murmur heard.  No lower extremity edema. Pulmonary/Chest: Effort normal and breath sounds normal. No respiratory distress. He has no wheezes. He has no rales.  Abdominal: Soft. He exhibits no distension. There is no tenderness.  Genitourinary: deferred  Lymphadenopathy:   He has no cervical adenopathy.  Skin: Skin is warm and dry. He is not  diaphoretic.  Psychiatric: He has a normal mood and affect. His behavior is normal.         Assessment & Plan:   Physical exam: Screening blood work  ordered Exercise   active Weight  obese Substance abuse   none   Reviewed recommended immunizations.   Health Maintenance  Topic Date Due   Zoster Vaccines- Shingrix (1 of 2) Never done   Influenza Vaccine  06/15/2024   DTaP/Tdap/Td (2 - Td or Tdap) 06/17/2026   Colonoscopy  08/07/2026   Pneumococcal Vaccine: 50+ Years  Completed   Hepatitis C Screening  Completed   HPV VACCINES  Aged Out   Meningococcal B Vaccine  Aged Out   Hepatitis B Vaccines 19-59 Average Risk  Discontinued   COVID-19 Vaccine  Discontinued     See Problem List for Assessment and Plan of chronic medical problems.

## 2024-07-19 NOTE — Patient Instructions (Addendum)
 Blood work was ordered.       Medications changes include :   None    A referral was ordered and someone will call you to schedule an appointment.     Return in about 1 year (around 07/20/2025) for Physical Exam.   Health Maintenance, Male Adopting a healthy lifestyle and getting preventive care are important in promoting health and wellness. Ask your health care provider about: The right schedule for you to have regular tests and exams. Things you can do on your own to prevent diseases and keep yourself healthy. What should I know about diet, weight, and exercise? Eat a healthy diet  Eat a diet that includes plenty of vegetables, fruits, low-fat dairy products, and lean protein. Do not eat a lot of foods that are high in solid fats, added sugars, or sodium. Maintain a healthy weight Body mass index (BMI) is a measurement that can be used to identify possible weight problems. It estimates body fat based on height and weight. Your health care provider can help determine your BMI and help you achieve or maintain a healthy weight. Get regular exercise Get regular exercise. This is one of the most important things you can do for your health. Most adults should: Exercise for at least 150 minutes each week. The exercise should increase your heart rate and make you sweat (moderate-intensity exercise). Do strengthening exercises at least twice a week. This is in addition to the moderate-intensity exercise. Spend less time sitting. Even light physical activity can be beneficial. Watch cholesterol and blood lipids Have your blood tested for lipids and cholesterol at 69 years of age, then have this test every 5 years. You may need to have your cholesterol levels checked more often if: Your lipid or cholesterol levels are high. You are older than 69 years of age. You are at high risk for heart disease. What should I know about cancer screening? Many types of cancers can be detected  early and may often be prevented. Depending on your health history and family history, you may need to have cancer screening at various ages. This may include screening for: Colorectal cancer. Prostate cancer. Skin cancer. Lung cancer. What should I know about heart disease, diabetes, and high blood pressure? Blood pressure and heart disease High blood pressure causes heart disease and increases the risk of stroke. This is more likely to develop in people who have high blood pressure readings or are overweight. Talk with your health care provider about your target blood pressure readings. Have your blood pressure checked: Every 3-5 years if you are 68-42 years of age. Every year if you are 68 years old or older. If you are between the ages of 22 and 30 and are a current or former smoker, ask your health care provider if you should have a one-time screening for abdominal aortic aneurysm (AAA). Diabetes Have regular diabetes screenings. This checks your fasting blood sugar level. Have the screening done: Once every three years after age 24 if you are at a normal weight and have a low risk for diabetes. More often and at a younger age if you are overweight or have a high risk for diabetes. What should I know about preventing infection? Hepatitis B If you have a higher risk for hepatitis B, you should be screened for this virus. Talk with your health care provider to find out if you are at risk for hepatitis B infection. Hepatitis C Blood testing is recommended for:  Everyone born from [birth year] through 1966. Anyone with known risk factors for hepatitis C. Sexually transmitted infections (STIs) You should be screened each year for STIs, including gonorrhea and chlamydia, if: You are sexually active and are younger than 69 years of age. You are older than 69 years of age and your health care provider tells you that you are at risk for this type of infection. Your sexual activity has changed since  you were last screened, and you are at increased risk for chlamydia or gonorrhea. Ask your health care provider if you are at risk. Ask your health care provider about whether you are at high risk for HIV. Your health care provider may recommend a prescription medicine to help prevent HIV infection. If you choose to take medicine to prevent HIV, you should first get tested for HIV. You should then be tested every 3 months for as long as you are taking the medicine. Follow these instructions at home: Alcohol use Do not drink alcohol if your health care provider tells you not to drink. If you drink alcohol: Limit how much you have to 0-2 drinks a day. Know how much alcohol is in your drink. In the [place] one drink equals one 12 oz bottle of beer (355 mL), one 5 oz glass of wine (148 mL), or one 1 oz glass of hard liquor (44 mL). Lifestyle Do not use any products that contain nicotine or tobacco. These products include cigarettes, chewing tobacco, and vaping devices, such as e-cigarettes. If you need help quitting, ask your health care provider. Do not use street drugs. Do not share needles. Ask your health care provider for help if you need support or information about quitting drugs. General instructions Schedule regular health, dental, and eye exams. Stay current with your vaccines. Tell your health care provider if: You often feel depressed. You have ever been abused or do not feel safe at home. Summary Adopting a healthy lifestyle and getting preventive care are important in promoting health and wellness. Follow your health care provider's instructions about healthy diet, exercising, and getting tested or screened for diseases. Follow your health care provider's instructions on monitoring your cholesterol and blood pressure. This information is not intended to replace advice given to you by your health care provider. Make sure you discuss any questions you have with your health care  provider. Document Revised: 03/23/2021 Document Reviewed: 03/23/2021 Elsevier Patient Education  2024 [employer].

## 2024-07-20 ENCOUNTER — Ambulatory Visit (INDEPENDENT_AMBULATORY_CARE_PROVIDER_SITE_OTHER): Admitting: Internal Medicine

## 2024-07-20 VITALS — BP 132/70 | HR 75 | Temp 98.0°F | Ht 66.0 in | Wt 188.0 lb

## 2024-07-20 DIAGNOSIS — K5010 Crohn's disease of large intestine without complications: Secondary | ICD-10-CM | POA: Diagnosis not present

## 2024-07-20 DIAGNOSIS — R200 Anesthesia of skin: Secondary | ICD-10-CM

## 2024-07-20 DIAGNOSIS — Z8679 Personal history of other diseases of the circulatory system: Secondary | ICD-10-CM | POA: Diagnosis not present

## 2024-07-20 DIAGNOSIS — E782 Mixed hyperlipidemia: Secondary | ICD-10-CM | POA: Diagnosis not present

## 2024-07-20 DIAGNOSIS — Z125 Encounter for screening for malignant neoplasm of prostate: Secondary | ICD-10-CM

## 2024-07-20 DIAGNOSIS — K219 Gastro-esophageal reflux disease without esophagitis: Secondary | ICD-10-CM

## 2024-07-20 DIAGNOSIS — R7303 Prediabetes: Secondary | ICD-10-CM

## 2024-07-20 DIAGNOSIS — I10 Essential (primary) hypertension: Secondary | ICD-10-CM

## 2024-07-20 DIAGNOSIS — Z0000 Encounter for general adult medical examination without abnormal findings: Secondary | ICD-10-CM

## 2024-07-20 LAB — COMPREHENSIVE METABOLIC PANEL WITH GFR
ALT: 18 U/L (ref 0–53)
AST: 20 U/L (ref 0–37)
Albumin: 4 g/dL (ref 3.5–5.2)
Alkaline Phosphatase: 75 U/L (ref 39–117)
BUN: 14 mg/dL (ref 6–23)
CO2: 26 meq/L (ref 19–32)
Calcium: 9 mg/dL (ref 8.4–10.5)
Chloride: 100 meq/L (ref 96–112)
Creatinine, Ser: 1.14 mg/dL (ref 0.40–1.50)
GFR: 65.81 mL/min (ref >60.00)
Glucose, Bld: 84 mg/dL (ref 70–99)
Potassium: 4.3 meq/L (ref 3.5–5.1)
Sodium: 137 meq/L (ref 135–145)
Total Bilirubin: 1.2 mg/dL (ref 0.2–1.2)
Total Protein: 6.9 g/dL (ref 6.0–8.3)

## 2024-07-20 LAB — LIPID PANEL
Cholesterol: 201 mg/dL — ABNORMAL HIGH (ref 0–200)
HDL: 65.4 mg/dL (ref >39.00)
LDL Cholesterol: 116 mg/dL — ABNORMAL HIGH (ref 0–99)
NonHDL: 135.37
Total CHOL/HDL Ratio: 3
Triglycerides: 96 mg/dL (ref 0.0–149.0)
VLDL: 19.2 mg/dL (ref 0.0–40.0)

## 2024-07-20 LAB — CBC WITH DIFFERENTIAL/PLATELET
Basophils Absolute: 0.1 K/uL (ref 0.0–0.1)
Basophils Relative: 0.7 % (ref 0.0–3.0)
Eosinophils Absolute: 0.3 K/uL (ref 0.0–0.7)
Eosinophils Relative: 3.1 % (ref 0.0–5.0)
HCT: 39.4 % (ref 39.0–52.0)
Hemoglobin: 13.6 g/dL (ref 13.0–17.0)
Lymphocytes Relative: 22 % (ref 12.0–46.0)
Lymphs Abs: 2.1 K/uL (ref 0.7–4.0)
MCHC: 34.5 g/dL (ref 30.0–36.0)
MCV: 87.1 fl (ref 78.0–100.0)
Monocytes Absolute: 0.8 K/uL (ref 0.1–1.0)
Monocytes Relative: 8.8 % (ref 3.0–12.0)
Neutro Abs: 6.2 K/uL (ref 1.4–7.7)
Neutrophils Relative %: 65.4 % (ref 43.0–77.0)
Platelets: 255 K/uL (ref 150.0–400.0)
RBC: 4.52 Mil/uL (ref 4.22–5.81)
RDW: 13 % (ref 11.5–15.5)
WBC: 9.4 K/uL (ref 4.0–10.5)

## 2024-07-20 LAB — PSA, MEDICARE: PSA: 0.68 ng/mL (ref 0.10–4.00)

## 2024-07-20 LAB — VITAMIN B12: Vitamin B-12: 432 pg/mL (ref 211–911)

## 2024-07-20 LAB — TSH: TSH: 0.86 u[IU]/mL (ref 0.35–5.50)

## 2024-07-20 LAB — VITAMIN D 25 HYDROXY (VIT D DEFICIENCY, FRACTURES): VITD: 63.67 ng/mL (ref 30.00–100.00)

## 2024-07-20 LAB — HEMOGLOBIN A1C: Hgb A1c MFr Bld: 6.2 % (ref 4.6–6.5)

## 2024-07-20 NOTE — Assessment & Plan Note (Signed)
 Chronic Check lipid panel  Lab Results  Component Value Date   LDLCALC 116 (H) 07/20/2024   Continue lifestyle controlled Regular exercise and healthy diet encouraged Check lipid panel, TSH, CMP

## 2024-07-20 NOTE — Assessment & Plan Note (Signed)
Chronic BP well controlled Continue losartan 25 mg daily Cmp, cbc

## 2024-07-20 NOTE — Assessment & Plan Note (Signed)
 Chronic GERD controlled Continue protonix  40 mg daily, Pepcid  20 mg daily

## 2024-07-20 NOTE — Assessment & Plan Note (Signed)
 H/o afib s/p ablation No obvious recurrence, but has had some palpitations and will discuss with cardiology Follows with cardio On ASA 81 mg daily

## 2024-07-20 NOTE — Assessment & Plan Note (Signed)
 New Started having numbness into his right thumb and index finger after experiencing a sharp pain in his thenar process-the numbness progressed to the third finger and half of the fourth finger Has been constant since then No pain, weakness Radial nerve entrapment-?  Cause Referral to [facility]

## 2024-07-20 NOTE — Assessment & Plan Note (Signed)
 Chronic Following with GI Controlled-asymptomatic Colonoscopy up-to-date Currently not on any medication

## 2024-07-21 ENCOUNTER — Ambulatory Visit: Payer: Self-pay | Admitting: Internal Medicine

## 2024-07-26 ENCOUNTER — Encounter: Payer: Self-pay | Admitting: Internal Medicine

## 2024-07-26 ENCOUNTER — Ambulatory Visit: Admitting: Internal Medicine

## 2024-07-26 VITALS — BP 150/93 | HR 68 | Ht 66.0 in | Wt 187.4 lb

## 2024-07-26 DIAGNOSIS — J4489 Other specified chronic obstructive pulmonary disease: Secondary | ICD-10-CM | POA: Diagnosis not present

## 2024-07-26 DIAGNOSIS — J45991 Cough variant asthma: Secondary | ICD-10-CM

## 2024-07-26 NOTE — Patient Instructions (Signed)
 No change in recommendations but if you are doing great in the non- allergy seasons ok to leave off the doses of symbicort  when you go to bed  Please schedule a follow up visit in 12 months but call sooner if needed

## 2024-07-26 NOTE — Progress Notes (Signed)
 Hunter Edwards, male    DOB: 1956/01/20    MRN: [id number]   Brief patient profile:  72  yowm  quit smoking   1998  referred to pulmonary clinic in [place]  06/07/2024 by Dr Geofm  for wheezing and sob in setting of chrohn's dz since ? 2008   Onset of skin rashes / dx skin allergy to [place] (pts dx) around early 2000s [place]    [place]  noted w/in a few years onset rhinitis spring time better with clariton and allegra s lower resp symptoms until second covid shot  10/2020 and breathing down hill since then some better p albuterol  but rarely needing until early July 2025 up to 3 x daily   Pt not previously seen by PCCM service.     History of Present Illness  06/07/2024  Pulmonary/ 1st office eval/ Hunter Edwards / [facility] Office  Chief Complaint  Patient presents with   Consult    Referred by Dr. Glade Geofm.  Pt c/o SOB and cough off and on over the past year- started when he was dx with PNA. He has had increased symptoms over the past 2 months. His cough is non prod. He gets winded sometimes at night and has to prop himself up.    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   Work on inhaler technique:   Plan B = Backup (to supplement plan A, not to replace it) Use your albuterol  inhaler as a rescue medication Pantoprazole  (protonix ) 40 mg   Take  30-60 min before first meal of the day and Pepcid  (famotidine )  20 mg after supper until return to office GERD  diet .    Please schedule a follow up office visit in 6 weeks, call sooner if needed with all  respiratory  medications /inhalers/ solutions in hand    07/26/2024  f/u ov/ office/Hunter Edwards re: AB maint on symbicort  80 2bid  did not  bring meds  Chief Complaint  Patient presents with   Shortness of Breath    F/u   Dyspnea:  Not limited by breathing from desired activities   Cough: none  Sleeping: bed blocks  and one pillow andno   resp cc  SABA use: not using  02: none   No obvious day to day or daytime variability or assoc excess/ purulent sputum or mucus plugs or hemoptysis or cp or chest tightness, subjective wheeze or overt sinus or hb symptoms.    Also denies any obvious fluctuation of symptoms with weather or environmental changes or other aggravating or alleviating factors except as outlined above   No unusual exposure hx or h/o childhood pna/ asthma or knowledge of premature birth.  Current Allergies, Complete Past Medical History, Past Surgical History, Family History, and Social History were reviewed in [employer] record.  ROS  The following are not active complaints unless bolded Hoarseness, sore throat, dysphagia, dental problems, itching, sneezing,  nasal congestion or discharge of excess mucus or purulent secretions, ear ache,   fever, chills, sweats, unintended wt loss or wt gain, classically pleuritic or exertional cp,  orthopnea pnd or arm/hand swelling  or leg swelling, presyncope, palpitations, abdominal pain, anorexia, nausea, vomiting, diarrhea  or change in bowel habits or change in bladder habits, change in stools or change in urine, dysuria, hematuria,  rash, arthralgias, visual complaints, headache, numbness, weakness or ataxia or problems with walking or coordination,  change in mood or  memory.        Current Meds  Medication Sig   albuterol  (VENTOLIN  HFA) 108 (90 Base) MCG/ACT inhaler Inhale 2 puffs into the lungs every 4 (four) hours as needed for wheezing or shortness of breath.   aspirin 81 MG tablet Take 81 mg by mouth  daily.   Bee Pollen 550 MG CAPS Take 2 capsules by mouth daily.   budesonide -formoterol  (SYMBICORT ) 80-4.5 MCG/ACT inhaler Inhale 2 puffs into the lungs 2 (two) times daily. 2 puffs first thing in am and then another 2 puffs 12 hours later   famotidine  (PEPCID ) 20 MG tablet One after supper   loratadine (CLARITIN) 10 MG tablet Take by mouth.   losartan (COZAAR) 25 MG tablet Take 1 tablet by mouth daily.   Multiple Vitamins-Minerals (MULTIVITAMIN MEN PO) Take by mouth.   pantoprazole  (PROTONIX ) 40 MG tablet TAKE 1 TABLET BY MOUTH TWICE A DAY (Patient taking differently: Take 40 mg by mouth daily.)   triamcinolone  ointment (KENALOG ) 0.5 % APPLY 1 APPLICATION TOPICALLY TWICE A DAY           Past Medical History:  Diagnosis Date   Allergy    Anal fistula    Anorectal fistula    Arthritis    Atrial fibrillation (HCC) 11/15/2014   Benign hypertension    Cataract    Chest pain    Colitis    Contact dermatitis    Crohn's disease (HCC) 11/15/2009   Disease of jaw    Dysrhythmia 01/14/2015   AFib   Fatigue    Folliculitis    GERD (gastroesophageal reflux disease)    History of Holter monitoring    Will be done on Friday, 01/17/15   Hyperlipidemia    IDA (iron deficiency anemia)    Need for Tdap vaccination    Neoplasm of prostate    Palpitations    Perirectal abscess    Rash    Rectal discharge     Objective:     Wt Readings from Last 3 Encounters:  07/26/24 187 lb 6.4 oz (85 kg)  07/20/24 188 lb (85.3 kg)  06/07/24 186 lb (84.4 kg)      Vital signs reviewed  07/26/2024  - Note at rest 02 sats  95% on RA   General appearance:    amb wm nad      HEENT : Oropharynx  clear      NECK :  without  apparent JVD/ palpable Nodes/TM    LUNGS: no acc muscle use,  Nl contour chest with minimal insp/exp rhonchi bilaterally  without cough on insp or exp maneuvers   CV:  RRR  no s3 or murmur or increase in P2, and no edema   ABD:  soft and nontender   MS:  Gait nl   ext warm  without deformities Or obvious joint restrictions  calf tenderness, cyanosis or clubbing    SKIN: warm and dry without lesions    NEURO:  alert, approp, nl sensorium  with  no motor or cerebellar deficits apparent.        Assessment   Assessment & Plan Cough variant asthma  Asthmatic bronchitis , chronic (HCC) Quit smoking 1998 with Onset of symptoms 2022 p 2nd covid shot in setting of chron's dz/immuno suppressives with persistent nonspecific densities in the L lung base on PA not seen on lateral - 06/07/2024  After extensive coaching inhaler device,  effectiveness =    80% (short ti for saba ) rec dulera 100 2bid and f/u in 6 weeks (changed to symb 80)  - 06/07/2024   Walked on RA  x  3  lap(s) =  approx 450  ft  @ mod pace, stopped due to end study  with lowest 02 sats 95%   All goals of chronic asthma control met including optimal function and elimination of symptoms with minimal need for rescue therapy.  Contingencies discussed in full including contacting this office immediately if not controlling the symptoms using the rule of two's.     Ok to take symbicort  80 prn Based on two studies from NEJM  378; 20 p 1865 (2018) and 380 : p2020-30 (2019) in pts with mild asthma it is reasonable to use low dose symbicort  eg 80 2bid prn flare in this setting but I emphasized this was only shown with symbicort  and takes advantage of the rapid onset of action but is not the same as rescue therapy but can be stopped once the acute symptoms have resolved and the need for rescue has been minimized (< 2 x weekly)           Each maintenance medication was reviewed in detail including emphasizing most importantly the difference between maintenance and prns and under what circumstances the prns are to be triggered using an action plan format where appropriate.  Total time for H and P, chart review, counseling, reviewing hfa  device(s) and generating customized AVS unique to this office visit / same  day charting = 25 min            AVS  Patient Instructions  No change in recommendations but if you are doing great in the non- allergy seasons ok to leave off the doses of symbicort  when you go to bed  Please schedule a follow up visit in 12 months but call sooner if needed    Ozell America, MD 07/26/2024

## 2024-07-26 NOTE — Assessment & Plan Note (Addendum)
 Quit smoking 1998 with Onset of symptoms 2022 p 2nd covid shot in setting of chron's dz/immuno suppressives with persistent nonspecific densities in the L lung base on PA not seen on lateral - 06/07/2024  After extensive coaching inhaler device,  effectiveness =    80% (short ti for saba ) rec dulera 100 2bid and f/u in 6 weeks (changed to symb 80)  - 06/07/2024   Walked on RA  x  3  lap(s) =  approx 450  ft  @ mod pace, stopped due to end study  with lowest 02 sats 95%   All goals of chronic asthma control met including optimal function and elimination of symptoms with minimal need for rescue therapy.  Contingencies discussed in full including contacting this office immediately if not controlling the symptoms using the rule of two's.     Ok to take symbicort  80 prn Based on two studies from NEJM  378; 20 p 1865 (2018) and 380 : p2020-30 (2019) in pts with mild asthma it is reasonable to use low dose symbicort  eg 80 2bid prn flare in this setting but I emphasized this was only shown with symbicort  and takes advantage of the rapid onset of action but is not the same as rescue therapy but can be stopped once the acute symptoms have resolved and the need for rescue has been minimized (< 2 x weekly)           Each maintenance medication was reviewed in detail including emphasizing most importantly the difference between maintenance and prns and under what circumstances the prns are to be triggered using an action plan format where appropriate.  Total time for H and P, chart review, counseling, reviewing hfa  device(s) and generating customized AVS unique to this office visit / same day charting = 25 min

## 2024-07-31 ENCOUNTER — Other Ambulatory Visit: Payer: Self-pay | Admitting: Internal Medicine

## 2024-07-31 ENCOUNTER — Other Ambulatory Visit: Payer: Self-pay

## 2024-07-31 DIAGNOSIS — J45991 Cough variant asthma: Secondary | ICD-10-CM

## 2024-07-31 MED ORDER — FAMOTIDINE 20 MG PO TABS: ORAL_TABLET | ORAL | 4 refills | Status: AC

## 2024-09-24 ENCOUNTER — Other Ambulatory Visit: Payer: Self-pay | Admitting: Internal Medicine

## 2024-09-26 ENCOUNTER — Other Ambulatory Visit: Payer: Self-pay

## 2024-09-26 ENCOUNTER — Encounter: Payer: Self-pay | Admitting: Internal Medicine

## 2024-09-26 MED ORDER — TRIAMCINOLONE ACETONIDE 0.5 % EX OINT: TOPICAL_OINTMENT | Freq: Two times a day (BID) | CUTANEOUS | 3 refills | Status: DC

## 2024-11-06 ENCOUNTER — Other Ambulatory Visit: Payer: Self-pay

## 2024-11-06 MED ORDER — TRIAMCINOLONE ACETONIDE 0.5 % EX OINT: TOPICAL_OINTMENT | Freq: Two times a day (BID) | CUTANEOUS | 3 refills | Status: AC
